# Patient Record
Sex: Male | Born: 1940 | Race: Black or African American | Hispanic: No | Marital: Married | Smoking: Never smoker
Health system: Southern US, Community
[De-identification: ages and names within clinical notes are randomized; demographics above are authoritative.]

## PROBLEM LIST (undated history)

## (undated) DIAGNOSIS — C801 Malignant (primary) neoplasm, unspecified: Secondary | ICD-10-CM

## (undated) DIAGNOSIS — F0280 Dementia in other diseases classified elsewhere without behavioral disturbance: Secondary | ICD-10-CM

## (undated) DIAGNOSIS — G309 Alzheimer's disease, unspecified: Secondary | ICD-10-CM

## (undated) DIAGNOSIS — I2510 Atherosclerotic heart disease of native coronary artery without angina pectoris: Secondary | ICD-10-CM

## (undated) DIAGNOSIS — N400 Enlarged prostate without lower urinary tract symptoms: Secondary | ICD-10-CM

## (undated) DIAGNOSIS — F0390 Unspecified dementia without behavioral disturbance: Secondary | ICD-10-CM

## (undated) DIAGNOSIS — I10 Essential (primary) hypertension: Secondary | ICD-10-CM

## (undated) HISTORY — PX: HERNIA REPAIR: SHX51

## (undated) HISTORY — PX: CORONARY ANGIOPLASTY WITH STENT PLACEMENT: SHX49

---

## 2015-06-27 ENCOUNTER — Observation Stay
Admission: EM | Admit: 2015-06-27 | Discharge: 2015-06-29 | Disposition: A | Discharge status: Home / Self Care | Payer: Medicare Other | Attending: Internal Medicine | Admitting: Internal Medicine

## 2015-06-27 ENCOUNTER — Emergency Department: Payer: Medicare Other

## 2015-06-27 ENCOUNTER — Encounter: Payer: Self-pay | Admitting: Urgent Care

## 2015-06-27 DIAGNOSIS — R0789 Other chest pain: Secondary | ICD-10-CM | POA: Diagnosis not present

## 2015-06-27 DIAGNOSIS — Z7982 Long term (current) use of aspirin: Secondary | ICD-10-CM | POA: Insufficient documentation

## 2015-06-27 DIAGNOSIS — R001 Bradycardia, unspecified: Secondary | ICD-10-CM | POA: Diagnosis not present

## 2015-06-27 DIAGNOSIS — E876 Hypokalemia: Secondary | ICD-10-CM | POA: Diagnosis not present

## 2015-06-27 DIAGNOSIS — Z8249 Family history of ischemic heart disease and other diseases of the circulatory system: Secondary | ICD-10-CM | POA: Diagnosis not present

## 2015-06-27 DIAGNOSIS — G8929 Other chronic pain: Secondary | ICD-10-CM | POA: Diagnosis not present

## 2015-06-27 DIAGNOSIS — R079 Chest pain, unspecified: Secondary | ICD-10-CM | POA: Diagnosis present

## 2015-06-27 DIAGNOSIS — Z880 Allergy status to penicillin: Secondary | ICD-10-CM | POA: Diagnosis not present

## 2015-06-27 DIAGNOSIS — Z79899 Other long term (current) drug therapy: Secondary | ICD-10-CM | POA: Diagnosis not present

## 2015-06-27 DIAGNOSIS — E785 Hyperlipidemia, unspecified: Secondary | ICD-10-CM | POA: Insufficient documentation

## 2015-06-27 DIAGNOSIS — I10 Essential (primary) hypertension: Secondary | ICD-10-CM | POA: Diagnosis not present

## 2015-06-27 DIAGNOSIS — F0390 Unspecified dementia without behavioral disturbance: Secondary | ICD-10-CM | POA: Insufficient documentation

## 2015-06-27 DIAGNOSIS — Z955 Presence of coronary angioplasty implant and graft: Secondary | ICD-10-CM | POA: Insufficient documentation

## 2015-06-27 DIAGNOSIS — R918 Other nonspecific abnormal finding of lung field: Secondary | ICD-10-CM | POA: Insufficient documentation

## 2015-06-27 DIAGNOSIS — M25559 Pain in unspecified hip: Secondary | ICD-10-CM | POA: Diagnosis not present

## 2015-06-27 DIAGNOSIS — N400 Enlarged prostate without lower urinary tract symptoms: Secondary | ICD-10-CM | POA: Insufficient documentation

## 2015-06-27 DIAGNOSIS — R55 Syncope and collapse: Secondary | ICD-10-CM | POA: Diagnosis not present

## 2015-06-27 DIAGNOSIS — I2510 Atherosclerotic heart disease of native coronary artery without angina pectoris: Secondary | ICD-10-CM | POA: Diagnosis not present

## 2015-06-27 DIAGNOSIS — J929 Pleural plaque without asbestos: Secondary | ICD-10-CM | POA: Diagnosis present

## 2015-06-27 HISTORY — DX: Unspecified dementia, unspecified severity, without behavioral disturbance, psychotic disturbance, mood disturbance, and anxiety: F03.90

## 2015-06-27 HISTORY — DX: Benign prostatic hyperplasia without lower urinary tract symptoms: N40.0

## 2015-06-27 HISTORY — DX: Atherosclerotic heart disease of native coronary artery without angina pectoris: I25.10

## 2015-06-27 HISTORY — DX: Essential (primary) hypertension: I10

## 2015-06-27 LAB — CBC
HCT: 39.3 % — ABNORMAL LOW (ref 40.0–52.0)
Hemoglobin: 13.2 g/dL (ref 13.0–18.0)
MCH: 30.3 pg (ref 26.0–34.0)
MCHC: 33.6 g/dL (ref 32.0–36.0)
MCV: 90.1 fL (ref 80.0–100.0)
Platelets: 143 10*3/uL — ABNORMAL LOW (ref 150–440)
RBC: 4.36 MIL/uL — ABNORMAL LOW (ref 4.40–5.90)
RDW: 13.6 % (ref 11.5–14.5)
WBC: 6.1 10*3/uL (ref 3.8–10.6)

## 2015-06-27 LAB — BASIC METABOLIC PANEL
Anion gap: 5 (ref 5–15)
BUN: 17 mg/dL (ref 6–20)
CO2: 29 mmol/L (ref 22–32)
Calcium: 8.9 mg/dL (ref 8.9–10.3)
Chloride: 104 mmol/L (ref 101–111)
Creatinine, Ser: 1.21 mg/dL (ref 0.61–1.24)
GFR calc Af Amer: >60 mL/min (ref >60)
GFR calc non Af Amer: 57 mL/min — ABNORMAL LOW (ref >60)
Glucose, Bld: 99 mg/dL (ref 65–99)
Potassium: 3.5 mmol/L (ref 3.5–5.1)
Sodium: 138 mmol/L (ref 135–145)

## 2015-06-27 LAB — TROPONIN I
Troponin I: <0.03 ng/mL (ref <0.031)
Troponin I: <0.03 ng/mL (ref <0.031)
Troponin I: <0.03 ng/mL (ref <0.031)
Troponin I: <0.03 ng/mL (ref <0.031)

## 2015-06-27 LAB — LIPID PANEL
Cholesterol: 153 mg/dL (ref 0–200)
HDL: 50 mg/dL (ref >40)
LDL Cholesterol: 96 mg/dL (ref 0–99)
Total CHOL/HDL Ratio: 3.1 RATIO
Triglycerides: 35 mg/dL (ref <150)
VLDL: 7 mg/dL (ref 0–40)

## 2015-06-27 IMAGING — CR DG CHEST 2V
2 series · 2 of 2 positions shown · non-contrast
Comparison: None.

CLINICAL DATA: Acute onset of retrosternal chest pain, radiating to
the left side of the chest and left arm. Initial encounter.

EXAM:
CHEST  2 VIEW

[chest pa]
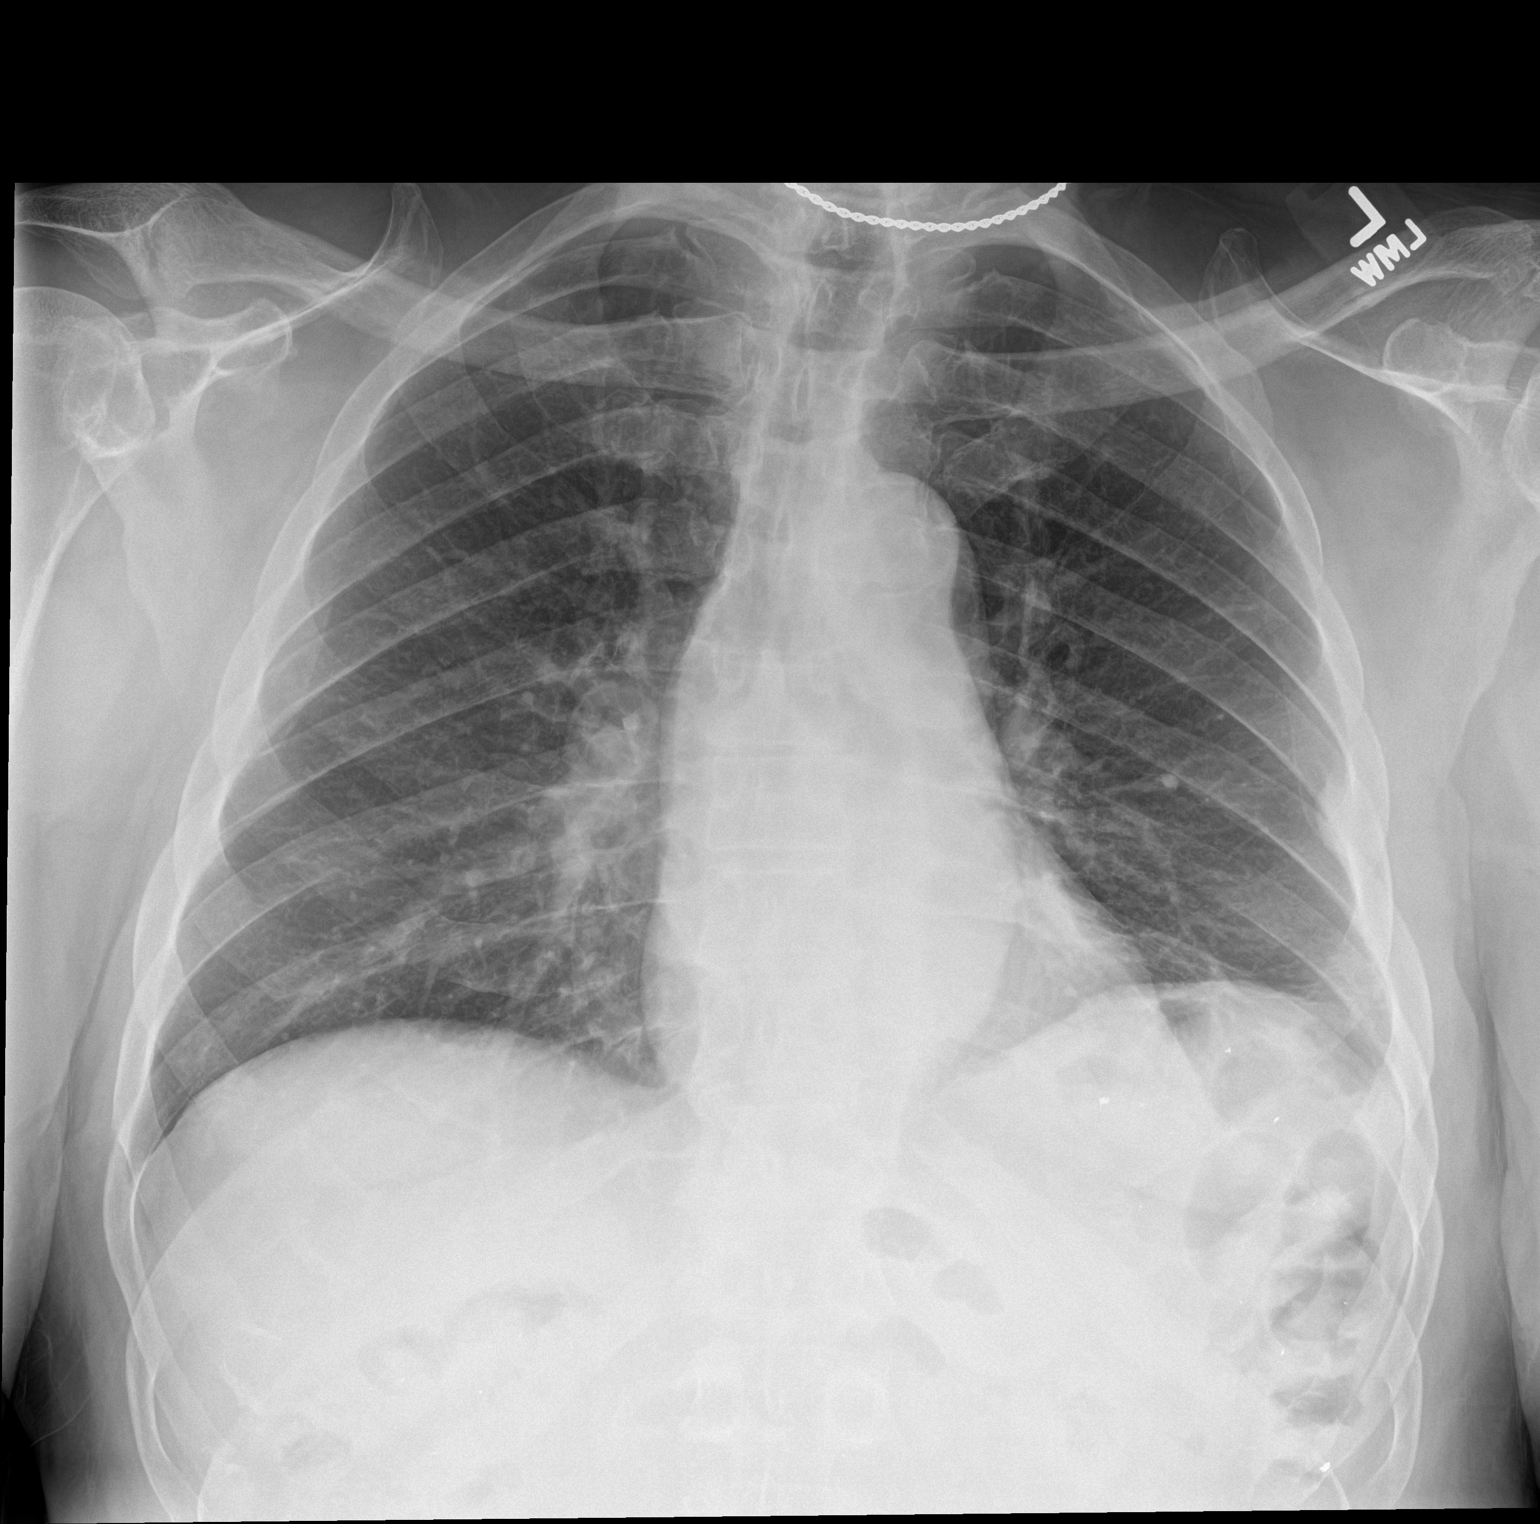

[chest lat]
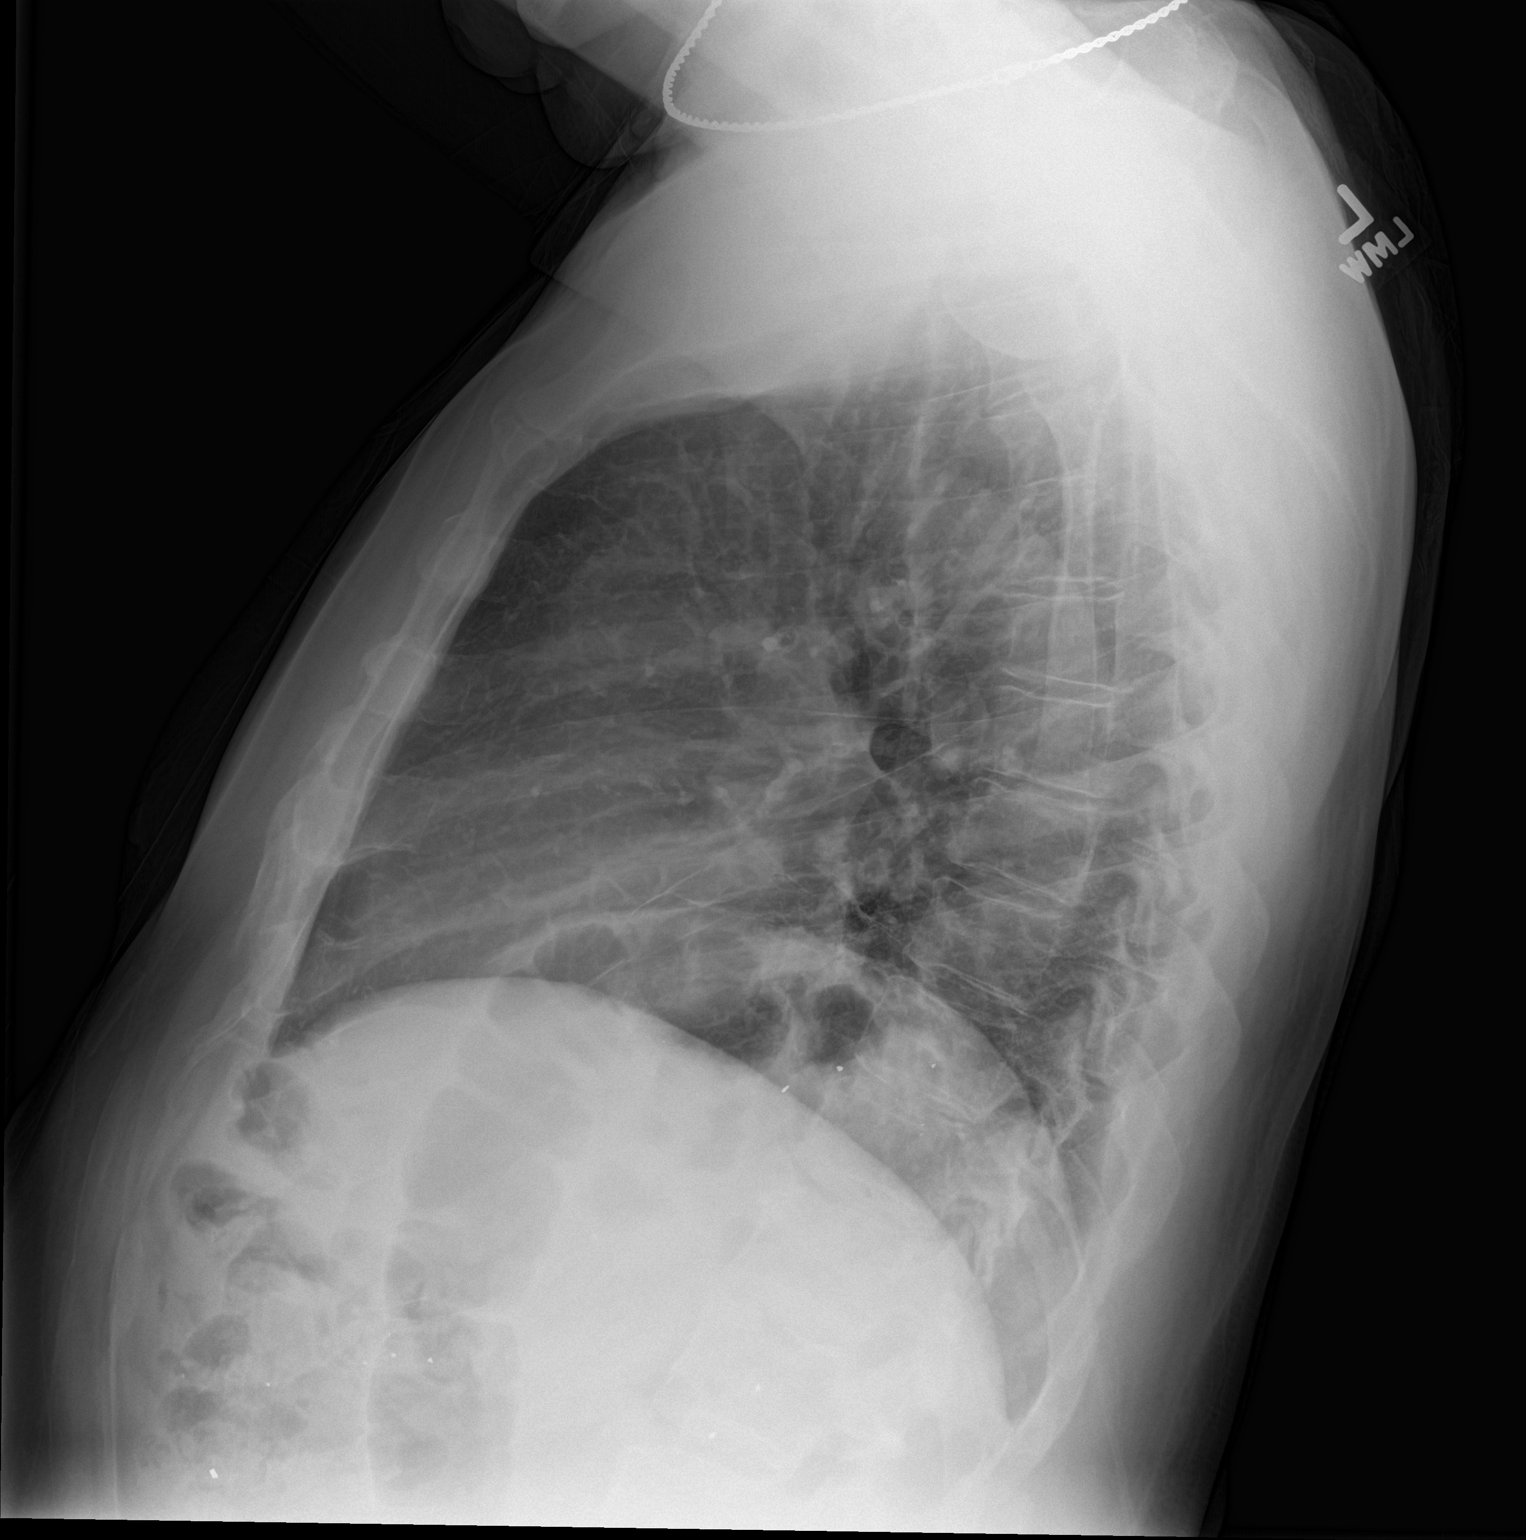

[2 of 2 positions shown; findings below may reference images not displayed]

FINDINGS: The lungs are well-aerated. There is no evidence of pleural effusion
or pneumothorax. Mild focal pleural thickening is noted at the left
midlung, of uncertain significance.

The heart is normal in size; the mediastinal contour is within
normal limits. No acute osseous abnormalities are seen.
IMPRESSION: Mild focal pleural thickening at the left midlung, of uncertain
significance. Would correlate for prior asbestos exposure, and
consider CT of the chest on an elective nonemergent basis, as deemed
clinically appropriate.

## 2015-06-27 MED ORDER — ASPIRIN EC 81 MG PO TBEC
81.0000 mg | DELAYED_RELEASE_TABLET | Freq: Every day | ORAL | Status: DC
  Administered 2015-06-27 – 2015-06-28 (×2): 81 mg via ORAL
  Filled 2015-06-27 (×2): qty 1

## 2015-06-27 MED ORDER — AMLODIPINE BESYLATE 10 MG PO TABS
10.0000 mg | ORAL_TABLET | Freq: Every day | ORAL | Status: DC
  Administered 2015-06-27 – 2015-06-28 (×2): 10 mg via ORAL
  Filled 2015-06-27 (×2): qty 1

## 2015-06-27 MED ORDER — POTASSIUM CHLORIDE CRYS ER 20 MEQ PO TBCR
20.0000 meq | EXTENDED_RELEASE_TABLET | Freq: Two times a day (BID) | ORAL | Status: DC
  Administered 2015-06-27 – 2015-06-28 (×2): 20 meq via ORAL
  Filled 2015-06-27 (×2): qty 1

## 2015-06-27 MED ORDER — TAMSULOSIN HCL 0.4 MG PO CAPS
0.4000 mg | ORAL_CAPSULE | Freq: Every day | ORAL | Status: DC
  Administered 2015-06-27 – 2015-06-28 (×2): 0.4 mg via ORAL
  Filled 2015-06-27 (×2): qty 1

## 2015-06-27 MED ORDER — PRAVASTATIN SODIUM 20 MG PO TABS
80.0000 mg | ORAL_TABLET | Freq: Every day | ORAL | Status: DC
  Administered 2015-06-27 – 2015-06-28 (×2): 80 mg via ORAL
  Filled 2015-06-27 (×2): qty 4

## 2015-06-27 MED ORDER — ASPIRIN 81 MG PO CHEW
324.0000 mg | CHEWABLE_TABLET | Freq: Once | ORAL | Status: AC
  Administered 2015-06-27: 324 mg via ORAL
  Filled 2015-06-27: qty 4

## 2015-06-27 MED ORDER — LISINOPRIL 20 MG PO TABS
40.0000 mg | ORAL_TABLET | Freq: Every day | ORAL | Status: DC
  Administered 2015-06-27 – 2015-06-28 (×2): 40 mg via ORAL
  Filled 2015-06-27 (×2): qty 2

## 2015-06-27 MED ORDER — DIPHENHYDRAMINE HCL 50 MG/ML IJ SOLN
INTRAMUSCULAR | Status: AC
  Administered 2015-06-27: 50 mg via INTRAVENOUS
  Filled 2015-06-27: qty 1

## 2015-06-27 MED ORDER — HYDRALAZINE HCL 20 MG/ML IJ SOLN: 10.0000 mg | Freq: Four times a day (QID) | INTRAMUSCULAR | Status: DC | PRN

## 2015-06-27 MED ORDER — NITROGLYCERIN 2 % TD OINT: 0.5000 [in_us] | TOPICAL_OINTMENT | Freq: Four times a day (QID) | TRANSDERMAL | Status: DC

## 2015-06-27 MED ORDER — HALOPERIDOL LACTATE 5 MG/ML IJ SOLN
5.0000 mg | Freq: Once | INTRAMUSCULAR | Status: AC
  Administered 2015-06-27: 5 mg via INTRAVENOUS
  Filled 2015-06-27: qty 1

## 2015-06-27 MED ORDER — DIPHENHYDRAMINE HCL 50 MG/ML IJ SOLN
50.0000 mg | Freq: Once | INTRAMUSCULAR | Status: AC
  Administered 2015-06-27: 50 mg via INTRAVENOUS

## 2015-06-27 MED ORDER — FUROSEMIDE 40 MG PO TABS
40.0000 mg | ORAL_TABLET | Freq: Every day | ORAL | Status: DC
  Administered 2015-06-27 – 2015-06-28 (×2): 40 mg via ORAL
  Filled 2015-06-27 (×2): qty 1

## 2015-06-27 MED ORDER — HALOPERIDOL LACTATE 5 MG/ML IJ SOLN
5.0000 mg | Freq: Once | INTRAMUSCULAR | Status: AC
  Administered 2015-06-27: 5 mg via INTRAVENOUS

## 2015-06-27 MED ORDER — ENOXAPARIN SODIUM 40 MG/0.4ML ~~LOC~~ SOLN
40.0000 mg | SUBCUTANEOUS | Status: DC
  Administered 2015-06-27 – 2015-06-28 (×2): 40 mg via SUBCUTANEOUS
  Filled 2015-06-27 (×2): qty 0.4

## 2015-06-27 MED ORDER — HALOPERIDOL LACTATE 5 MG/ML IJ SOLN
INTRAMUSCULAR | Status: AC
  Administered 2015-06-27: 5 mg via INTRAVENOUS
  Filled 2015-06-27: qty 1

## 2015-06-27 MED ORDER — METOPROLOL SUCCINATE ER 100 MG PO TB24
100.0000 mg | ORAL_TABLET | Freq: Every day | ORAL | Status: DC
  Administered 2015-06-27: 100 mg via ORAL
  Filled 2015-06-27 (×2): qty 1

## 2015-06-27 NOTE — Progress Notes (Signed)
[place] [facility]                                                                                                                                                                                            Patient Demographics   Alex [name], is a 75 y.o. male, DOB - 04-10-1940, [id number]  Admit date - 06/27/2015   Admitting Physician No admitting provider for patient encounter.  Outpatient Primary MD for the patient is No primary care provider on file.   LOS -   Subjective:Patient was admitted with chest pain and accelerated blood pressure. He was evaluated in the ER and wanted to go home. The ER physician commited him for danger to himself. He states that he is hungry denies any chest pain or shortness of breath     Review of Systems:   CONSTITUTIONAL: No documented fever. No fatigue, weakness. No weight gain, no weight loss.  EYES: No blurry or double vision.  ENT: No tinnitus. No postnasal drip. No redness of the oropharynx.  RESPIRATORY: No cough, no wheeze, no hemoptysis. No dyspnea.  CARDIOVASCULAR: Positive chest pain. No orthopnea. No palpitations. No syncope.  GASTROINTESTINAL: No nausea, no vomiting or diarrhea. No abdominal pain. No melena or hematochezia.  GENITOURINARY: No dysuria or hematuria.  ENDOCRINE: No polyuria or nocturia. No heat or cold intolerance.  HEMATOLOGY: No anemia. No bruising. No bleeding.  INTEGUMENTARY: No rashes. No lesions.  MUSCULOSKELETAL: No arthritis. No swelling. No gout.  NEUROLOGIC: No numbness, tingling, or ataxia. No seizure-type activity.  PSYCHIATRIC: No anxiety. No insomnia. No ADD.    Vitals:   Filed Vitals:   06/27/15 1030 06/27/15 1100 06/27/15 1307 06/27/15 1438  BP: 142/94 161/78 165/95 160/86  Pulse:  45 52 57  Resp:  12 11 12   Height:      Weight:      SpO2:  98% 97% 98%    Wt Readings from Last 3 Encounters:  06/27/15 92.987 kg (205 lb)    No intake or output data  in the 24 hours ending 06/27/15 1501  Physical Exam:   GENERAL: Pleasant-appearing in no apparent distress.  HEAD, EYES, EARS, NOSE AND THROAT: Atraumatic, normocephalic. Extraocular muscles are intact. Pupils equal and reactive to light. Sclerae anicteric. No conjunctival injection. No oro-pharyngeal erythema.  NECK: Supple. There is no jugular venous distention. No bruits, no lymphadenopathy, no thyromegaly.  HEART: Regular rate and rhythm,. No murmurs, no rubs, no clicks.  LUNGS: Clear to auscultation bilaterally. No rales or rhonchi. No wheezes.  ABDOMEN: Soft, flat, nontender, nondistended. Has good bowel sounds. No hepatosplenomegaly appreciated.  EXTREMITIES: No evidence of any cyanosis, clubbing, or peripheral edema.  +2 pedal and radial pulses bilaterally.  NEUROLOGIC: The patient is alert, awake, and oriented x3 with no focal motor or sensory deficits appreciated bilaterally.  SKIN: Moist and warm with no rashes appreciated.  Psych: Not anxious, depressed LN: No inguinal LN enlargement    Antibiotics   Anti-infectives    None      Medications   Scheduled Meds: . haloperidol lactate  5 mg Intravenous Once   Continuous Infusions:  PRN Meds:.   Data Review:   Micro Results No results found for this or any previous visit (from the past 240 hour(s)).  Radiology Reports Dg Chest 2 View  06/27/2015  CLINICAL DATA:  Acute onset of retrosternal chest pain, radiating to the left side of the chest and left arm. Initial encounter. EXAM: CHEST  2 VIEW COMPARISON:  None. FINDINGS: The lungs are well-aerated. There is no evidence of pleural effusion or pneumothorax. Mild focal pleural thickening is noted at the left midlung, of uncertain significance. The heart is normal in size; the mediastinal contour is within normal limits. No acute osseous abnormalities are seen. IMPRESSION: Mild focal pleural thickening at the left midlung, of uncertain significance. Would correlate for prior  asbestos exposure, and consider CT of the chest on an elective nonemergent basis, as deemed clinically appropriate. Electronically Signed   By: Garald Balding M.D.   On: 06/27/2015 03:00     CBC  Recent Labs Lab 06/27/15 0240  WBC 6.1  HGB 13.2  HCT 39.3*  PLT 143*  MCV 90.1  MCH 30.3  MCHC 33.6  RDW 13.6    Chemistries   Recent Labs Lab 06/27/15 0240  NA 138  K 3.5  CL 104  CO2 29  GLUCOSE 99  BUN 17  CREATININE 1.21  CALCIUM 8.9   ------------------------------------------------------------------------------------------------------------------ estimated creatinine clearance is 61.3 mL/min (by C-G formula based on Cr of 1.21). ------------------------------------------------------------------------------------------------------------------ No results for input(s): HGBA1C in the last 72 hours. ------------------------------------------------------------------------------------------------------------------ No results for input(s): CHOL, HDL, LDLCALC, TRIG, CHOLHDL, LDLDIRECT in the last 72 hours. ------------------------------------------------------------------------------------------------------------------ No results for input(s): TSH, T4TOTAL, T3FREE, THYROIDAB in the last 72 hours.  Invalid input(s): FREET3 ------------------------------------------------------------------------------------------------------------------ No results for input(s): VITAMINB12, FOLATE, FERRITIN, TIBC, IRON, RETICCTPCT in the last 72 hours.  Coagulation profile No results for input(s): INR, PROTIME in the last 168 hours.  No results for input(s): DDIMER in the last 72 hours.  Cardiac Enzymes  Recent Labs Lab 06/27/15 0240  TROPONINI <0.03   ------------------------------------------------------------------------------------------------------------------ Invalid input(s): POCBNP    Assessment & Plan   1). Chest pain Cardiac enzymes negative patient scheduled to have a  stress test in the morning. If negative can be discharged home tomorrow    2). Uncontrolled hypertension - Blood pressure was 171/90 on arrival improved - Continue amlodipine, metoprolol and lisinopril. - When necessary hydralazine for systolic blood pressure greater than 180. -May need adjustment on discharge  3). Bradycardia - Due to medication patient asymptomatic with that continue to monitor  4). Left Pleural Thickening - Follow up with PCP as outpatient for a possible CT scan   Code Status History    This patient does not have a recorded code status. Please follow your organizational policy for patients in this situation.           ConsultsNone DVT Prophylaxis  Lovenox   Lab Results  Component Value Date   PLT 143* 06/27/2015     Time Spent in minutes  58min  Greater than 50% of time spent in care coordination and counseling patient regarding the condition and plan of care.   Dustin Flock M.D on 06/27/2015 at 3:01 PM  Between 7am to 6pm - Pager - [phone]  After 6pm go to [email or web address] - password [email or web address] [facility]   Office  [phone]

## 2015-06-27 NOTE — ED Notes (Signed)
Pt observed sitting up in bed - NAD assessed  He will be waiting for a bed to be admitted  IVC

## 2015-06-27 NOTE — ED Notes (Signed)
Vegetarian meal provided - Pt observed with no unusual behavior  Appropriate to stimulation  No verbalized needs or concerns at this time  NAD assessed  Continue to monitor

## 2015-06-27 NOTE — ED Notes (Signed)
BEHAVIORAL HEALTH ROUNDING Patient sleeping: No. Patient alert and oriented: yes Behavior appropriate: Yes.  ; If no, describe:  Nutrition and fluids offered: yes Toileting and hygiene offered: Yes  Sitter present: q15 minute observations and security  monitoring Law enforcement present: Yes  ODS  

## 2015-06-27 NOTE — ED Notes (Signed)
Patient in bed sleeping at this time. 

## 2015-06-27 NOTE — Consult Note (Signed)
Overhead page Silver/Security alert for missing patient; patient is dealing with 'sundowners'/dementia related issues.  Security found patient wandering halls and returned him to room.  He is confused, disoriented and wants to leave [facility].  Nursing Supervisor contacted family for assistance.

## 2015-06-27 NOTE — ED Notes (Signed)

## 2015-06-27 NOTE — ED Notes (Addendum)
Family (grandson) at bedside., wanting staff to know that pt is vegetarian, pt grandson left list of numbers for family   Daughter Olegario Shearer Lindaman) [phone] Wife [name] lewis) [phone] Kristian Covey) [phone]

## 2015-06-27 NOTE — Progress Notes (Signed)
Pt. Arrived via stretcher was able to transfer self to bed without assistance. PT alert and oriented. Tele applied, verified with Janett Billow, CNA. General room orientation given. Pt. given instruction on how to use call bell and ascom system. Sitter at bedside. No behaviors noted. Skin assessment completed with Baldo Ash, RN. No skin issues noted. Pt. Mod assist. Will continue to monitor pt.

## 2015-06-27 NOTE — ED Notes (Signed)
Spoke with his daughter - POA  Update provided to her including the plan for admission - repeat troponins and what troponins are - daughter receptive to plan of care and I encouraged her to call at any time for updates

## 2015-06-27 NOTE — ED Notes (Signed)
Pt uprite on stretcher in exam room with no distress noted; pt reports onset mid CP radiating into left side and arm with no accomp symptoms; st hx of same with card stents; resp even/unlab, lungs clear, apical audible and regular; card monitor in place; male friend at bedside

## 2015-06-27 NOTE — ED Notes (Signed)
Pt brother at bedside.

## 2015-06-27 NOTE — Progress Notes (Signed)
Pt. Daughter arrived, pt. Allowed telemetry to be re-applied. Pt. Now in bed with sitter at bedside. Will continue to monitor pt.

## 2015-06-27 NOTE — Progress Notes (Signed)
Patient arrived to 2A. Vitals taken and tele verified with Gildardo Pounds, RN and Bertrum Sol, NT. IVC sitter is in room with patient. Will give report to night shift.

## 2015-06-27 NOTE — ED Notes (Signed)
Patient agitated and verbally aggressive in room, Dr. Owens Shark to IVC patient and placed medication orders.

## 2015-06-27 NOTE — ED Notes (Signed)
Report given to Kim, RN.

## 2015-06-27 NOTE — ED Notes (Signed)
Pt removed from ER room 7 to ER room 20, pt placed on cardiac monitor by this tech, tech will continue to monitor at this time.

## 2015-06-27 NOTE — H&P (Addendum)
[place] [facility] [place] NAME: Alex [name]    MR#:  [id number]  DATE OF BIRTH:  03/29/1941  DATE OF ADMISSION:  06/27/2015  PRIMARY CARE PHYSICIAN: No primary care provider on file.   REQUESTING/REFERRING PHYSICIAN: Dr Owens Shark  CHIEF COMPLAINT:   Chief Complaint  Patient presents with  . Chest Pain    HISTORY OF PRESENT ILLNESS: Alex [name]  is a 75 y.o. male with a known history of Coronary artery disease status post 3 stents with last stent placed about 4 years ago, hypertension and dementia. He is able to provide history in collaboration with his wife. He states that he developed chest pain while he was laying in bed doing nothing. Chest pain was retrosternal and radiated at 5/10 at its worse. There was no radiation and chest pain felt dull. Chest was continuous and has not identified aggravating or relieving factor. Wife states that at that time blood pressure was 170/86 which is high for him. Pulse was also 48 at that time. Chest pain lasted for a total of 2 hours before resolving spontaneously. Patient denies any associated symptoms and status: He denies any nausea, vomiting, abdominal pain, shortness of breath, cough, syncope, palpitations, dizziness or confusion. He denies any trauma to the chest and he is not bleeding from any orifice. At this time, he is chest pain-free. He denies any fever, recent travel or any changes in bowel or urine habits. He does not drink alcohol, smoke cigarettes or use recreational drugs.  On arrival in the ED, blood pressure was 171/90 and pulse was 48. CBC and CMP are unremarkable and troponin is negative. EKG showed sinus bradycardia at 50 and chest x-ray was negative. Patient was given aspirin in the ED and will be admitted for observation due to chest pain. He is full code  PAST MEDICAL HISTORY:   Past Medical History  Diagnosis Date  . Hypertension   . CAD (coronary artery disease)   . Dementia    . BPH (benign prostatic hyperplasia)     PAST SURGICAL HISTORY:  Past Surgical History  Procedure Laterality Date  . Coronary angioplasty with stent placement    . Hernia repair      SOCIAL HISTORY:  Social History  Substance Use Topics  . Smoking status: Never Smoker   . Smokeless tobacco: Not on file  . Alcohol Use: No    FAMILY HISTORY:  Family History  Problem Relation Age of Onset  . Hypertension Mother      DRUG ALLERGIES:  Allergies  Allergen Reactions  . Penicillins Other (See Comments)    Unknown reaction, childhood     REVIEW OF SYSTEMS:   CONSTITUTIONAL: No fever, fatigue or weakness.  EYES: No blurred or double vision.  EARS, NOSE, AND THROAT: No tinnitus or ear pain.  RESPIRATORY: No cough, shortness of breath, wheezing or hemoptysis.  CARDIOVASCULAR: As in history of present illness GASTROINTESTINAL: No nausea, vomiting, diarrhea or abdominal pain.  GENITOURINARY: No dysuria, hematuria.  ENDOCRINE: No polyuria, nocturia,  HEMATOLOGY: No anemia, easy bruising or bleeding SKIN: No rash or lesion. MUSCULOSKELETAL: No joint pain or arthritis.   NEUROLOGIC: No tingling, numbness, weakness.  PSYCHIATRY: No anxiety or depression.   MEDICATIONS AT HOME:  Prior to Admission medications   Not on File   Amlodipine 10mg  daily Aspirin 81 mg daily Lasix 40 Motrin daily Ibuprofen 800 mg every 8 hours when necessary Lisinopril 40 g daily Metoprolol 100 mg every 24  hours Potassium chloride 20 mEq twice a day Pravastatin 80 mg daily Tamsulosin 0.4 mg daily   PHYSICAL EXAMINATION:   VITAL SIGNS: Blood pressure 140/79, pulse 48, resp. rate 17, height 6\' 2"  (1.88 m), weight 92.987 kg (205 lb), SpO2 100 %.  GENERAL:  75 y.o.-year-old patient lying in the bed with no acute distress. Alert and oriented x 3. EYES: Pupils equal, round, reactive to light and accommodation. No scleral icterus. Extraocular muscles intact.  HEENT: Head atraumatic, normocephalic.  Oropharynx and nasopharynx clear.  NECK:  Supple, no jugular venous distention. No thyroid enlargement, no tenderness.  LUNGS: Normal breath sounds bilaterally, no wheezing, rales,rhonchi or crepitation. No use of accessory muscles of respiration.  CARDIOVASCULAR: Bradycardia, S1, S2 normal. No murmurs, rubs, or gallops.  ABDOMEN: Soft, nontender, nondistended. Bowel sounds present. No organomegaly or mass.  EXTREMITIES: No pedal edema, cyanosis, or clubbing.  NEUROLOGIC: Cranial nerves II through XII are intact. Muscle strength 5/5 in all extremities. Sensation intact. Gait not checked.   SKIN: No obvious rash, lesion, or ulcer.   LABORATORY PANEL:   CBC  Recent Labs Lab 06/27/15 0240  WBC 6.1  HGB 13.2  HCT 39.3*  PLT 143*  MCV 90.1  MCH 30.3  MCHC 33.6  RDW 13.6   ------------------------------------------------------------------------------------------------------------------  Chemistries   Recent Labs Lab 06/27/15 0240  NA 138  K 3.5  CL 104  CO2 29  GLUCOSE 99  BUN 17  CREATININE 1.21  CALCIUM 8.9   ------------------------------------------------------------------------------------------------------------------ estimated creatinine clearance is 61.3 mL/min (by C-G formula based on Cr of 1.21). ------------------------------------------------------------------------------------------------------------------ No results for input(s): TSH, T4TOTAL, T3FREE, THYROIDAB in the last 72 hours.  Invalid input(s): FREET3   Coagulation profile No results for input(s): INR, PROTIME in the last 168 hours. ------------------------------------------------------------------------------------------------------------------- No results for input(s): DDIMER in the last 72 hours. -------------------------------------------------------------------------------------------------------------------  Cardiac Enzymes  Recent Labs Lab 06/27/15 0240  TROPONINI <0.03    ------------------------------------------------------------------------------------------------------------------ Invalid input(s): POCBNP  ---------------------------------------------------------------------------------------------------------------  Urinalysis No results found for: COLORURINE, APPEARANCEUR, LABSPEC, PHURINE, GLUCOSEU, HGBUR, BILIRUBINUR, KETONESUR, PROTEINUR, UROBILINOGEN, NITRITE, LEUKOCYTESUR   RADIOLOGY: Dg Chest 2 View  06/27/2015  CLINICAL DATA:  Acute onset of retrosternal chest pain, radiating to the left side of the chest and left arm. Initial encounter. EXAM: CHEST  2 VIEW COMPARISON:  None. FINDINGS: The lungs are well-aerated. There is no evidence of pleural effusion or pneumothorax. Mild focal pleural thickening is noted at the left midlung, of uncertain significance. The heart is normal in size; the mediastinal contour is within normal limits. No acute osseous abnormalities are seen. IMPRESSION: Mild focal pleural thickening at the left midlung, of uncertain significance. Would correlate for prior asbestos exposure, and consider CT of the chest on an elective nonemergent basis, as deemed clinically appropriate. Electronically Signed   By: Garald Balding M.D.   On: 06/27/2015 03:00    EKG: Orders placed or performed during the hospital encounter of 06/27/15  . EKG 12-Lead  . EKG 12-Lead  . ED EKG within 10 minutes  . ED EKG within 10 minutes    ASSESSMENT  Principal Problem:   Chest pain Active Problems:   Uncontrolled hypertension   Bradycardia   Pleural thickening  PLAN   1). Chest pain - Patient presents with a retrosternal continuous chest pain that is nonradiating and is not associated with other symptoms. Patient is currently chest pain-free. Due to his history of coronary artery disease status post 3 stents, he is admitted for observation. - EKG only showed sinus  bradycardia at 50 and troponin is negative. Chest x-ray is also negative. -  Patient received aspirin and we have started patient on nitroglycerin. Continue metoprolol, Pravastatin and lisinopril. - Cycle troponin. If 3 sets are negative, patient will obtain a stress test prior to discharge. He is currently nothing by mouth - Check lipid panel and A1c.  2). Uncontrolled hypertension - Blood pressure was 171/90 on arrival but this is improved to 145/82. - Continue amlodipine, metoprolol and lisinopril. - When necessary hydralazine for systolic blood pressure greater than 180.  3). Bradycardia - Patient is on 100 mg daily of metoprolol. This is likely the etiology of bradycardia. Asymptomatic - Continue to monitor heart rate. I have reassured patient and his wife  4). Left Pleural Thickening - Follow up with PCP as outpatient for a possible CT scan  All the records are reviewed and case discussed with ED provider. Management plans discussed with the patient, family and they are in agreement.  CODE STATUS: Code Status History    This patient does not have a recorded code status. Please follow your organizational policy for patients in this situation.      I have independently reviewed all EKG and chest x-ray data  VTE prophylaxis: Lovenox if no contraindications and patient at low risk for bleeding. SCD's and progressive ambulation if patient has contraindications to anticoagulants. No DVT prophylaxis if patient presently receiving therapeutic anticoagulation or is at significant risk of bleeding for which the risk of anticoagulation outweigh the potential benefits.  Vaccinations: Pneumonia & flu vaccine per hospital protocol Prevention: Will proceed with conservative measures for the prevention of delirium in patients older than 65. Fall precautions and 1:1 sitter as needed per hospital protocol.  TOTAL TIME TAKING CARE OF THIS PATIENT: 40 minutes.    Sylvan Cheese M.D on 06/27/2015 at 4:38 AM  Between 7am to 6pm - Pager - [phone]  After 6pm [name] to  [email or web address] - password [email or web address] [facility]  [facility]  Office  [phone]  CC: Primary care physician; No primary care provider on file.

## 2015-06-27 NOTE — Progress Notes (Signed)
Pt. Pleasantly confused, will not stay in room, keeps exit seeking, he does have a sitter. Refuses to wear his tele, central tele notified. Daughter Ms. Sunga called and asked to come to hospital to speak with pt. She was also given room phone number to call.  MD paged to inform of pt's refusal to wear telemetry, awaiting call back. Will continue to monitor pt.

## 2015-06-27 NOTE — ED Notes (Signed)
Report called to [facility] RN on 2A

## 2015-06-27 NOTE — ED Notes (Signed)
Patient attempted to leave ER with IV still in arm and cardiac leads still attached. Patient escorted back to room by Dr. Owens Shark and [employer].

## 2015-06-27 NOTE — ED Notes (Addendum)
Admitting MD at the bedside for pt evaluation.  

## 2015-06-27 NOTE — ED Notes (Signed)
Patient presents with a 2 hour c/o retrosternal CP with (+) radiation into LEFT side of chest. Patient denies N/V, SOB, and diaphoresis. Patient reports that he is having significant pain in his LUE. Of note, patient's family member reporting that patient has Alzheimer's, however states, "You wouldn't know it" - concerned that HPI may be limited due to his dementia. Patient CAO x 4 in triage.

## 2015-06-27 NOTE — ED Provider Notes (Signed)
[facility] Emergency Department Provider Note  ____________________________________________  Time seen: 2:20 AM  I have reviewed the triage vital signs and the nursing notes.   HISTORY  Chief Complaint Chest Pain    HPI Alex [name] is a 75 y.o. male with history of coronary artery disease with 3 cardiac stent placements, hypertension presents with two-hour history of retrosternal chest pain leading to the left side of his chest that has since resolved. Patient states that the pain lasted approximately an hour and was "severe". Patient denies any dyspnea associated with the pain no diaphoresis nausea or vomiting. Patient's family member at bedside stated that she checked his pulse at home and noted it to be 48 patient's pulse on arrival to emergency department was 50.     Past Medical History  Diagnosis Date  . Hypertension   . CAD (coronary artery disease)     There are no active problems to display for this patient.   Past Surgical History  Procedure Laterality Date  . Coronary angioplasty with stent placement    . Hernia repair      No current outpatient prescriptions on file.  Allergies Penicillins  No family history on file.  Social History Social History  Substance Use Topics  . Smoking status: Never Smoker   . Smokeless tobacco: None  . Alcohol Use: No    Review of Systems  Constitutional: Negative for fever. Eyes: Negative for visual changes. ENT: Negative for sore throat. Cardiovascular: Positive for chest pain. Respiratory: Negative for shortness of breath. Gastrointestinal: Negative for abdominal pain, vomiting and diarrhea. Genitourinary: Negative for dysuria. Musculoskeletal: Negative for back pain. Skin: Negative for rash. Neurological: Negative for headaches, focal weakness or numbness.  10-point ROS otherwise negative.  ____________________________________________   PHYSICAL EXAM:  VITAL SIGNS: ED Triage  Vitals  Enc Vitals Group     BP 06/27/15 0238 171/90 mmHg     Pulse --      Resp 06/27/15 0238 15     Temp --      Temp src --      SpO2 --      Weight 06/27/15 0211 205 lb (92.987 kg)     Height 06/27/15 0211 6\' 2"  (1.88 m)     Head Cir --      Peak Flow --      Pain Score 06/27/15 0211 0     Pain Loc --      Pain Edu? --      Excl. in [place]? --     Constitutional: Alert and oriented. Well appearing and in no distress. Eyes: Conjunctivae are normal. PERRL. Normal extraocular movements. ENT   Head: Normocephalic and atraumatic.   Nose: No congestion/rhinnorhea.   Mouth/Throat: Mucous membranes are moist.   Neck: No stridor. Hematological/Lymphatic/Immunilogical: No cervical lymphadenopathy. Cardiovascular:Bradycardia. Normal and symmetric distal pulses are present in all extremities. No murmurs, rubs, or gallops. Respiratory: Normal respiratory effort without tachypnea nor retractions. Breath sounds are clear and equal bilaterally. No wheezes/rales/rhonchi. Gastrointestinal: Soft and nontender. No distention. There is no CVA tenderness. Genitourinary: deferred Musculoskeletal: Nontender with normal range of motion in all extremities. No joint effusions.  No lower extremity tenderness nor edema. Neurologic:  Normal speech and language. No gross focal neurologic deficits are appreciated. Speech is normal.  Skin:  Skin is warm, dry and intact. No rash noted. Psychiatric: Mood and affect are normal. Speech and behavior are normal. Patient exhibits appropriate insight and judgment.  ____________________________________________    LABS (  pertinent positives/negatives)  Labs Reviewed  BASIC METABOLIC PANEL - Abnormal; Notable for the following:    GFR calc non Af Amer 57 (*)    All other components within normal limits  CBC - Abnormal; Notable for the following:    RBC 4.36 (*)    HCT 39.3 (*)    Platelets 143 (*)    All other components within normal limits   TROPONIN I     ____________________________________________   EKG  ED ECG REPORT I, [place] N BROWN, the attending physician, personally viewed and interpreted this ECG.   Date: 06/27/2015  EKG Time: 9:10 PM  Rate: 50  Rhythm: Sinus bradycardia  Axis: Normal  Intervals: Normal  ST&T Change: None   ____________________________________________    RADIOLOGY  DG Chest 2 View (Final result) Result time: 06/27/15 03:00:28   Final result by Rad Results In Interface (06/27/15 03:00:28)   Narrative:   CLINICAL DATA: Acute onset of retrosternal chest pain, radiating to the left side of the chest and left arm. Initial encounter.  EXAM: CHEST 2 VIEW  COMPARISON: None.  FINDINGS: The lungs are well-aerated. There is no evidence of pleural effusion or pneumothorax. Mild focal pleural thickening is noted at the left midlung, of uncertain significance.  The heart is normal in size; the mediastinal contour is within normal limits. No acute osseous abnormalities are seen.  IMPRESSION: Mild focal pleural thickening at the left midlung, of uncertain significance. Would correlate for prior asbestos exposure, and consider CT of the chest on an elective nonemergent basis, as deemed clinically appropriate.   Electronically Signed By: Garald Balding M.D. On: 06/27/2015 03:00         INITIAL IMPRESSION / ASSESSMENT AND PLAN / ED COURSE  Pertinent labs & imaging results that were available during my care of the patient were reviewed by me and considered in my medical decision making (see chart for details).  Patient discussed with Dr. Pia Mau for hospital admission. Patient in agreement with plan for admission for further cardiac evaluation and management.  ----------------------------------------- 7:52 AM on 06/27/2015 -----------------------------------------  Patient became very irate and combative with staff attempting to walk out of the emergency  department with IV and hand wearing a gown. I was able to speak to the patient and de-escalate the situation return the patient to the room. However patient quickly became irate again attempting to leave. I spoke with the patient at length to assess capacity to to leave [place]. Patient is only oriented to self patient does not know what year or month it is. Patient also does not know the current present in the [place] is. Patient could not recall speaking with the earlier evaluation or speaking with the admitting physician who had spoken with him approximately 1-1/2 hours before this encounter. At this time I believe that the patient does not have the capacity to leave the emergency department [place] as such patient will be involuntarily committed.  ____________________________________________   FINAL CLINICAL IMPRESSION(S) / ED DIAGNOSES  Final diagnoses:  Chest pain, unspecified chest pain type      Gregor Hams, MD 06/27/15 [phone]

## 2015-06-27 NOTE — ED Notes (Signed)
Pt daughter at bedside

## 2015-06-27 NOTE — ED Notes (Addendum)
Patient placed in bed, cardiac monitor reapplied. Patient family member and caregiver at bedside, [employer] and [employer] officer outside of patients room.

## 2015-06-27 NOTE — ED Notes (Signed)
Pt provided urinal.

## 2015-06-28 ENCOUNTER — Observation Stay: Payer: Medicare Other

## 2015-06-28 LAB — URINALYSIS COMPLETE WITH MICROSCOPIC (ARMC ONLY)
Bacteria, UA: NONE SEEN
Bilirubin Urine: NEGATIVE
Glucose, UA: NEGATIVE mg/dL
Hgb urine dipstick: NEGATIVE
Ketones, ur: NEGATIVE mg/dL
Leukocytes, UA: NEGATIVE
Nitrite: NEGATIVE
Protein, ur: NEGATIVE mg/dL
Specific Gravity, Urine: 1.009 (ref 1.005–1.030)
Squamous Epithelial / LPF: NONE SEEN
pH: 6 (ref 5.0–8.0)

## 2015-06-28 LAB — NM MYOCAR MULTI W/SPECT W/WALL MOTION / EF
Estimated workload: 7.4 METS
Exercise duration (min): 6 min
Exercise duration (sec): 18 s
LV dias vol: 63 mL (ref 62–150)
LV sys vol: 29 mL
Peak HR: 95 {beats}/min
Rest HR: 53 {beats}/min
SDS: 1
SRS: 6
SSS: 1
TID: 0.83

## 2015-06-28 LAB — GLUCOSE, CAPILLARY: Glucose-Capillary: 108 mg/dL — ABNORMAL HIGH (ref 65–99)

## 2015-06-28 LAB — HEMOGLOBIN A1C: Hgb A1c MFr Bld: 5.4 % (ref 4.0–6.0)

## 2015-06-28 IMAGING — NM NM MYOCAR MULTI W/SPECT W/WALL MOTION & EF
11 series · 60 of 60 positions shown · non-contrast
Comparison: none

[Series 1000: raw stress (recon - noac ) · 4.8mm · 4.80mm/px · 5 of 33 frames shown]
[frame 3/33]
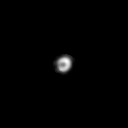
[frame 9/33]
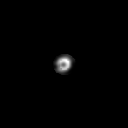
[frame 14/33]
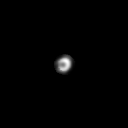
[frame 20/33]
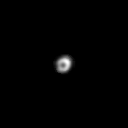
[frame 25/33]
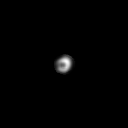

[Series 1000: raw stress (recon - ac ) · 4.8mm · 4.80mm/px · 6 of 33 frames shown]
[frame 3/33]
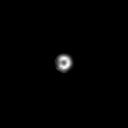
[frame 9/33]
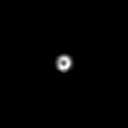
[frame 14/33]
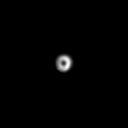
[frame 20/33]
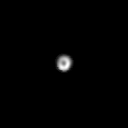
[frame 25/33]
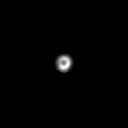
[frame 31/33]
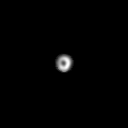

[Series 1000: raw rest (recon - ac ) · 4.8mm · 4.80mm/px · 5 of 30 frames shown]
[frame 3/30]
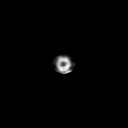
[frame 8/30]
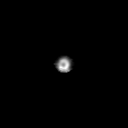
[frame 13/30]
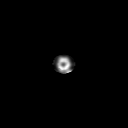
[frame 18/30]
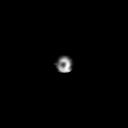
[frame 28/30]
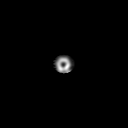

[Series 1000: raw stress-gated · 4.80mm/px · 6 of 544 frames shown]
[frame 46/544]
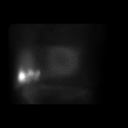
[frame 136/544]
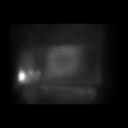
[frame 227/544]
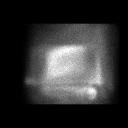
[frame 318/544]
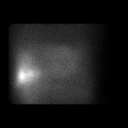
[frame 408/544]
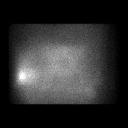
[frame 499/544]
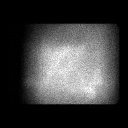

[Series 1000: raw stress_dc · 4.80mm/px · 5 of 68 frames shown]
[frame 6/68]
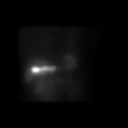
[frame 17/68]
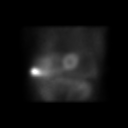
[frame 29/68]
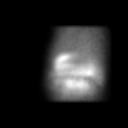
[frame 51/68]
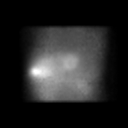
[frame 63/68]
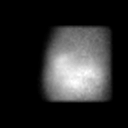

[Series 1000: raw stress-gated (recon) · 4.8mm · 4.80mm/px · 6 of 248 frames shown]
[frame 21/248]
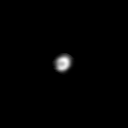
[frame 62/248]
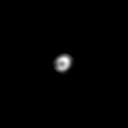
[frame 104/248]
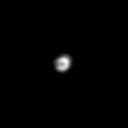
[frame 145/248]
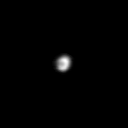
[frame 186/248]
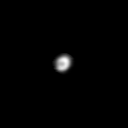
[frame 228/248]
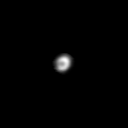

[Series 1000: raw stress-gated_dc · 4.80mm/px · 5 of 544 frames shown]
[frame 46/544]
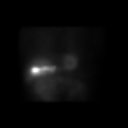
[frame 136/544]
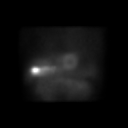
[frame 318/544]
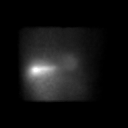
[frame 408/544]
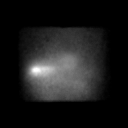
[frame 499/544]
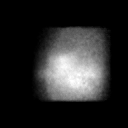

[Series 1000: raw rest_dc · 4.80mm/px · 6 of 68 frames shown]
[frame 6/68]
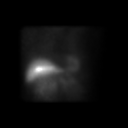
[frame 17/68]
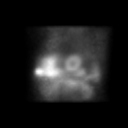
[frame 29/68]
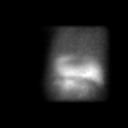
[frame 40/68]
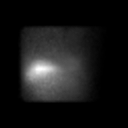
[frame 51/68]
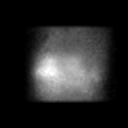
[frame 63/68]
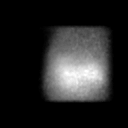

[Series 1000: raw rest (recon - noac ) · 4.8mm · 4.80mm/px · 5 of 35 frames shown]
[frame 3/35]
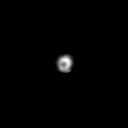
[frame 15/35]
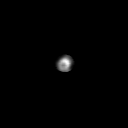
[frame 21/35]
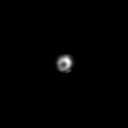
[frame 27/35]
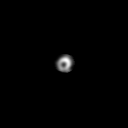
[frame 33/35]
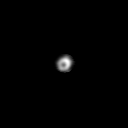

[Series 1000: raw rest · 4.80mm/px · 6 of 68 frames shown]
[frame 6/68]
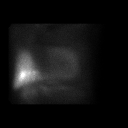
[frame 17/68]
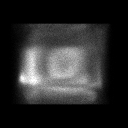
[frame 29/68]
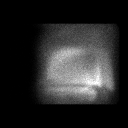
[frame 40/68]
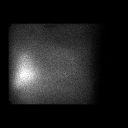
[frame 51/68]
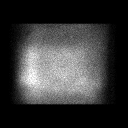
[frame 63/68]
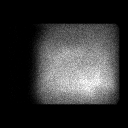

[Series 1000: raw stress · 4.80mm/px · 5 of 68 frames shown]
[frame 17/68]
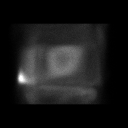
[frame 29/68]
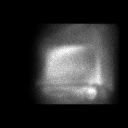
[frame 40/68]
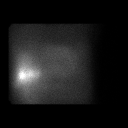
[frame 51/68]
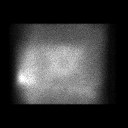
[frame 63/68]
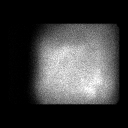

[60 of 60 positions shown; findings below may reference images not displayed]

Canned report from images found in remote index.

Refer to host system for actual result text.

## 2015-06-28 MED ORDER — TECHNETIUM TC 99M SESTAMIBI GENERIC - CARDIOLITE
30.0000 | Freq: Once | INTRAVENOUS | Status: AC | PRN
  Administered 2015-06-28: 32.91 via INTRAVENOUS

## 2015-06-28 MED ORDER — METOPROLOL SUCCINATE ER 50 MG PO TB24: 50.0000 mg | ORAL_TABLET | Freq: Every day | ORAL | Status: DC

## 2015-06-28 MED ORDER — SODIUM CHLORIDE 0.9 % IV SOLN
INTRAVENOUS | Status: DC
  Administered 2015-06-28 – 2015-06-29 (×2): via INTRAVENOUS

## 2015-06-28 MED ORDER — DONEPEZIL HCL 5 MG PO TABS
10.0000 mg | ORAL_TABLET | Freq: Every day | ORAL | Status: DC
  Administered 2015-06-28: 10 mg via ORAL
  Filled 2015-06-28: qty 2

## 2015-06-28 MED ORDER — TECHNETIUM TC 99M SESTAMIBI GENERIC - CARDIOLITE
14.0000 | Freq: Once | INTRAVENOUS | Status: AC | PRN
  Administered 2015-06-28: 13.44 via INTRAVENOUS

## 2015-06-28 MED ORDER — SODIUM CHLORIDE 0.9 % IV SOLN
Freq: Once | INTRAVENOUS | Status: AC
  Administered 2015-06-28: 15:00:00 via INTRAVENOUS

## 2015-06-28 NOTE — Progress Notes (Signed)
MD notified. Pts HR is 56 and metoprolol 100 mg scheduled. Orders to hold this a.m. Dose and MD will reassess need. I will continue to assess.

## 2015-06-28 NOTE — Significant Event (Signed)
Rapid Response Event Note  Overview:pt about to be discharged home, found to be cool and clammy all of the sudden, possible fainting spell       Initial Focused Assessment:pt moved back to bed, lethargic, alert and oriented, BP 108/67 and 123/63, SB 51 on monitor and 100% 02 sats on room air.   Interventions: Dr. Earleen Newport in room to assess pt, cancelled pt discharge, and pt will stay in room. Plan discussed with Malachy Mood supervisor, Dr Earleen Newport and Tammy ( pts nurse)   Event Summary:   at      at          [facility]

## 2015-06-28 NOTE — Progress Notes (Signed)
Pt has discharge order. When entered the room patient was not responding well. BP manual was 110/62. Pt was able to state name but became incoherant and slow to respond. BP by dinamap was 70s/40s, HR 54  and patient developed a cold sweat. CBG was >100. Rapid response called, MD notified. Order for 1Liter bolus. Patient will be monitored overnight. I will continue to assess. Patient will keep sitter for safety. Hx of dementia

## 2015-06-28 NOTE — Progress Notes (Signed)
Pt. In bed resting, pt. Prefers male nursing staff, he is more responsive to them. Male CNA sitting with pt. Will continue to monitor pt.

## 2015-06-28 NOTE — Progress Notes (Signed)
Patient ID: Alex [name], male   DOB: 06/04/1940, 75 y.o.   MRN: [id number] [facility] PROGRESS NOTE  Gotham Lenning [id number] DOB: [birth year] DOA: 06/27/2015 PCP: No primary care provider on file.  HPI/Subjective: Patient was seen earlier after negative stress test and again after a rapid response was called. Patient felt a little dizzy while he was getting dressed to go home. He got sweaty and hypotensive. No complaints of chest pain or shortness of breath. He was agreeable to be watched overnight again today.  Objective: Filed Vitals:   06/28/15 1634 06/28/15 1635  BP: 126/78 116/76  Pulse: 60 69  Temp:    Resp:      Filed Weights   06/27/15 0211 06/27/15 1904  Weight: 92.987 kg (205 lb) 96.163 kg (212 lb)    ROS: Review of Systems  Constitutional: Positive for diaphoresis. Negative for fever and chills.  Eyes: Negative for blurred vision.  Respiratory: Negative for cough and shortness of breath.   Cardiovascular: Negative for chest pain.  Gastrointestinal: Negative for nausea, vomiting, abdominal pain, diarrhea and constipation.  Genitourinary: Negative for dysuria.  Musculoskeletal: Negative for joint pain.  Neurological: Negative for dizziness and headaches.   Exam: Physical Exam  HENT:  Nose: No mucosal edema.  Mouth/Throat: No oropharyngeal exudate or posterior oropharyngeal edema.  Eyes: Conjunctivae, EOM and lids are normal. Pupils are equal, round, and reactive to light.  Neck: No JVD present. Carotid bruit is not present. No edema present. No thyroid mass and no thyromegaly present.  Cardiovascular: S1 normal and S2 normal.  Exam reveals no gallop.   No murmur heard. Pulses:      Dorsalis pedis pulses are 2+ on the right side, and 2+ on the left side.  Respiratory: No respiratory distress. He has no wheezes. He has no rhonchi. He has no rales.  GI: Soft. Bowel sounds are normal. There is no tenderness.  Musculoskeletal:       Right  ankle: He exhibits no swelling.       Left ankle: He exhibits no swelling.  Lymphadenopathy:    He has no cervical adenopathy.  Neurological: He is alert. No cranial nerve deficit.  Skin: Skin is warm. No rash noted. Nails show no clubbing.  Psychiatric: His mood appears anxious. He is agitated.      Data Reviewed: Basic Metabolic Panel:  Recent Labs Lab 06/27/15 0240  NA 138  K 3.5  CL 104  CO2 29  GLUCOSE 99  BUN 17  CREATININE 1.21  CALCIUM 8.9  CBC:  Recent Labs Lab 06/27/15 0240  WBC 6.1  HGB 13.2  HCT 39.3*  MCV 90.1  PLT 143*   Cardiac Enzymes:  Recent Labs Lab 06/27/15 0240 06/27/15 1527 06/27/15 1908 06/27/15 2249  TROPONINI <0.03 <0.03 <0.03 <0.03   CBG:  Recent Labs Lab 06/28/15 1406  GLUCAP 108*    Studies: Dg Chest 2 View  06/27/2015  CLINICAL DATA:  Acute onset of retrosternal chest pain, radiating to the left side of the chest and left arm. Initial encounter. EXAM: CHEST  2 VIEW COMPARISON:  None. FINDINGS: The lungs are well-aerated. There is no evidence of pleural effusion or pneumothorax. Mild focal pleural thickening is noted at the left midlung, of uncertain significance. The heart is normal in size; the mediastinal contour is within normal limits. No acute osseous abnormalities are seen. IMPRESSION: Mild focal pleural thickening at the left midlung, of uncertain significance. Would correlate for prior asbestos exposure, and consider CT of  the chest on an elective nonemergent basis, as deemed clinically appropriate. Electronically Signed   By: Garald Balding M.D.   On: 06/27/2015 03:00   Nm Myocar Multi W/spect W/wall Motion / Ef  06/28/2015   There was no ST segment deviation noted during stress.  The study is normal.  This is a low risk study.  The left ventricular ejection fraction is mildly decreased (45-54%).     Scheduled Meds: . aspirin EC  81 mg Oral Daily  . donepezil  10 mg Oral QHS  . enoxaparin (LOVENOX) injection  40  mg Subcutaneous Q24H  . pravastatin  80 mg Oral Daily  . tamsulosin  0.4 mg Oral Daily   Continuous Infusions: . sodium chloride 75 mL/hr at 06/28/15 1521    Assessment/Plan:  1. Likely vasovagal near syncope. I gave a fluid bolus and will continue fluids overnight and hold antihypertensive medications tomorrow we'll get orthostatic vital signs 2. Chest pain. Resolved negative stress test 3. Hyperlipidemia unspecified continue pravastatin 4. BPH continue Flomax 5. Dementia continue Aricept 6. Hip pain chronic  Code Status:     Code Status Orders        Start     Ordered   06/27/15 1938  Full code   Continuous     06/27/15 1938    Code Status History    Date Active Date Inactive Code Status Order ID Comments User Context   This patient has a current code status but no historical code status.     Family Communication: Spoke with ex-wife at the bedside and daughter on the phone Disposition Plan: Likely home tomorrow  Time spent: 35 minutes  Loletha Grayer  [facility] [place] [facility]

## 2015-06-29 LAB — BASIC METABOLIC PANEL
Anion gap: 8 (ref 5–15)
BUN: 16 mg/dL (ref 6–20)
CO2: 23 mmol/L (ref 22–32)
Calcium: 8.5 mg/dL — ABNORMAL LOW (ref 8.9–10.3)
Chloride: 107 mmol/L (ref 101–111)
Creatinine, Ser: 1.09 mg/dL (ref 0.61–1.24)
GFR calc Af Amer: >60 mL/min (ref >60)
GFR calc non Af Amer: >60 mL/min (ref >60)
Glucose, Bld: 101 mg/dL — ABNORMAL HIGH (ref 65–99)
Potassium: 3.2 mmol/L — ABNORMAL LOW (ref 3.5–5.1)
Sodium: 138 mmol/L (ref 135–145)

## 2015-06-29 LAB — CK: Total CK: 234 U/L (ref 49–397)

## 2015-06-29 MED ORDER — MAGNESIUM SULFATE 2 GM/50ML IV SOLN
2.0000 g | Freq: Once | INTRAVENOUS | Status: AC
  Administered 2015-06-29: 2 g via INTRAVENOUS
  Filled 2015-06-29: qty 50

## 2015-06-29 MED ORDER — FINASTERIDE 5 MG PO TABS: 5.0000 mg | ORAL_TABLET | Freq: Every day | ORAL | Status: DC

## 2015-06-29 MED ORDER — DONEPEZIL HCL 10 MG PO TABS: 10.0000 mg | ORAL_TABLET | Freq: Every day | ORAL | Status: DC

## 2015-06-29 MED ORDER — POTASSIUM CHLORIDE CRYS ER 20 MEQ PO TBCR
40.0000 meq | EXTENDED_RELEASE_TABLET | Freq: Once | ORAL | Status: AC
  Administered 2015-06-29: 40 meq via ORAL
  Filled 2015-06-29: qty 2

## 2015-06-29 NOTE — Discharge Summary (Signed)
[place] [facility] [place] NAME: Alex [name]    MR#:  [id number]  DATE OF BIRTH:  [birth year]  DATE OF ADMISSION:  06/27/2015 ADMITTING PHYSICIAN: Saundra Shelling, MD  DATE OF DISCHARGE: 06/29/2015 10:55 AM  PRIMARY CARE PHYSICIAN: No primary care provider on file.    ADMISSION DIAGNOSIS:  Chest pain [R07.9] Chest pain, unspecified chest pain type [R07.9]  DISCHARGE DIAGNOSIS:  Principal Problem:   Chest pain Active Problems:   Uncontrolled hypertension   Bradycardia   Pleural thickening   SECONDARY DIAGNOSIS:   Past Medical History  Diagnosis Date  . Hypertension   . CAD (coronary artery disease)   . Dementia   . BPH (benign prostatic hyperplasia)     HOSPITAL COURSE:   1. Atypical chest pain. Patient had a negative stress test and no further chest pain 2. Near syncopal event after the negative stress test where he became sweaty and hypotensive. We gave a fluid bolus and held all of his antihypertensive medications. In the a.m. he was still a little orthostatic but felt okay walking around and wanted to go home. I sent him home with close monitoring with his ex-wife. I held all of his antihypertensive medications and his Flomax. I advised hydration. 3. BPH. Switch to Proscar 4. Dementia continue Aricept 5. Chronic hip pain. 6. Hyperlipidemia continue pravastatin 7. History of CAD with negative stress test continue aspirin 8. Hypokalemia this was replaced. I also gave him IV magnesium prior to discharge home.  DISCHARGE CONDITIONS:   Satisfactory  CONSULTS OBTAINED:  None  DRUG ALLERGIES:   Allergies  Allergen Reactions  . Penicillins Other (See Comments)    Unknown reaction, childhood     DISCHARGE MEDICATIONS:   Discharge Medication List as of 06/29/2015 10:28 AM    START taking these medications   Details  finasteride (PROSCAR) 5 MG tablet Take 1 tablet (5 mg total) by mouth daily., Starting 06/29/2015,  Until Discontinued, Print      CONTINUE these medications which have CHANGED   Details  donepezil (ARICEPT) 10 MG tablet Take 1 tablet (10 mg total) by mouth at bedtime., Starting 06/29/2015, Until Discontinued, Print      CONTINUE these medications which have NOT CHANGED   Details  aspirin EC 81 MG tablet Take 81 mg by mouth daily., Until Discontinued, Historical Med    pravastatin (PRAVACHOL) 80 MG tablet Take 80 mg by mouth daily., Until Discontinued, Historical Med    ibuprofen (ADVIL,MOTRIN) 800 MG tablet Take 800 mg by mouth every 8 (eight) hours as needed., Until Discontinued, Historical Med      STOP taking these medications     amLODipine (NORVASC) 10 MG tablet      furosemide (LASIX) 40 MG tablet      lisinopril (PRINIVIL,ZESTRIL) 40 MG tablet      metoprolol succinate (TOPROL-XL) 100 MG 24 hr tablet      potassium chloride SA (K-DUR,KLOR-CON) 20 MEQ tablet      tamsulosin (FLOMAX) 0.4 MG CAPS capsule          DISCHARGE INSTRUCTIONS:   Follow-up medical doctor in one week. Ex-wife states that she wants to take to Dr. Rosario Jacks  If you experience worsening of your admission symptoms, develop shortness of breath, life threatening emergency, suicidal or homicidal thoughts you must seek medical attention immediately by calling 911 or calling your MD immediately  if symptoms less severe.  You Must read complete instructions/literature along with all the possible  adverse reactions/side effects for all the Medicines you take and that have been prescribed to you. Take any new Medicines after you have completely understood and accept all the possible adverse reactions/side effects.   Please note  You were cared for by a hospitalist during your hospital stay. If you have any questions about your discharge medications or the care you received while you were in the hospital after you are discharged, you can call the unit and asked to speak with the hospitalist on call if the  hospitalist that took care of you is not available. Once you are discharged, your primary care physician will handle any further medical issues. Please note that NO REFILLS for any discharge medications will be authorized once you are discharged, as it is imperative that you return to your primary care physician (or establish a relationship with a primary care physician if you do not have one) for your aftercare needs so that they can reassess your need for medications and monitor your lab values.    Today   CHIEF COMPLAINT:   Chief Complaint  Patient presents with  . Chest Pain    HISTORY OF PRESENT ILLNESS:  Alex [name]  is a 75 y.o. male presented with chest pain   VITAL SIGNS:  Blood pressure 98/56, pulse 59, temperature 98.6 F (37 C), temperature source Oral, resp. rate 16, height 6\' 2"  (1.88 m), weight 96.163 kg (212 lb), SpO2 98 %.   PHYSICAL EXAMINATION:  GENERAL:  75 y.o.-year-old patient lying in the bed with no acute distress.  EYES: Pupils equal, round, reactive to light and accommodation. No scleral icterus. Extraocular muscles intact.  HEENT: Head atraumatic, normocephalic. Oropharynx and nasopharynx clear.  NECK:  Supple, no jugular venous distention. No thyroid enlargement, no tenderness.  LUNGS: Normal breath sounds bilaterally, no wheezing, rales,rhonchi or crepitation. No use of accessory muscles of respiration.  CARDIOVASCULAR: S1, S2 bradycardic. No murmurs, rubs, or gallops.  ABDOMEN: Soft, non-tender, non-distended. Bowel sounds present. No organomegaly or mass.  EXTREMITIES: No pedal edema, cyanosis, or clubbing.  NEUROLOGIC: Cranial nerves II through XII are intact. Muscle strength 5/5 in all extremities. Sensation intact. Gait not checked.  PSYCHIATRIC: The patient is alert.  SKIN: No obvious rash, lesion, or ulcer.   DATA REVIEW:   CBC  Recent Labs Lab 06/27/15 0240  WBC 6.1  HGB 13.2  HCT 39.3*  PLT 143*    Chemistries   Recent  Labs Lab 06/29/15 0503  NA 138  K 3.2*  CL 107  CO2 23  GLUCOSE 101*  BUN 16  CREATININE 1.09  CALCIUM 8.5*    Cardiac Enzymes  Recent Labs Lab 06/27/15 2249  TROPONINI <0.03    RADIOLOGY:  Nm Myocar Multi W/spect W/wall Motion / Ef  06/28/2015   There was no ST segment deviation noted during stress.  The study is normal.  This is a low risk study.  The left ventricular ejection fraction is mildly decreased (45-54%).     Management plans discussed with the patient, family and they are in agreement.  CODE STATUS:  Code Status History    Date Active Date Inactive Code Status Order ID Comments User Context   06/27/2015  7:38 PM 06/29/2015  2:14 PM Full Code [id number]  Sylvan Cheese, MD Inpatient      TOTAL TIME TAKING CARE OF THIS PATIENT: 35 minutes.    Loletha Grayer M.D on 06/29/2015 at 3:34 PM  Between 7am to 6pm - Pager - [phone]  After  6pm go to [email or web address] - password [email or web address] [facility]  [place] [facility]  Office  [phone]  CC: Primary care physician; No primary care provider on file.

## 2015-06-29 NOTE — Progress Notes (Signed)
Alert to self. Room air. Ambulated around the nurses station. Takes meds ok. IV and tele removed. Discharge instructions given to pt. Pt has no further concerns at this time.

## 2015-06-29 NOTE — Discharge Instructions (Signed)

## 2016-08-15 ENCOUNTER — Observation Stay
Admission: EM | Admit: 2016-08-15 | Discharge: 2016-08-15 | Discharge status: Against Medical Advice | Payer: Medicare Other | Attending: Internal Medicine | Admitting: Internal Medicine

## 2016-08-15 ENCOUNTER — Encounter: Payer: Self-pay | Admitting: Radiology

## 2016-08-15 ENCOUNTER — Emergency Department: Payer: Medicare Other

## 2016-08-15 ENCOUNTER — Encounter: Payer: Self-pay | Admitting: Emergency Medicine

## 2016-08-15 ENCOUNTER — Emergency Department
Admission: EM | Admit: 2016-08-15 | Discharge: 2016-08-15 | Disposition: A | Discharge status: Home / Self Care | Payer: Medicare Other | Source: Home / Self Care | Attending: Emergency Medicine | Admitting: Emergency Medicine

## 2016-08-15 DIAGNOSIS — K7689 Other specified diseases of liver: Secondary | ICD-10-CM | POA: Diagnosis not present

## 2016-08-15 DIAGNOSIS — R079 Chest pain, unspecified: Secondary | ICD-10-CM | POA: Diagnosis present

## 2016-08-15 DIAGNOSIS — I712 Thoracic aortic aneurysm, without rupture: Secondary | ICD-10-CM | POA: Insufficient documentation

## 2016-08-15 DIAGNOSIS — Z880 Allergy status to penicillin: Secondary | ICD-10-CM | POA: Diagnosis not present

## 2016-08-15 DIAGNOSIS — Z79899 Other long term (current) drug therapy: Secondary | ICD-10-CM

## 2016-08-15 DIAGNOSIS — R001 Bradycardia, unspecified: Secondary | ICD-10-CM | POA: Insufficient documentation

## 2016-08-15 DIAGNOSIS — G309 Alzheimer's disease, unspecified: Secondary | ICD-10-CM

## 2016-08-15 DIAGNOSIS — Z7982 Long term (current) use of aspirin: Secondary | ICD-10-CM

## 2016-08-15 DIAGNOSIS — R0789 Other chest pain: Secondary | ICD-10-CM

## 2016-08-15 DIAGNOSIS — I2510 Atherosclerotic heart disease of native coronary artery without angina pectoris: Secondary | ICD-10-CM

## 2016-08-15 DIAGNOSIS — F0280 Dementia in other diseases classified elsewhere without behavioral disturbance: Secondary | ICD-10-CM | POA: Insufficient documentation

## 2016-08-15 DIAGNOSIS — N400 Enlarged prostate without lower urinary tract symptoms: Secondary | ICD-10-CM | POA: Diagnosis not present

## 2016-08-15 DIAGNOSIS — R031 Nonspecific low blood-pressure reading: Secondary | ICD-10-CM

## 2016-08-15 DIAGNOSIS — Z955 Presence of coronary angioplasty implant and graft: Secondary | ICD-10-CM | POA: Diagnosis not present

## 2016-08-15 DIAGNOSIS — I10 Essential (primary) hypertension: Secondary | ICD-10-CM | POA: Diagnosis not present

## 2016-08-15 HISTORY — DX: Alzheimer's disease, unspecified: G30.9

## 2016-08-15 HISTORY — DX: Dementia in other diseases classified elsewhere, unspecified severity, without behavioral disturbance, psychotic disturbance, mood disturbance, and anxiety: F02.80

## 2016-08-15 LAB — BASIC METABOLIC PANEL
Anion gap: 5 (ref 5–15)
BUN: 16 mg/dL (ref 6–20)
CO2: 29 mmol/L (ref 22–32)
Calcium: 9 mg/dL (ref 8.9–10.3)
Chloride: 105 mmol/L (ref 101–111)
Creatinine, Ser: 1.27 mg/dL — ABNORMAL HIGH (ref 0.61–1.24)
GFR calc Af Amer: >60 mL/min (ref >60)
GFR calc non Af Amer: 53 mL/min — ABNORMAL LOW (ref >60)
Glucose, Bld: 96 mg/dL (ref 65–99)
Potassium: 3.6 mmol/L (ref 3.5–5.1)
Sodium: 139 mmol/L (ref 135–145)

## 2016-08-15 LAB — CBC
HCT: 39.7 % — ABNORMAL LOW (ref 40.0–52.0)
Hemoglobin: 13.4 g/dL (ref 13.0–18.0)
MCH: 30.2 pg (ref 26.0–34.0)
MCHC: 33.8 g/dL (ref 32.0–36.0)
MCV: 89.2 fL (ref 80.0–100.0)
Platelets: 124 10*3/uL — ABNORMAL LOW (ref 150–440)
RBC: 4.45 MIL/uL (ref 4.40–5.90)
RDW: 13.8 % (ref 11.5–14.5)
WBC: 5.6 10*3/uL (ref 3.8–10.6)

## 2016-08-15 LAB — URINALYSIS, COMPLETE (UACMP) WITH MICROSCOPIC
Bacteria, UA: NONE SEEN
Bilirubin Urine: NEGATIVE
Glucose, UA: NEGATIVE mg/dL
Ketones, ur: NEGATIVE mg/dL
Leukocytes, UA: NEGATIVE
Nitrite: NEGATIVE
Protein, ur: 30 mg/dL — AB
Specific Gravity, Urine: 1.039 — ABNORMAL HIGH (ref 1.005–1.030)
Squamous Epithelial / LPF: NONE SEEN
pH: 5 (ref 5.0–8.0)

## 2016-08-15 LAB — TROPONIN I
Troponin I: <0.03 ng/mL (ref <0.03)
Troponin I: <0.03 ng/mL (ref <0.03)
Troponin I: <0.03 ng/mL (ref <0.03)

## 2016-08-15 IMAGING — CT CT ANGIO CHEST
2 of 6 series · 18 of 46 positions shown · IV contrast (APPLIED)
Comparison: None.

CLINICAL DATA: Hypotension and bradycardia.

EXAM:
CT ANGIOGRAPHY CHEST WITH CONTRAST
TECHNIQUE: Multidetector CT imaging of the chest was performed using the
standard protocol during bolus administration of intravenous
contrast. Multiplanar CT image reconstructions and MIPs were
obtained to evaluate the vascular anatomy.
CONTRAST:  75 mL of Isovue 370

[Series 4: axial arterial · axial · arterial · 0.79mm/px · z∈[-746,-491]mm · 15 of 95 slices shown]
[im 5/95  lung]
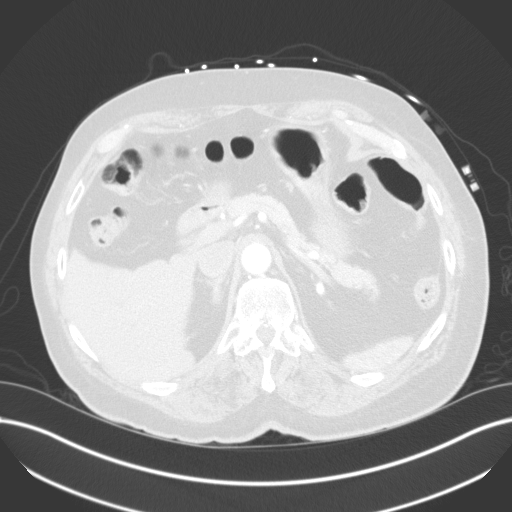
[im 10/95  soft-tissue]
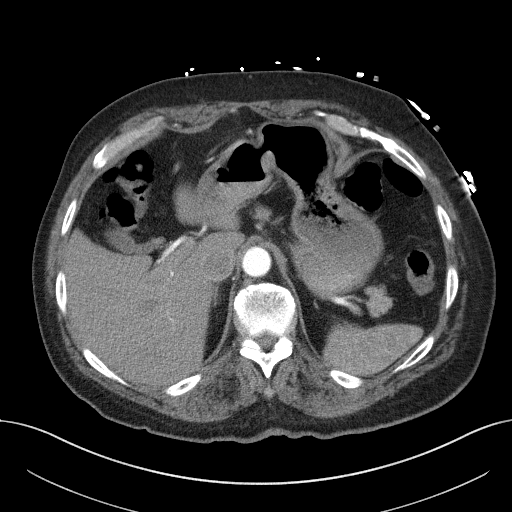
[im 20/95  lung]
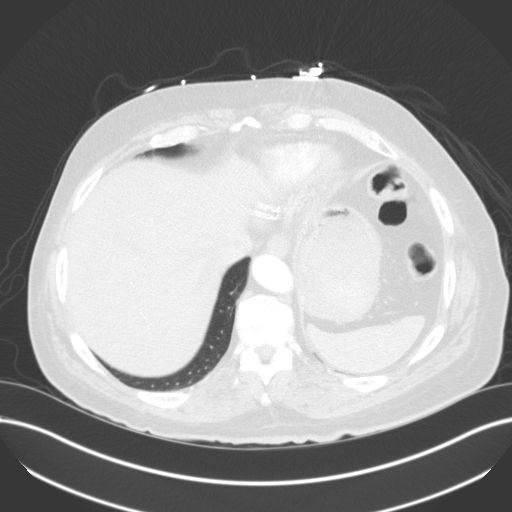
[im 25/95  soft-tissue]
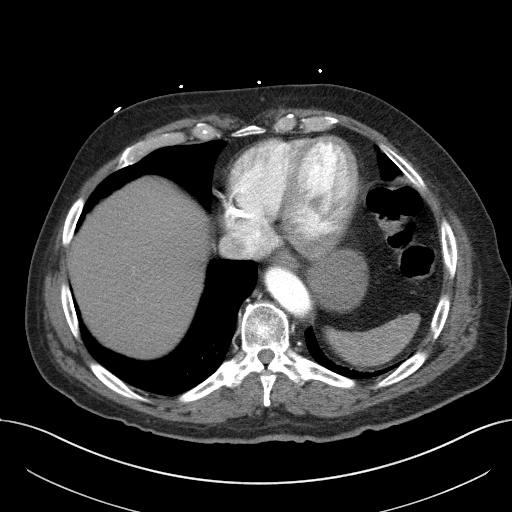
[im 30/95  lung]
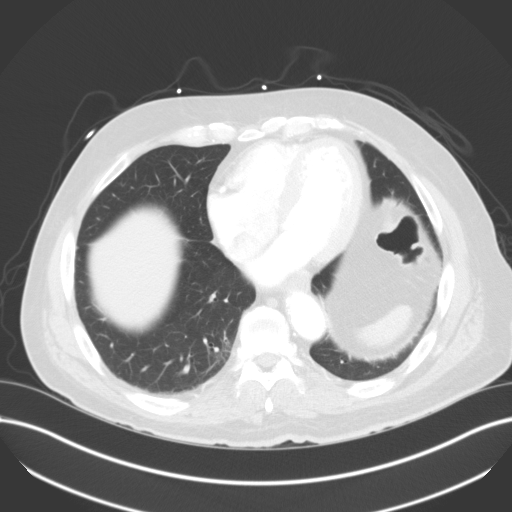
[im 35/95  soft-tissue]
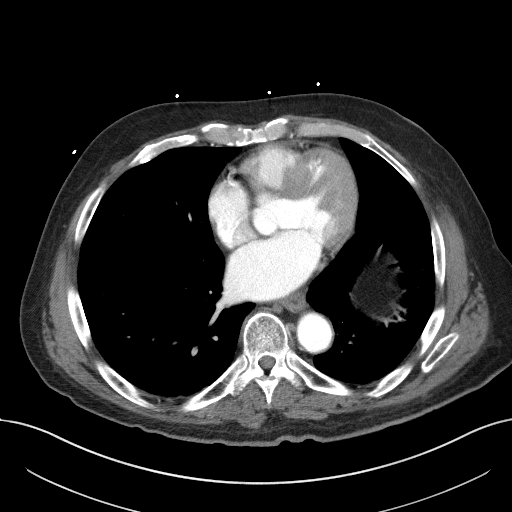
[im 40/95  lung]
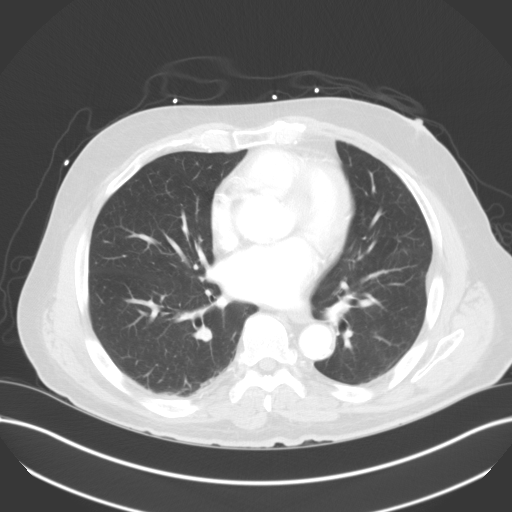
[im 50/95  soft-tissue]
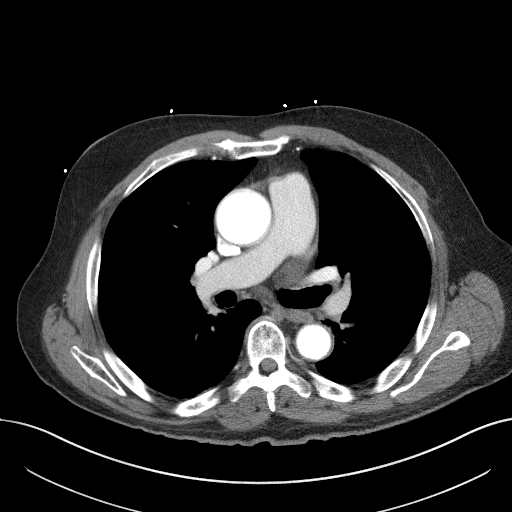
[im 55/95  lung]
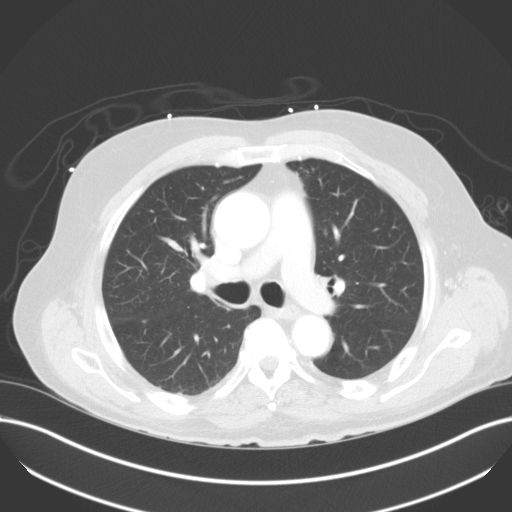
[im 60/95  soft-tissue]
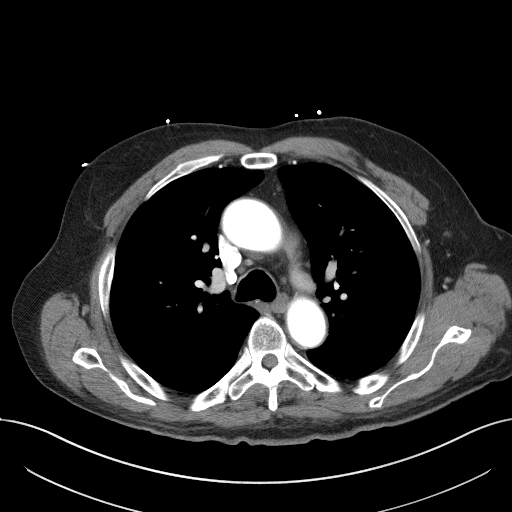
[im 65/95  lung]
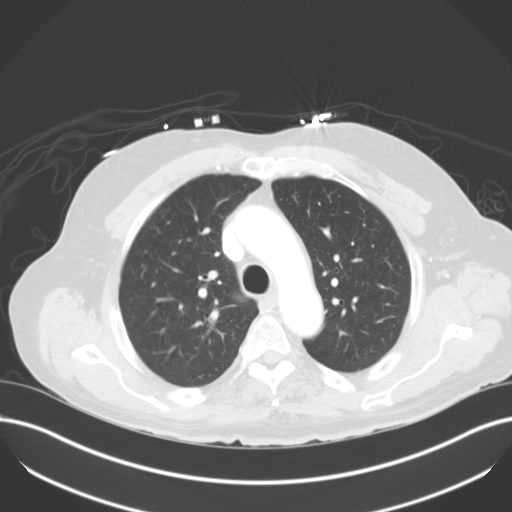
[im 70/95  soft-tissue]
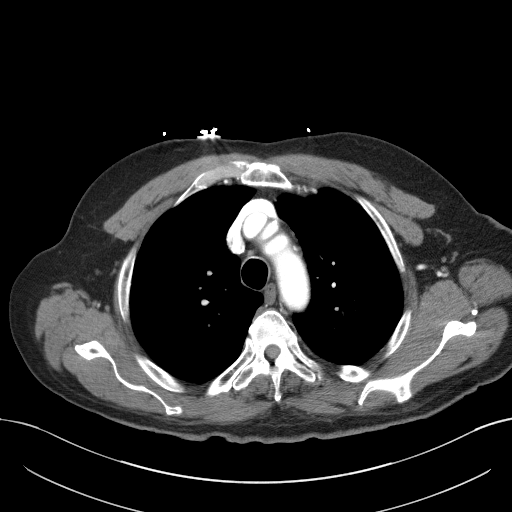
[im 80/95  lung]
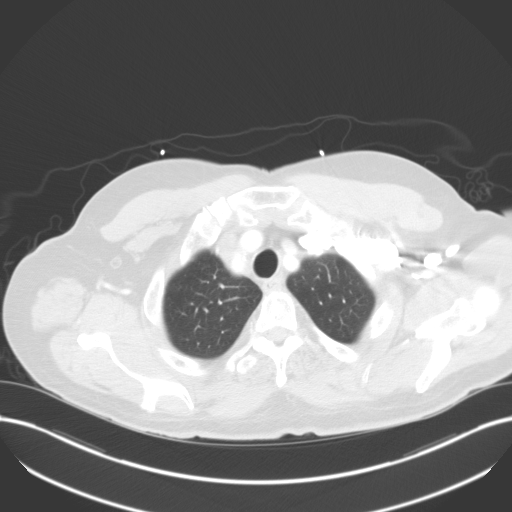
[im 85/95  soft-tissue]
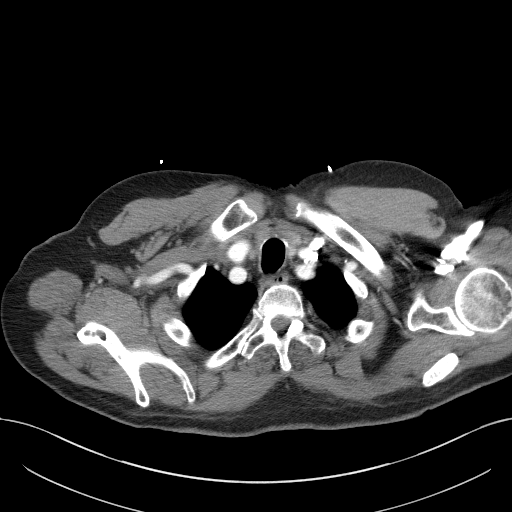
[im 90/95  lung]
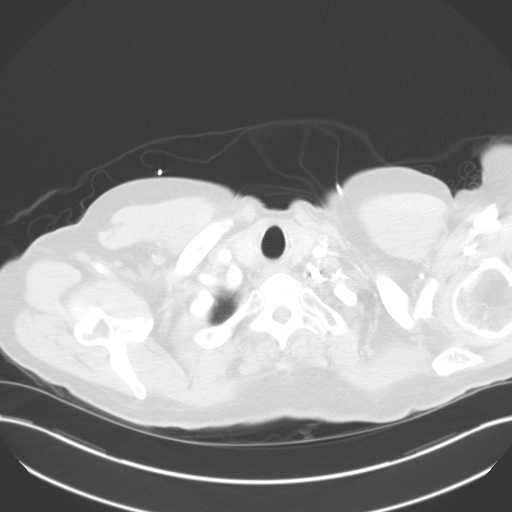

[Series 6: coronal · coronal · 0.59mm/px · 3 of 94 slices shown]
[im 24/94  soft-tissue]
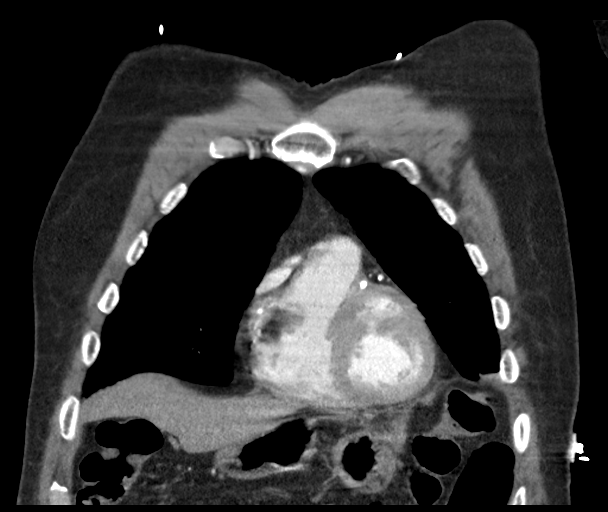
[im 47/94  soft-tissue]
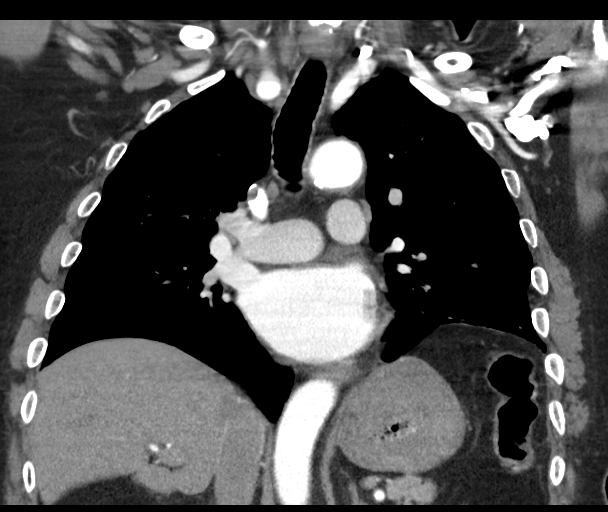
[im 70/94  soft-tissue]
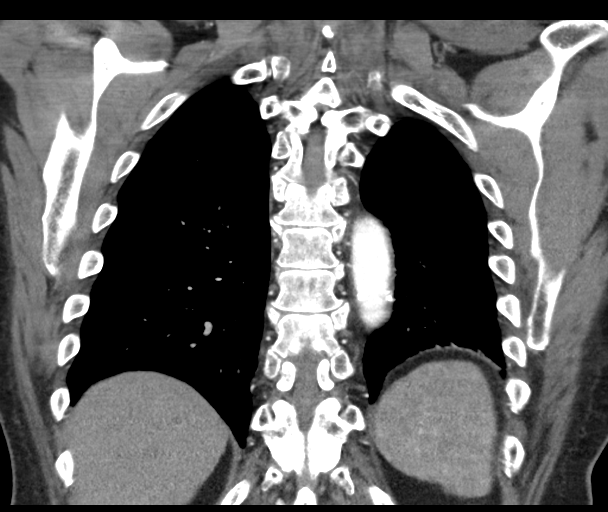

[18 of 46 positions shown; findings below may reference images not displayed]

FINDINGS: Cardiovascular: Coronary artery calcifications identified. The heart
size is normal. Central pulmonary arteries are normal in caliber.
The study was not tailored to evaluate pulmonary arteries but no
central emboli identified. The ascending thoracic aorta measures
cm in AP dimension on series 4, image 45. No dissection. Mild
atherosclerosis. Branching vessels are normal.

Mediastinum/Nodes: No enlarged mediastinal, hilar, or axillary lymph
nodes. Thyroid gland, trachea, and esophagus demonstrate no
significant findings.

Lungs/Pleura: The central airways are normal. No pneumothorax. A
calcified nodule along the right major fissure seen on image 26. No
other suspicious nodules or masses. Focal subpleural fat laterally
on the left on image 54 of no acute significance. Atelectasis and/
or scarring in the left base.

Upper Abdomen: No acute abnormality. Multiple small cysts in the
liver.

Musculoskeletal: No chest wall abnormality. No acute or significant
osseous findings.

Review of the MIP images confirms the above findings.
IMPRESSION: 1. Thoracic aortic aneurysm measuring up to 4.4 cm with no
dissection. Recommend annual imaging followup by CTA or MRA. This
recommendation follows [1L]
ACCF/AHA/AATS/ACR/ASA/SCA/CHE WAH/CHE WAH/CHE WAH/CHE WAH Guidelines for the
Diagnosis and Management of Patients with Thoracic Aortic Disease.
Circulation. [1L]; 121: [id number]
2. No acute abnormalities.

## 2016-08-15 IMAGING — CR DG CHEST 2V
2 series · 2 of 2 positions shown · non-contrast
Comparison: [DATE]

CLINICAL DATA: Chest pain

EXAM:
CHEST  2 VIEW

[chest pa]
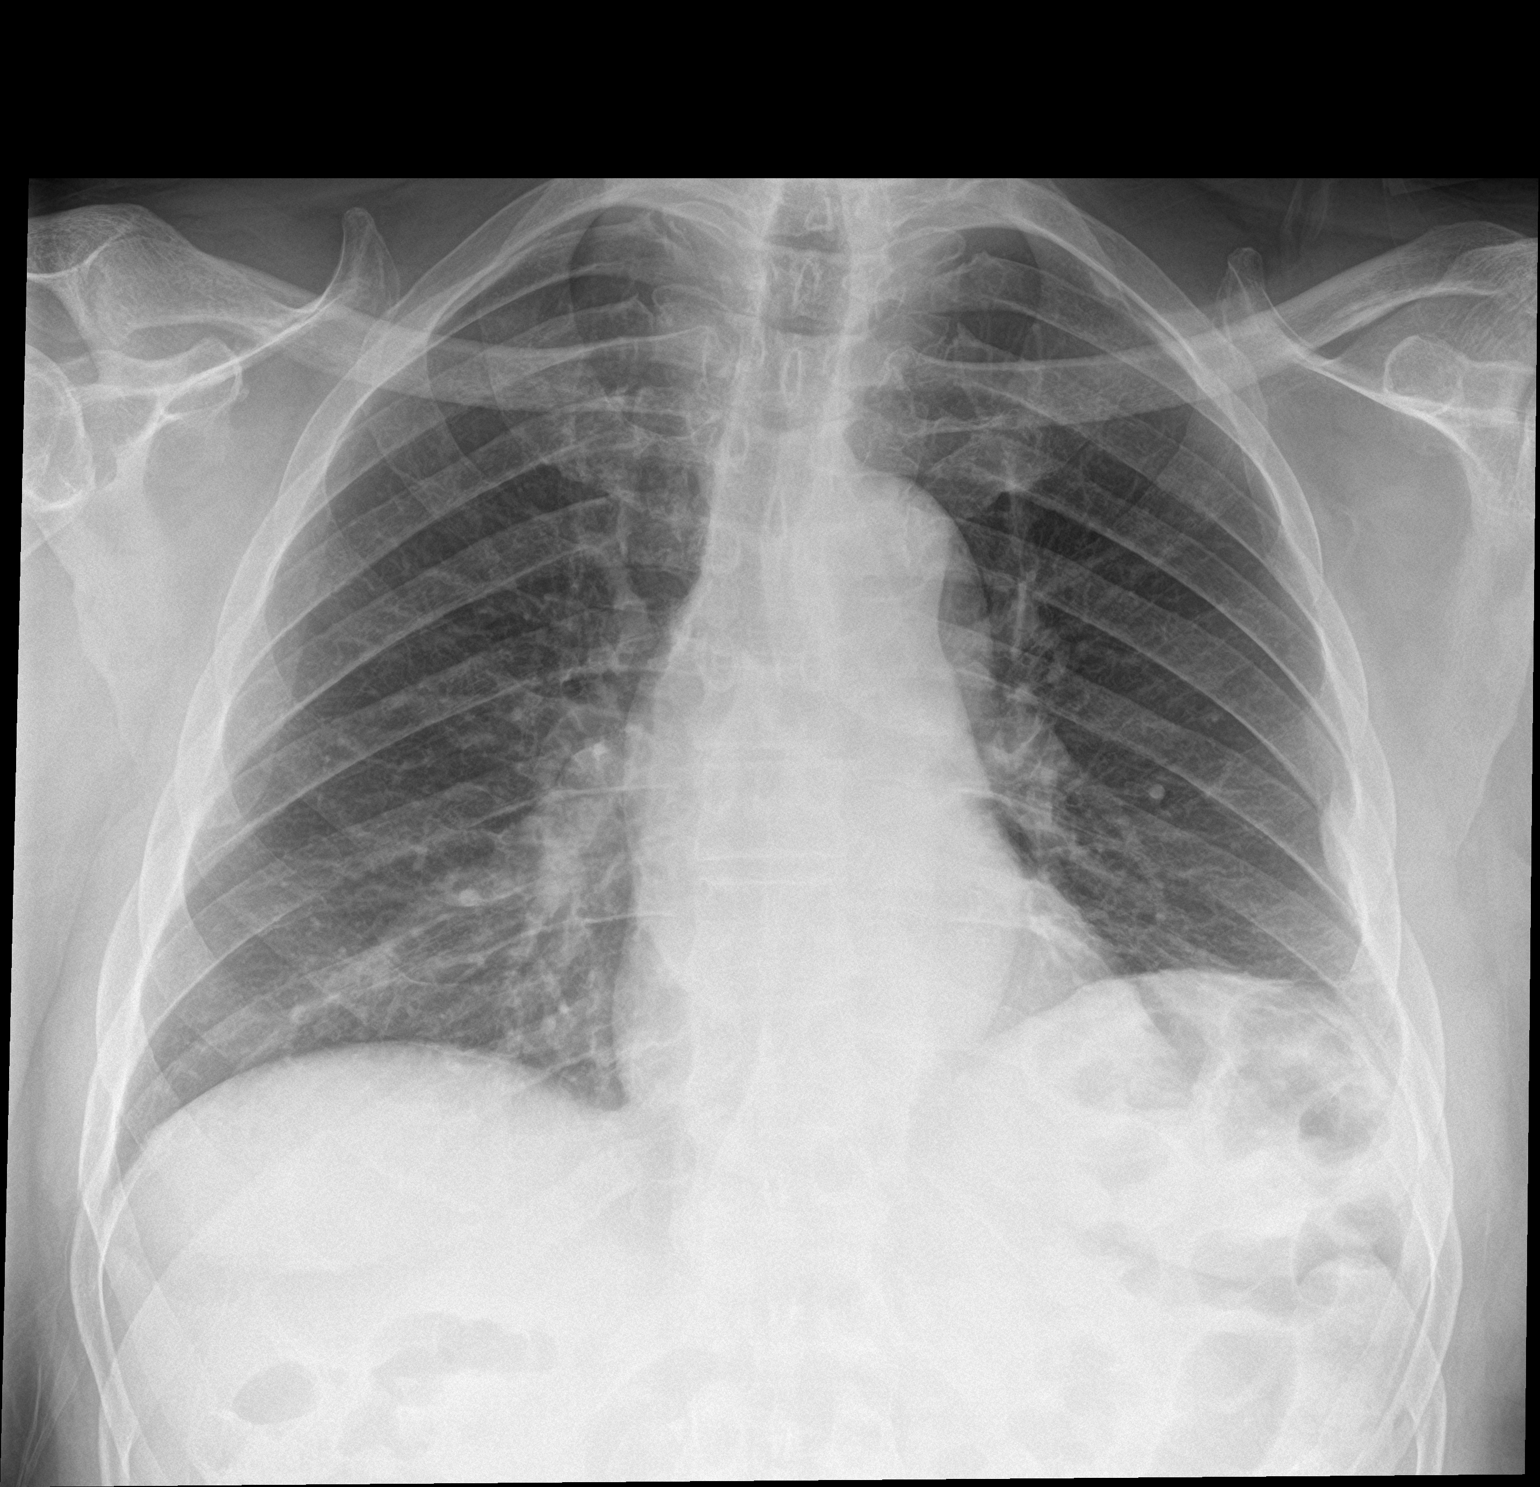

[chest lat]
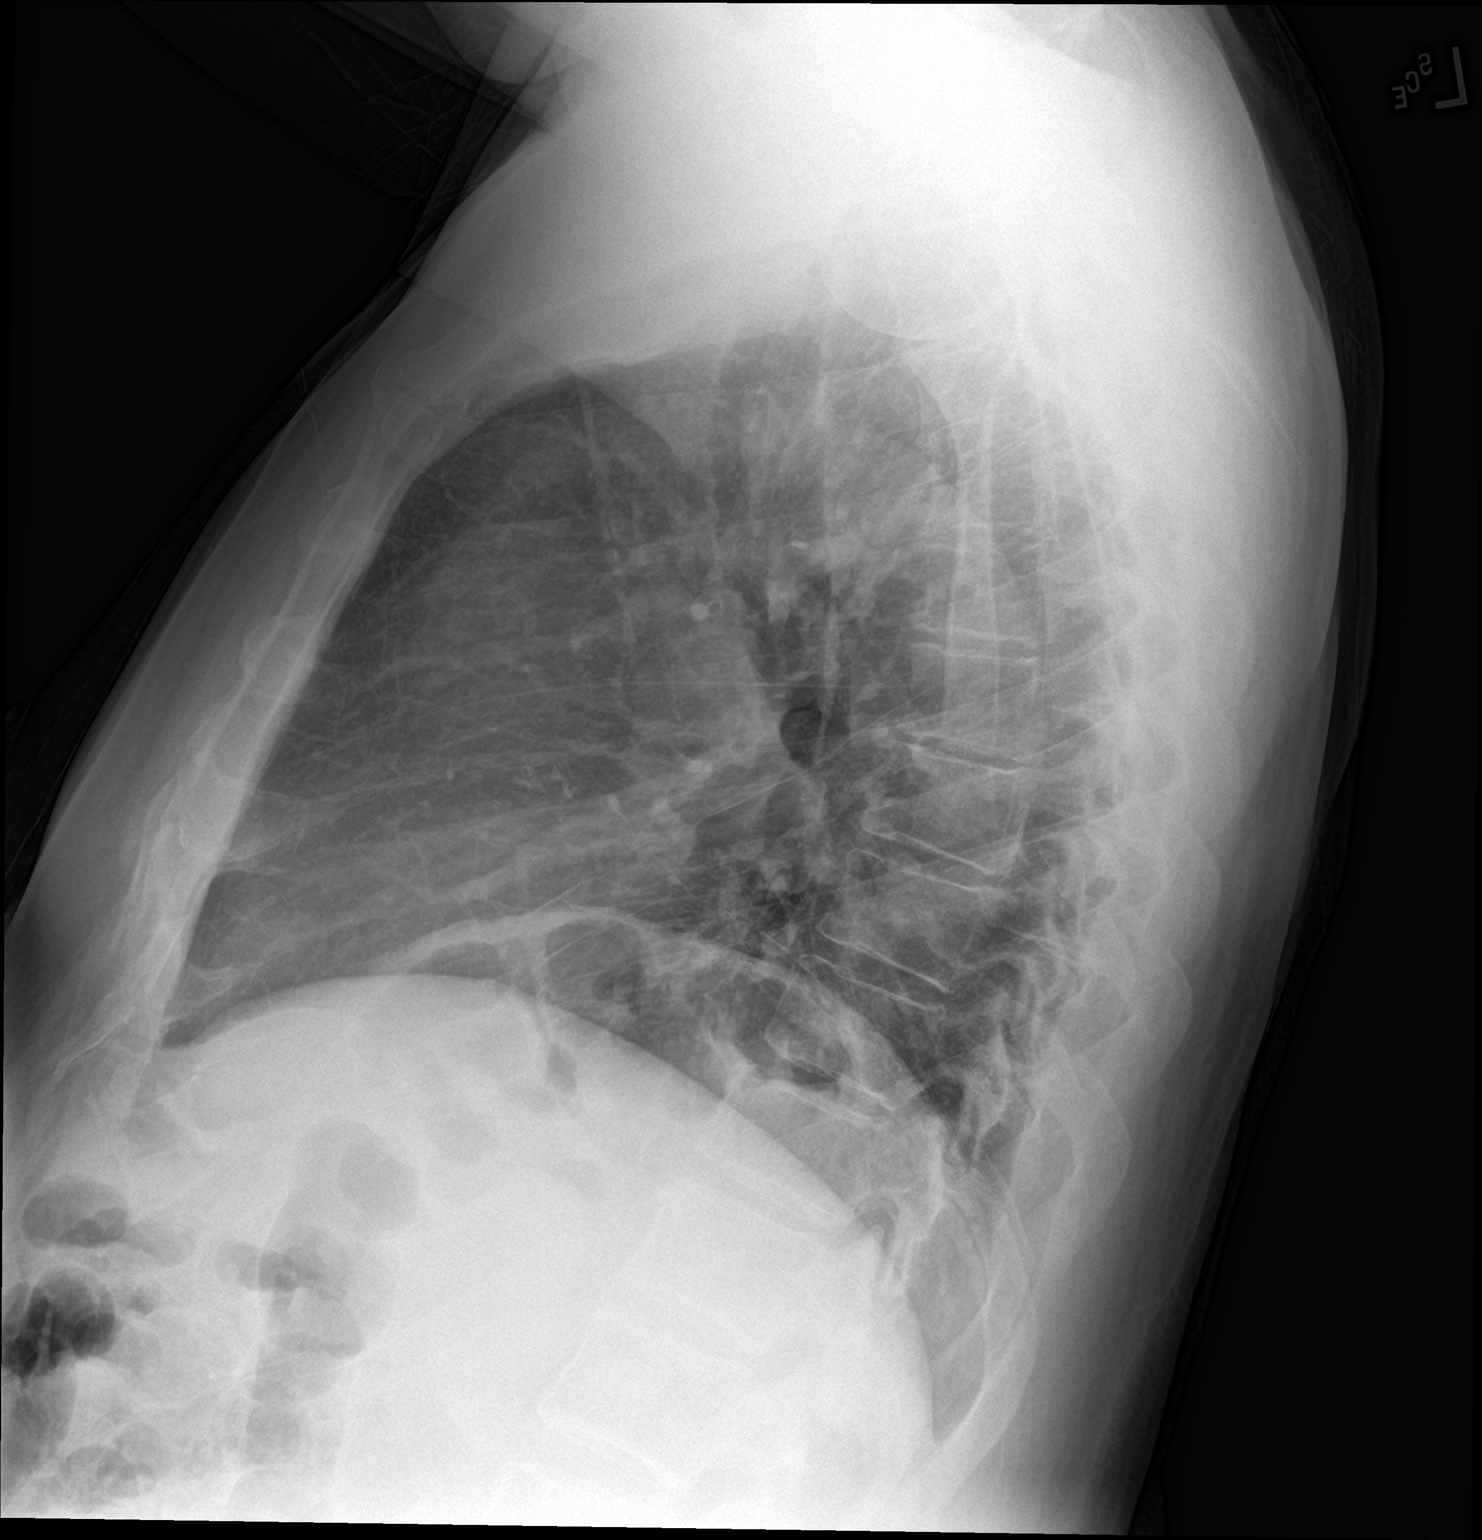

[2 of 2 positions shown; findings below may reference images not displayed]

FINDINGS: Left base atelectasis. Right lung is clear. Heart is normal size. No
effusions or acute bony abnormality. Old healed left rib fractures.
IMPRESSION: Left base atelectasis.

## 2016-08-15 MED ORDER — ASPIRIN EC 81 MG PO TBEC: 81.0000 mg | DELAYED_RELEASE_TABLET | Freq: Every day | ORAL | Status: DC

## 2016-08-15 MED ORDER — AMLODIPINE BESYLATE 5 MG PO TABS
5.0000 mg | ORAL_TABLET | Freq: Every day | ORAL | Status: DC
  Administered 2016-08-15: 5 mg via ORAL
  Filled 2016-08-15: qty 1

## 2016-08-15 MED ORDER — ACETAMINOPHEN 325 MG PO TABS: 650.0000 mg | ORAL_TABLET | Freq: Four times a day (QID) | ORAL | Status: DC | PRN

## 2016-08-15 MED ORDER — FINASTERIDE 5 MG PO TABS
5.0000 mg | ORAL_TABLET | Freq: Every day | ORAL | Status: DC
  Administered 2016-08-15: 5 mg via ORAL
  Filled 2016-08-15: qty 1

## 2016-08-15 MED ORDER — ACETAMINOPHEN 650 MG RE SUPP: 650.0000 mg | Freq: Four times a day (QID) | RECTAL | Status: DC | PRN

## 2016-08-15 MED ORDER — ENOXAPARIN SODIUM 40 MG/0.4ML ~~LOC~~ SOLN: 40.0000 mg | SUBCUTANEOUS | Status: DC

## 2016-08-15 MED ORDER — ASPIRIN 81 MG PO CHEW
324.0000 mg | CHEWABLE_TABLET | Freq: Once | ORAL | Status: AC
  Administered 2016-08-15: 324 mg via ORAL
  Filled 2016-08-15: qty 4

## 2016-08-15 MED ORDER — ONDANSETRON HCL 4 MG/2ML IJ SOLN: 4.0000 mg | Freq: Four times a day (QID) | INTRAMUSCULAR | Status: DC | PRN

## 2016-08-15 MED ORDER — LISINOPRIL 20 MG PO TABS
40.0000 mg | ORAL_TABLET | Freq: Every day | ORAL | Status: DC
  Administered 2016-08-15: 40 mg via ORAL
  Filled 2016-08-15: qty 2

## 2016-08-15 MED ORDER — METOPROLOL SUCCINATE ER 50 MG PO TB24: 50.0000 mg | ORAL_TABLET | Freq: Every day | ORAL | Status: DC

## 2016-08-15 MED ORDER — FUROSEMIDE 40 MG PO TABS
40.0000 mg | ORAL_TABLET | Freq: Every day | ORAL | Status: DC
Start: 2016-08-16 — End: 2016-08-15

## 2016-08-15 MED ORDER — RIVASTIGMINE TARTRATE 3 MG PO CAPS
6.0000 mg | ORAL_CAPSULE | Freq: Two times a day (BID) | ORAL | Status: DC
  Administered 2016-08-15: 6 mg via ORAL
  Filled 2016-08-15 (×3): qty 2

## 2016-08-15 MED ORDER — IOPAMIDOL (ISOVUE-370) INJECTION 76%
100.0000 mL | Freq: Once | INTRAVENOUS | Status: AC | PRN
  Administered 2016-08-15: 75 mL via INTRAVENOUS

## 2016-08-15 MED ORDER — HYDRALAZINE HCL 20 MG/ML IJ SOLN: 10.0000 mg | Freq: Four times a day (QID) | INTRAMUSCULAR | Status: DC | PRN

## 2016-08-15 MED ORDER — ONDANSETRON HCL 4 MG PO TABS: 4.0000 mg | ORAL_TABLET | Freq: Four times a day (QID) | ORAL | Status: DC | PRN

## 2016-08-15 MED ORDER — PRAVASTATIN SODIUM 40 MG PO TABS: 80.0000 mg | ORAL_TABLET | Freq: Every day | ORAL | Status: DC

## 2016-08-15 MED ORDER — METOPROLOL SUCCINATE ER 50 MG PO TB24
50.0000 mg | ORAL_TABLET | Freq: Every day | ORAL | Status: DC
  Filled 2016-08-15: qty 1

## 2016-08-15 NOTE — ED Triage Notes (Signed)
C/O right sided chest pain. Onset of symptoms last night around 2200.

## 2016-08-15 NOTE — ED Provider Notes (Signed)
[facility] Emergency Department Provider Note  ____________________________________________  Time seen: Approximately 6:18 PM  I have reviewed the triage vital signs and the nursing notes.   HISTORY  Chief Complaint Hypotension  Level 5 caveat:  Portions of the history and physical were unable to be obtained due to the patient's altered mental status from chronic dementia.   HPI Alex [name] is a 76 y.o. male brought to the ED by EMS due to an episode of hypotension at home. Patient was seen in the emergency department at 8:00 this morning where he was having vague chest pain. Because of his dementia and limited ability to give a history, he was plan for hospital observation for serial troponins and cardiology evaluation. However, earlier this afternoon he left the hospital [place]. On returning home, he was sitting outside in a chair on the porch and he was feeling weak and unable to get out of the chair. His family checked his blood pressure and found it to be low, about 70/50. They also report that his heart rate was low. It's normally about 50. EMS report that his blood pressure was low for them as well,  about 80/60.  Patient denies any complaints whatsoever.      Past Medical History:  Diagnosis Date  . Alzheimer's dementia   . BPH (benign prostatic hyperplasia)   . CAD (coronary artery disease)   . Dementia   . Hypertension      Patient Active Problem List   Diagnosis Date Noted  . Chest pain 06/27/2015  . Uncontrolled hypertension 06/27/2015  . Bradycardia 06/27/2015  . Pleural thickening 06/27/2015     Past Surgical History:  Procedure Laterality Date  . CORONARY ANGIOPLASTY WITH STENT PLACEMENT    . HERNIA REPAIR       Prior to Admission medications   Medication Sig Start Date End Date Taking? Authorizing Provider  amLODipine (NORVASC) 5 MG tablet Take 5 mg by mouth daily.   Yes [provider]  aspirin  EC 81 MG tablet Take 81 mg by mouth daily.   Yes [provider]  finasteride (PROSCAR) 5 MG tablet Take 1 tablet (5 mg total) by mouth daily. 06/29/15  Yes Wieting, Richard, MD  furosemide (LASIX) 40 MG tablet Take 40 mg by mouth daily. 08/12/16  Yes [provider]  ibuprofen (ADVIL,MOTRIN) 200 MG tablet Take 200 mg by mouth 2 (two) times daily.   Yes [provider]  lisinopril (PRINIVIL,ZESTRIL) 40 MG tablet Take 40 mg by mouth daily.   Yes [provider]  metoprolol succinate (TOPROL-XL) 50 MG 24 hr tablet Take 50 mg by mouth daily. Take with or immediately following a meal.   Yes [provider]  pravastatin (PRAVACHOL) 80 MG tablet Take 80 mg by mouth daily.   Yes [provider]  rivastigmine (EXELON) 6 MG capsule Take 6 mg by mouth 2 (two) times daily.   Yes [provider]  donepezil (ARICEPT) 10 MG tablet Take 1 tablet (10 mg total) by mouth at bedtime. Patient not taking: Reported on 08/15/2016 06/29/15   Loletha Grayer, MD     Allergies Penicillins   Family History  Problem Relation Age of Onset  . Hypertension Mother     Social History Social History  Substance Use Topics  . Smoking status: Never Smoker  . Smokeless tobacco: Never Used  . Alcohol use No    Review of Systems  Constitutional:   No fever or chills.  ENT:   No sore throat. No rhinorrhea. Cardiovascular:   No chest pain 2 days ago, none today.Marland Kitchen Respiratory:   No dyspnea or cough. Gastrointestinal:   Negative for abdominal pain, vomiting and diarrhea.  Musculoskeletal:   Negative for focal pain or swelling All other systems reviewed and are negative except as documented above in ROS and HPI.  ____________________________________________   PHYSICAL EXAM:  VITAL SIGNS: ED Triage Vitals  Enc Vitals Group     BP 08/15/16 1730 112/68     Pulse Rate 08/15/16 1730 (!) 51     Resp 08/15/16 1730 20     Temp --      Temp src --      SpO2  08/15/16 1730 100 %     Weight 08/15/16 1724 221 lb (100.2 kg)     Height 08/15/16 1724 5\' 11"  (1.803 m)     Head Circumference --      Peak Flow --      Pain Score --      Pain Loc --      Pain Edu? --      Excl. in [place]? --     Vital signs reviewed, nursing assessments reviewed.   Constitutional:   Alert and oriented To person and place. Well appearing and in no distress. Eyes:   No scleral icterus. No conjunctival pallor. PERRL. EOMI.  No nystagmus. ENT   Head:   Normocephalic and atraumatic.   Nose:   No congestion/rhinnorhea.    Mouth/Throat:   MMM, no pharyngeal erythema. No peritonsillar mass.    Neck:   No meningismus. Full ROM. No crepitus Hematological/Lymphatic/Immunilogical:   No cervical lymphadenopathy. Cardiovascular:   Bradycardia heart rate 50. Symmetric bilateral radial and DP pulses.  No murmurs.  Respiratory:   Normal respiratory effort without tachypnea/retractions. Breath sounds are clear and equal bilaterally. No wheezes/rales/rhonchi. Gastrointestinal:   Soft and nontender. Non distended. There is no CVA tenderness.  No rebound, rigidity, or guarding. No bruit. No pulsatile mass Genitourinary:   deferred Musculoskeletal:   Normal range of motion in all extremities. No joint effusions.  No lower extremity tenderness.  No edema. Neurologic:   Normal speech and language. Impaired memory CN 2-10 normal. Motor grossly intact. No gross focal neurologic deficits are appreciated.  Skin:    Skin is warm, dry and intact. No rash noted.  No petechiae, purpura, or bullae.  ____________________________________________    LABS (pertinent positives/negatives) (all labs ordered are listed, but only abnormal results are displayed) Labs Reviewed  URINALYSIS, COMPLETE (UACMP) WITH MICROSCOPIC - Abnormal; Notable for the following:       Result Value   Color, Urine YELLOW (*)    APPearance HAZY (*)    Specific Gravity, Urine 1.039 (*)    Hgb urine dipstick  SMALL (*)    Protein, ur 30 (*)    All other components within normal limits  URINE CULTURE  TROPONIN I   ____________________________________________   EKG  Interpreted by me Sinus bradycardia rate of 51, normal axis and intervals. Normal QRS ST segments and T waves.  ____________________________________________    [id number]  Dg Chest 2 View  Result Date: 08/15/2016 CLINICAL DATA:  Chest pain EXAM: CHEST  2 VIEW COMPARISON:  06/27/2015 FINDINGS: Left base atelectasis. Right lung is clear. Heart is normal size. No effusions or acute bony abnormality. Old healed left rib fractures. IMPRESSION: Left base atelectasis. Electronically Signed   By: Rolm Baptise M.D.   On: 08/15/2016 08:15  Ct Angio Chest Aorta W And/or Wo Contrast  Result Date: 08/15/2016 CLINICAL DATA:  Hypotension and bradycardia. EXAM: CT ANGIOGRAPHY CHEST WITH CONTRAST TECHNIQUE: Multidetector CT imaging of the chest was performed using the standard protocol during bolus administration of intravenous contrast. Multiplanar CT image reconstructions and MIPs were obtained to evaluate the vascular anatomy. CONTRAST:  75 mL of Isovue 370 COMPARISON:  None. FINDINGS: Cardiovascular: Coronary artery calcifications identified. The heart size is normal. Central pulmonary arteries are normal in caliber. The study was not tailored to evaluate pulmonary arteries but no central emboli identified. The ascending thoracic aorta measures 4.4 cm in AP dimension on series 4, image 45. No dissection. Mild atherosclerosis. Branching vessels are normal. Mediastinum/Nodes: No enlarged mediastinal, hilar, or axillary lymph nodes. Thyroid gland, trachea, and esophagus demonstrate no significant findings. Lungs/Pleura: The central airways are normal. No pneumothorax. A calcified nodule along the right major fissure seen on image 26. No other suspicious nodules or masses. Focal subpleural fat laterally on the left on image 54 of no acute significance.  Atelectasis and/ or scarring in the left base. Upper Abdomen: No acute abnormality. Multiple small cysts in the liver. Musculoskeletal: No chest wall abnormality. No acute or significant osseous findings. Review of the MIP images confirms the above findings. IMPRESSION: 1. Thoracic aortic aneurysm measuring up to 4.4 cm with no dissection. Recommend annual imaging followup by CTA or MRA. This recommendation follows 2010 ACCF/AHA/AATS/ACR/ASA/SCA/SCAI/SIR/STS/SVM Guidelines for the Diagnosis and Management of Patients with Thoracic Aortic Disease. Circulation. 2010; 121: [id number] 2. No acute abnormalities. Electronically Signed   By: Dorise Bullion III M.D   On: 08/15/2016 19:23    ____________________________________________   PROCEDURES Procedures  ____________________________________________   INITIAL IMPRESSION / ASSESSMENT AND PLAN / ED COURSE  Pertinent labs & imaging results that were available during my care of the patient were reviewed by me and considered in my medical decision making (see chart for details).   Clinical Course as of Aug 15 2124  Nancy Fetter Aug 15, 2016  1732 Pt denies sx at present time. Well appearing, NAD. VS unremarkable. Will recheck trop, d/w family when they arrive.  [PS]  1801 Discussed with family at bedside. They deny anginal symptoms. They're just concerned that when they checked the blood pressure at home while the patient was sitting in a chair it was low at about 70/40. Seemed low for EMS to but normal on arrival here. He has a history of 4.6 cm ascending aorta aneurysm, last imaged July 2017. We'll get CT angiogram of the chest with today's episode of hypotension.  [PS]    Clinical Course User Index [PS] Carrie Mew, MD     ----------------------------------------- 9:24 PM on 08/15/2016 -----------------------------------------  Troponin negative. CT angiogram shows no acute changes or new pathology. Overall negative workup. Symptoms are very  vague and poorly defined, but is specifically not exertional, not consistent with angina. I discussed being readmitted to the hospital with the patient and family present, but the patient strongly prefers to go home and apparently family is comfortable with this. They will call cardiology tomorrow to follow-up. Return precautions discussed. Despite his inability to give a clear history, I have low suspicion for ACS PE or other serious pathology at this time. Vital signs are unremarkable, patient is calm and comfortable and energetic.  ____________________________________________   FINAL CLINICAL IMPRESSION(S) / ED DIAGNOSES  Final diagnoses:  Nonspecific chest pain  Low blood pressure reading      New Prescriptions   No medications  on file     Portions of this note were generated with dragon dictation software. Dictation errors may occur despite best attempts at proofreading.    Carrie Mew, MD 08/15/16 2127

## 2016-08-15 NOTE — H&P (Signed)
[facility] [place] NAME: Alex [name]    MR#:  [id number]  DATE OF BIRTH:  06-22-1940  DATE OF ADMISSION:  08/15/2016  PRIMARY CARE PHYSICIAN: Patient, No Pcp Per   REQUESTING/REFERRING PHYSICIAN: Dr. Charlotte Crumb  CHIEF COMPLAINT:   Chief Complaint  Patient presents with  . Chest Pain    HISTORY OF PRESENT ILLNESS:  Alex [name]  is a 76 y.o. male with a known history of  Alzheimer's dementia, BPH, CAD status post stents in 2011 and prior, hypertension presents to hospital from home secondary to chest pain that started this morning. Due to his dementia, patient is very unreliable historian. He is very comfortable at this time and denies any chest pain. He says he woke up and felt a discomfort in his chest and is pointing towards the right side of the chest. Unable to describe the intensity of the pain or to severity. There was minimal radiation of pain to the back between his shoulder blades. Wife is at bedside and confirms the history. Denies any nausea or vomiting. No fevers or chills. No dyspnea or diaphoresis. His first troponin is negative. His last stress test was about a year ago which was negative here in the hospital. He is being admitted for further workup of his chest pain given his cardiac history.  PAST MEDICAL HISTORY:   Past Medical History:  Diagnosis Date  . Alzheimer's dementia   . BPH (benign prostatic hyperplasia)   . CAD (coronary artery disease)   . Dementia   . Hypertension     PAST SURGICAL HISTORY:   Past Surgical History:  Procedure Laterality Date  . CORONARY ANGIOPLASTY WITH STENT PLACEMENT    . HERNIA REPAIR      SOCIAL HISTORY:   Social History  Substance Use Topics  . Smoking status: Never Smoker  . Smokeless tobacco: Never Used  . Alcohol use No    FAMILY HISTORY:   Family History  Problem Relation Age of Onset  . Hypertension Mother     DRUG ALLERGIES:   Allergies    Allergen Reactions  . Penicillins Other (See Comments)    Unknown reaction, childhood  Has patient had a PCN reaction causing immediate rash, facial/tongue/throat swelling, SOB or lightheadedness with hypotension: Yes Has patient had a PCN reaction causing severe rash involving mucus membranes or skin necrosis: No Has patient had a PCN reaction that required hospitalization: No Has patient had a PCN reaction occurring within the last 10 years: No If all of the above answers are "NO", then may proceed with Cephalosporin use.     REVIEW OF SYSTEMS:   Review of Systems  Constitutional: Negative for chills, fever, malaise/fatigue and weight loss.  HENT: Negative for ear discharge, ear pain, hearing loss, nosebleeds and tinnitus.   Eyes: Negative for blurred vision, double vision and photophobia.  Respiratory: Negative for cough, hemoptysis, shortness of breath and wheezing.   Cardiovascular: Negative for chest pain, palpitations, orthopnea and leg swelling.  Gastrointestinal: Negative for abdominal pain, constipation, diarrhea, heartburn, melena, nausea and vomiting.  Genitourinary: Negative for dysuria, frequency, hematuria and urgency.  Musculoskeletal: Negative for back pain, myalgias and neck pain.  Skin: Negative for rash.  Neurological: Negative for dizziness, tingling, tremors, sensory change, speech change, focal weakness and headaches.  Endo/Heme/Allergies: Does not bruise/bleed easily.  Psychiatric/Behavioral: Negative for depression.    MEDICATIONS AT HOME:   Prior to Admission medications   Medication Sig Start Date End  Date Taking? Authorizing Provider  amLODipine (NORVASC) 5 MG tablet Take 5 mg by mouth daily.   Yes [provider]  aspirin EC 81 MG tablet Take 81 mg by mouth daily.   Yes [provider]  finasteride (PROSCAR) 5 MG tablet Take 1 tablet (5 mg total) by mouth daily. 06/29/15  Yes Wieting, Richard, MD  furosemide (LASIX) 40 MG tablet Take  40 mg by mouth daily. 08/12/16  Yes [provider]  ibuprofen (ADVIL,MOTRIN) 200 MG tablet Take 200 mg by mouth 2 (two) times daily.   Yes [provider]  lisinopril (PRINIVIL,ZESTRIL) 40 MG tablet Take 40 mg by mouth daily.   Yes [provider]  metoprolol succinate (TOPROL-XL) 50 MG 24 hr tablet Take 50 mg by mouth daily. Take with or immediately following a meal.   Yes [provider]  pravastatin (PRAVACHOL) 80 MG tablet Take 80 mg by mouth daily.   Yes [provider]  rivastigmine (EXELON) 6 MG capsule Take 6 mg by mouth 2 (two) times daily.   Yes [provider]  donepezil (ARICEPT) 10 MG tablet Take 1 tablet (10 mg total) by mouth at bedtime. Patient not taking: Reported on 08/15/2016 06/29/15   Loletha Grayer, MD      VITAL SIGNS:  Blood pressure (!) 150/84, pulse (!) 47, temperature 97.8 F (36.6 C), temperature source Oral, resp. rate 14, height 6\' 2"  (1.88 m), weight 99.8 kg (220 lb), SpO2 98 %.  PHYSICAL EXAMINATION:   Physical Exam  GENERAL:  76 y.o.-year-old patient lying in the bed with no acute distress.  EYES: Pupils equal, round, reactive to light and accommodation. No scleral icterus. Extraocular muscles intact.  HEENT: Head atraumatic, normocephalic. Oropharynx and nasopharynx clear.  NECK:  Supple, no jugular venous distention. No thyroid enlargement, no tenderness.  LUNGS: Normal breath sounds bilaterally, no wheezing, rales,rhonchi or crepitation. No use of accessory muscles of respiration.  CARDIOVASCULAR: S1, S2 normal. No murmurs, rubs, or gallops.  ABDOMEN: Soft, nontender, nondistended. Bowel sounds present. No organomegaly or mass.  EXTREMITIES: No pedal edema, cyanosis, or clubbing.  NEUROLOGIC: Cranial nerves II through XII are intact. Muscle strength 5/5 in all extremities. Sensation intact. Gait not checked.  PSYCHIATRIC: The patient is alert and oriented x 2, short term memory loss, repeats the same  questions.  SKIN: No obvious rash, lesion, or ulcer.   LABORATORY PANEL:   CBC  Recent Labs Lab 08/15/16 0749  WBC 5.6  HGB 13.4  HCT 39.7*  PLT 124*   ------------------------------------------------------------------------------------------------------------------  Chemistries   Recent Labs Lab 08/15/16 0749  NA 139  K 3.6  CL 105  CO2 29  GLUCOSE 96  BUN 16  CREATININE 1.27*  CALCIUM 9.0   ------------------------------------------------------------------------------------------------------------------  Cardiac Enzymes  Recent Labs Lab 08/15/16 0749  TROPONINI <0.03   ------------------------------------------------------------------------------------------------------------------  RADIOLOGY:  Dg Chest 2 View  Result Date: 08/15/2016 CLINICAL DATA:  Chest pain EXAM: CHEST  2 VIEW COMPARISON:  06/27/2015 FINDINGS: Left base atelectasis. Right lung is clear. Heart is normal size. No effusions or acute bony abnormality. Old healed left rib fractures. IMPRESSION: Left base atelectasis. Electronically Signed   By: Rolm Baptise M.D.   On: 08/15/2016 08:15    EKG:   Orders placed or performed during the hospital encounter of 08/15/16  . ED EKG within 10 minutes  . ED EKG within 10 minutes  . EKG 12-Lead  . EKG 12-Lead    IMPRESSION AND PLAN:   Georgetta Haber  is a 76 y.o. male with a known history of  Alzheimer's dementia, BPH, CAD status post stents in 2011 and prior, hypertension presents to hospital from home secondary to chest pain that started this morning.  #1 chest pain-likely musculoskeletal chest pain. However her angina cannot be ruled out given his cardiac history. -Admit to telemetry under observation. Recycle troponins. -No EKG changes. -Continue cardiac medications including aspirin, lisinopril, statin and metoprolol.  #2 sinus bradycardia-hold metoprolol if heart rate is less than 50.  #3 hypertension-continue home medications  #4  dementia-- short term memory loss. very conversational. Continue his Exelon  #5 DVT prophylaxis-Lovenox    All the records are reviewed and case discussed with ED provider. Management plans discussed with the patient, family and they are in agreement.  CODE STATUS: Full Code  TOTAL TIME TAKING CARE OF THIS PATIENT: 50 minutes.    Gladstone Lighter M.D on 08/15/2016 at 9:29 AM  Between 7am to 6pm - Pager - [phone]  After 6pm go to [email or web address] - password [email or web address] [facility]  Office  [phone]  CC: Primary care physician; Patient, No Pcp Per

## 2016-08-15 NOTE — Discharge Instructions (Signed)
Return to the ED if you have new or worsened symptoms, including passing out or recurrent severe chest pain or shortness of breath. Follow-up with cardiology tomorrow, and continue taking all your medicines as usual.  Your blood tests this afternoon were unremarkable. CT scan of your chest was unremarkable as well and shows no enlargement of the aneurysm of your ascending aorta.

## 2016-08-15 NOTE — ED Notes (Signed)
Pt still unable to give urine sample

## 2016-08-15 NOTE — ED Provider Notes (Signed)
[facility] Emergency Department Provider Note  ____________________________________________   I have reviewed the triage vital signs and the nursing notes.   HISTORY  Chief Complaint Chest Pain    HPI Alex [name] is a 76 y.o. male who presents today complaining of chest discomfort. Patient does suffer from Alzheimer's and dementia and his history is somewhat limited. He states since last night having occasional chest discomfort. "A hanging feeling". Denies pain at this time. Very limited history about how long it lasts, several minutes at least. He states he is not short of breath, denies pleuritic chest pain, or recent swelling in his legs. The patient states that he has otherwise been in his normal state of health. He Coming Any Alleviating or Aggravating Features. Patient's History, As Noted, Is Limited by Dementia. Wife Does Supplement His History. The Patient Has Had stents placed in his heart. His last stress test was one year ago at this facility.     Past Medical History:  Diagnosis Date  . Alzheimer's dementia   . BPH (benign prostatic hyperplasia)   . CAD (coronary artery disease)   . Dementia   . Hypertension     Patient Active Problem List   Diagnosis Date Noted  . Chest pain 06/27/2015  . Uncontrolled hypertension 06/27/2015  . Bradycardia 06/27/2015  . Pleural thickening 06/27/2015    Past Surgical History:  Procedure Laterality Date  . CORONARY ANGIOPLASTY WITH STENT PLACEMENT    . HERNIA REPAIR      Prior to Admission medications   Medication Sig Start Date End Date Taking? Authorizing Provider  aspirin EC 81 MG tablet Take 81 mg by mouth daily.    [provider]  donepezil (ARICEPT) 10 MG tablet Take 1 tablet (10 mg total) by mouth at bedtime. 06/29/15   Loletha Grayer, MD  finasteride (PROSCAR) 5 MG tablet Take 1 tablet (5 mg total) by mouth daily. 06/29/15   Loletha Grayer, MD  pravastatin (PRAVACHOL) 80 MG  tablet Take 80 mg by mouth daily.    [provider]    Allergies Penicillins  Family History  Problem Relation Age of Onset  . Hypertension Mother     Social History Social History  Substance Use Topics  . Smoking status: Never Smoker  . Smokeless tobacco: Never Used  . Alcohol use No    Review of Systems Constitutional: No fever/chills Eyes: No visual changes. ENT: No sore throat. No stiff neck no neck pain Cardiovascular: Positive chest pain. Respiratory: Denies shortness of breath. Gastrointestinal:   no vomiting.  No diarrhea.  No constipation. Genitourinary: Negative for dysuria. Musculoskeletal: Negative lower extremity swelling Skin: Negative for rash. Neurological: Negative for severe headaches, focal weakness or numbness. 10-point ROS otherwise negative.  ____________________________________________   PHYSICAL EXAM:  VITAL SIGNS: ED Triage Vitals  Enc Vitals Group     BP 08/15/16 0750 (!) 162/71     Pulse Rate 08/15/16 0750 (!) 50     Resp 08/15/16 0750 16     Temp 08/15/16 0750 97.8 F (36.6 C)     Temp Source 08/15/16 0750 Oral     SpO2 08/15/16 0750 100 %     Weight 08/15/16 0746 220 lb (99.8 kg)     Height 08/15/16 0746 6\' 2"  (1.88 m)     Head Circumference --      Peak Flow --      Pain Score 08/15/16 0746 6     Pain Loc --  Pain Edu? --      Excl. in [place]? --     Constitutional: Alert and oriented. Well appearing and in no acute distress. Eyes: Conjunctivae are normal. PERRL. EOMI. Head: Atraumatic. Nose: No congestion/rhinnorhea. Mouth/Throat: Mucous membranes are moist.  Oropharynx non-erythematous. Neck: No stridor.   Nontender with no meningismus Cardiovascular: Normal rate, regular rhythm. Grossly normal heart sounds.  Good peripheral circulation. Respiratory: Normal respiratory effort.  No retractions. Lungs CTAB. Abdominal: Soft and nontender. No distention. No guarding no rebound Back:  There is no focal tenderness  or step off.  there is no midline tenderness there are no lesions noted. there is no CVA tenderness Musculoskeletal: No lower extremity tenderness, no upper extremity tenderness. No joint effusions, no DVT signs strong distal pulses no edema Neurologic:  Normal speech and language. No gross focal neurologic deficits are appreciated.  Skin:  Skin is warm, dry and intact. No rash noted. Psychiatric: Mood and affect are normal. Speech and behavior are normal.  ____________________________________________   LABS (all labs ordered are listed, but only abnormal results are displayed)  Labs Reviewed  CBC - Abnormal; Notable for the following:       Result Value   HCT 39.7 (*)    Platelets 124 (*)    All other components within normal limits  BASIC METABOLIC PANEL  TROPONIN I   ____________________________________________  EKG  I personally interpreted any EKGs ordered by me or triage Sinus bradycardia rate 46 bpm, normal axis no acute ST elevation or depression except for bradycardia, unremarkable EKG ____________________________________________  RADIOLOGY  I reviewed any imaging ordered by me or triage that were performed during my shift and, if possible, patient and/or family made aware of any abnormal findings. ____________________________________________   PROCEDURES  Procedure(s) performed: None  Procedures  Critical Care performed: None  ____________________________________________   INITIAL IMPRESSION / ASSESSMENT AND PLAN / ED COURSE  Pertinent labs & imaging results that were available during my care of the patient were reviewed by me and considered in my medical decision making (see chart for details).  Patient with a history of ACS and stents placed presents today with chest pressure which is somewhat poorly described given his baseline dementia. He is not having pain at this moment. We'll give him aspirin. Workup is reassuring thus far but given his history and  limitations to his inability to tolerate by the symptoms I think admission for observation and further cardiac evaluation is the best course for this patient. We will discuss with the hospitalist service.    ____________________________________________   FINAL CLINICAL IMPRESSION(S) / ED DIAGNOSES  Final diagnoses:  None      This chart was dictated using voice recognition software.  Despite best efforts to proofread,  errors can occur which can change meaning.      Schuyler Amor, MD 08/15/16 0830

## 2016-08-15 NOTE — Progress Notes (Signed)
Patient states he does not want to stay here and that this is a free [place] and that he fought in [place] for his freedom and we cant hold him hostage. Dr. Tressia Miners notified of patients desire to leave. Patient advised to at least wait for his wife to get here with the car due to the weather I do not want him to walk home. Patient stated that he could do whatever he wants. Daughter at bedside and tried to reason with patient. He proceeded to get up and try to leave. Patient refusing to let daughter give him a ride Security alert called. Officers arrived and I expressed My concern that he has Alzheimers and I do not want him to walk home on the streets. They attempted to reason with him. Patient would not wait for wife to arrive to reason with him. Daughter and patient signed AMA paper. Officers escorted patient and daughter downstairs.

## 2016-08-15 NOTE — ED Notes (Signed)
Pt wheeled to Room 248; pt placed in bed, on monitor with call button given to pt; notified RN that pt was in room

## 2016-08-15 NOTE — ED Triage Notes (Signed)
Pt left [facility] earlier today - family called ems for hypotension, bradycardia and diaphoretic pt.pt has dementia and unable to give a good history. Family is on their way

## 2016-08-16 NOTE — Discharge Summary (Signed)
[facility] [place] NAME: Alex [name]    MR#:  [id number]  DATE OF BIRTH:  08/08/1940  DATE OF ADMISSION:  08/15/2016   ADMITTING PHYSICIAN: Gladstone Lighter, MD  DATE OF DISCHARGE: LEFT AGAINST MEDICAL ADVISE ON  08/15/2016  1:50 PM  PRIMARY CARE PHYSICIAN: Patient, No Pcp Per   ADMISSION DIAGNOSIS:   Chest pain, unspecified type [R07.9]  DISCHARGE DIAGNOSIS:   Active Problems:   Chest pain   SECONDARY DIAGNOSIS:   Past Medical History:  Diagnosis Date  . Alzheimer's dementia   . BPH (benign prostatic hyperplasia)   . CAD (coronary artery disease)   . Dementia   . Hypertension     HOSPITAL COURSE:   Alex [name]  is a 76 y.o. male with a known history of  Alzheimer's dementia, BPH, CAD status post stents in 2011 and prior, hypertension presents to hospital from home secondary to chest pain that started this morning.  #1 chest pain-likely musculoskeletal chest pain. However her angina cannot be ruled out given his cardiac history. --No EKG changes.So he was admitted to telemetry to rule out MI. First troponins have remained negative. -However patient was very impulsive and the family decided to take him home. Outpatient follow-up recommended. -Continue cardiac medications including aspirin, lisinopril, statin and metoprolol.  #2 sinus bradycardia-hold metoprolol if heart rate is less than 50.  #3 hypertension-continue home medications  #4 dementia-- short term memory loss.Continue his Ex[employer] -Very impulsive behavior and decided to leave [place]. Family was at bedside.   DISCHARGE CONDITIONS:   Guarded  CONSULTS OBTAINED:   None  DRUG ALLERGIES:   Allergies  Allergen Reactions  . Penicillins Other (See Comments)    Unknown reaction, childhood  Has patient had a PCN reaction causing immediate rash, facial/tongue/throat swelling, SOB or lightheadedness with hypotension: Yes Has  patient had a PCN reaction causing severe rash involving mucus membranes or skin necrosis: No Has patient had a PCN reaction that required hospitalization: No Has patient had a PCN reaction occurring within the last 10 years: No If all of the above answers are "NO", then may proceed with Cephalosporin use.    DISCHARGE MEDICATIONS:   Allergies as of 08/15/2016      Reactions   Penicillins Other (See Comments)   Unknown reaction, childhood  Has patient had a PCN reaction causing immediate rash, facial/tongue/throat swelling, SOB or lightheadedness with hypotension: Yes Has patient had a PCN reaction causing severe rash involving mucus membranes or skin necrosis: No Has patient had a PCN reaction that required hospitalization: No Has patient had a PCN reaction occurring within the last 10 years: No If all of the above answers are "NO", then may proceed with Cephalosporin use.      Medication List    ASK your doctor about these medications   amLODipine 5 MG tablet Commonly known as:  NORVASC Take 5 mg by mouth daily.   aspirin EC 81 MG tablet Take 81 mg by mouth daily.   donepezil 10 MG tablet Commonly known as:  ARICEPT Take 1 tablet (10 mg total) by mouth at bedtime.   finasteride 5 MG tablet Commonly known as:  PROSCAR Take 1 tablet (5 mg total) by mouth daily.   furosemide 40 MG tablet Commonly known as:  LASIX Take 40 mg by mouth daily.   ibuprofen 200 MG tablet Commonly known as:  ADVIL,MOTRIN Take 200 mg by mouth 2 (two) times daily.  lisinopril 40 MG tablet Commonly known as:  PRINIVIL,ZESTRIL Take 40 mg by mouth daily.   metoprolol succinate 50 MG 24 hr tablet Commonly known as:  TOPROL-XL Take 50 mg by mouth daily. Take with or immediately following a meal.   pravastatin 80 MG tablet Commonly known as:  PRAVACHOL Take 80 mg by mouth daily.   rivastigmine 6 MG capsule Commonly known as:  EXELON Take 6 mg by mouth 2 (two) times daily.         DISCHARGE INSTRUCTIONS:   1. Follow up with your cardiologist in 1 week.   If you experience worsening of your admission symptoms, develop shortness of breath, life threatening emergency, suicidal or homicidal thoughts you must seek medical attention immediately by calling 911 or calling your MD immediately  if symptoms less severe.  You Must read complete instructions/literature along with all the possible adverse reactions/side effects for all the Medicines you take and that have been prescribed to you. Take any new Medicines after you have completely understood and accpet all the possible adverse reactions/side effects.   Please note  You were cared for by a hospitalist during your hospital stay. If you have any questions about your discharge medications or the care you received while you were in the hospital after you are discharged, you can call the unit and asked to speak with the hospitalist on call if the hospitalist that took care of you is not available. Once you are discharged, your primary care physician will handle any further medical issues. Please note that NO REFILLS for any discharge medications will be authorized once you are discharged, as it is imperative that you return to your primary care physician (or establish a relationship with a primary care physician if you do not have one) for your aftercare needs so that they can reassess your need for medications and monitor your lab values.    On the day of Discharge:  VITAL SIGNS:   Blood pressure (!) 171/80, pulse (!) 47, temperature 97.5 F (36.4 C), temperature source Oral, resp. rate 17, height 5\' 11"  (1.803 m), weight 100.6 kg (221 lb 12.8 oz), SpO2 99 %.  PHYSICAL EXAMINATION:    GENERAL:  76 y.o.-year-old patient lying in the bed with no acute distress.  EYES: Pupils equal, round, reactive to light and accommodation. No scleral icterus. Extraocular muscles intact.  HEENT: Head atraumatic, normocephalic.  Oropharynx and nasopharynx clear.  NECK:  Supple, no jugular venous distention. No thyroid enlargement, no tenderness.  LUNGS: Normal breath sounds bilaterally, no wheezing, rales,rhonchi or crepitation. No use of accessory muscles of respiration.  CARDIOVASCULAR: S1, S2 normal. No murmurs, rubs, or gallops.  ABDOMEN: Soft, nontender, nondistended. Bowel sounds present. No organomegaly or mass.  EXTREMITIES: No pedal edema, cyanosis, or clubbing.  NEUROLOGIC: Cranial nerves II through XII are intact. Muscle strength 5/5 in all extremities. Sensation intact. Gait not checked.  PSYCHIATRIC: The patient is alert and oriented x 2, short term memory loss, repeats the same questions.  SKIN: No obvious rash, lesion, or ulcer.    DATA REVIEW:   CBC  Recent Labs Lab 08/15/16 0749  WBC 5.6  HGB 13.4  HCT 39.7*  PLT 124*    Chemistries   Recent Labs Lab 08/15/16 0749  NA 139  K 3.6  CL 105  CO2 29  GLUCOSE 96  BUN 16  CREATININE 1.27*  CALCIUM 9.0     Microbiology Results  No results found for this or any previous visit.  RADIOLOGY:  Ct Angio Chest Aorta W And/or Wo Contrast  Result Date: 08/15/2016 CLINICAL DATA:  Hypotension and bradycardia. EXAM: CT ANGIOGRAPHY CHEST WITH CONTRAST TECHNIQUE: Multidetector CT imaging of the chest was performed using the standard protocol during bolus administration of intravenous contrast. Multiplanar CT image reconstructions and MIPs were obtained to evaluate the vascular anatomy. CONTRAST:  75 mL of Isovue 370 COMPARISON:  None. FINDINGS: Cardiovascular: Coronary artery calcifications identified. The heart size is normal. Central pulmonary arteries are normal in caliber. The study was not tailored to evaluate pulmonary arteries but no central emboli identified. The ascending thoracic aorta measures 4.4 cm in AP dimension on series 4, image 45. No dissection. Mild atherosclerosis. Branching vessels are normal. Mediastinum/Nodes: No enlarged  mediastinal, hilar, or axillary lymph nodes. Thyroid gland, trachea, and esophagus demonstrate no significant findings. Lungs/Pleura: The central airways are normal. No pneumothorax. A calcified nodule along the right major fissure seen on image 26. No other suspicious nodules or masses. Focal subpleural fat laterally on the left on image 54 of no acute significance. Atelectasis and/ or scarring in the left base. Upper Abdomen: No acute abnormality. Multiple small cysts in the liver. Musculoskeletal: No chest wall abnormality. No acute or significant osseous findings. Review of the MIP images confirms the above findings. IMPRESSION: 1. Thoracic aortic aneurysm measuring up to 4.4 cm with no dissection. Recommend annual imaging followup by CTA or MRA. This recommendation follows 2010 ACCF/AHA/AATS/ACR/ASA/SCA/SCAI/SIR/STS/SVM Guidelines for the Diagnosis and Management of Patients with Thoracic Aortic Disease. Circulation. 2010; 121: B353-G992 2. No acute abnormalities. Electronically Signed   By: Dorise Bullion III M.D   On: 08/15/2016 19:23     Management plans discussed with the patient, family and they are in agreement.  CODE STATUS:  Code Status History    Date Active Date Inactive Code Status Order ID Comments User Context   08/15/2016 10:17 AM 08/15/2016  4:51 PM Full Code [id number]  Gladstone Lighter, MD Inpatient   06/27/2015  7:38 PM 06/29/2015  2:14 PM Full Code [id number]  Sylvan Cheese, MD Inpatient      TOTAL TIME TAKING CARE OF THIS PATIENT: 37 minutes.    Gladstone Lighter M.D on 08/16/2016 at 2:33 PM  Between 7am to 6pm - Pager - [phone]  After 6pm go to [email or web address] - Proofreader  [facility]  Office  [phone]  CC: Primary care physician; Patient, No Pcp Per   Note: This dictation was prepared with Dragon dictation along with smaller phrase technology. Any transcriptional errors that result from this process are  unintentional.

## 2016-08-17 LAB — URINE CULTURE: Culture: NO GROWTH

## 2017-01-07 ENCOUNTER — Emergency Department: Payer: Medicare Other

## 2017-01-07 ENCOUNTER — Emergency Department
Admission: EM | Admit: 2017-01-07 | Discharge: 2017-01-07 | Disposition: A | Discharge status: Home / Self Care | Payer: Medicare Other | Attending: Emergency Medicine | Admitting: Emergency Medicine

## 2017-01-07 DIAGNOSIS — Z79899 Other long term (current) drug therapy: Secondary | ICD-10-CM | POA: Diagnosis not present

## 2017-01-07 DIAGNOSIS — W108XXA Fall (on) (from) other stairs and steps, initial encounter: Secondary | ICD-10-CM | POA: Insufficient documentation

## 2017-01-07 DIAGNOSIS — G309 Alzheimer's disease, unspecified: Secondary | ICD-10-CM | POA: Insufficient documentation

## 2017-01-07 DIAGNOSIS — Y92099 Unspecified place in other non-institutional residence as the place of occurrence of the external cause: Secondary | ICD-10-CM | POA: Insufficient documentation

## 2017-01-07 DIAGNOSIS — S92152A Displaced avulsion fracture (chip fracture) of left talus, initial encounter for closed fracture: Secondary | ICD-10-CM | POA: Diagnosis not present

## 2017-01-07 DIAGNOSIS — T148XXA Other injury of unspecified body region, initial encounter: Secondary | ICD-10-CM

## 2017-01-07 DIAGNOSIS — Y998 Other external cause status: Secondary | ICD-10-CM | POA: Insufficient documentation

## 2017-01-07 DIAGNOSIS — Y93E9 Activity, other interior property and clothing maintenance: Secondary | ICD-10-CM | POA: Insufficient documentation

## 2017-01-07 DIAGNOSIS — I2510 Atherosclerotic heart disease of native coronary artery without angina pectoris: Secondary | ICD-10-CM | POA: Insufficient documentation

## 2017-01-07 DIAGNOSIS — I10 Essential (primary) hypertension: Secondary | ICD-10-CM | POA: Diagnosis not present

## 2017-01-07 DIAGNOSIS — Z23 Encounter for immunization: Secondary | ICD-10-CM | POA: Diagnosis not present

## 2017-01-07 DIAGNOSIS — Z7982 Long term (current) use of aspirin: Secondary | ICD-10-CM | POA: Diagnosis not present

## 2017-01-07 DIAGNOSIS — S99922A Unspecified injury of left foot, initial encounter: Secondary | ICD-10-CM | POA: Diagnosis present

## 2017-01-07 DIAGNOSIS — Z955 Presence of coronary angioplasty implant and graft: Secondary | ICD-10-CM | POA: Diagnosis not present

## 2017-01-07 IMAGING — CR DG FOOT COMPLETE 3+V*L*
3 series · 3 of 3 positions shown · non-contrast
Comparison: None.

CLINICAL DATA: Fell down steps. Left foot pain laterally. Initial
encounter.

EXAM:
LEFT FOOT - COMPLETE 3+ VIEW

[foot ap]
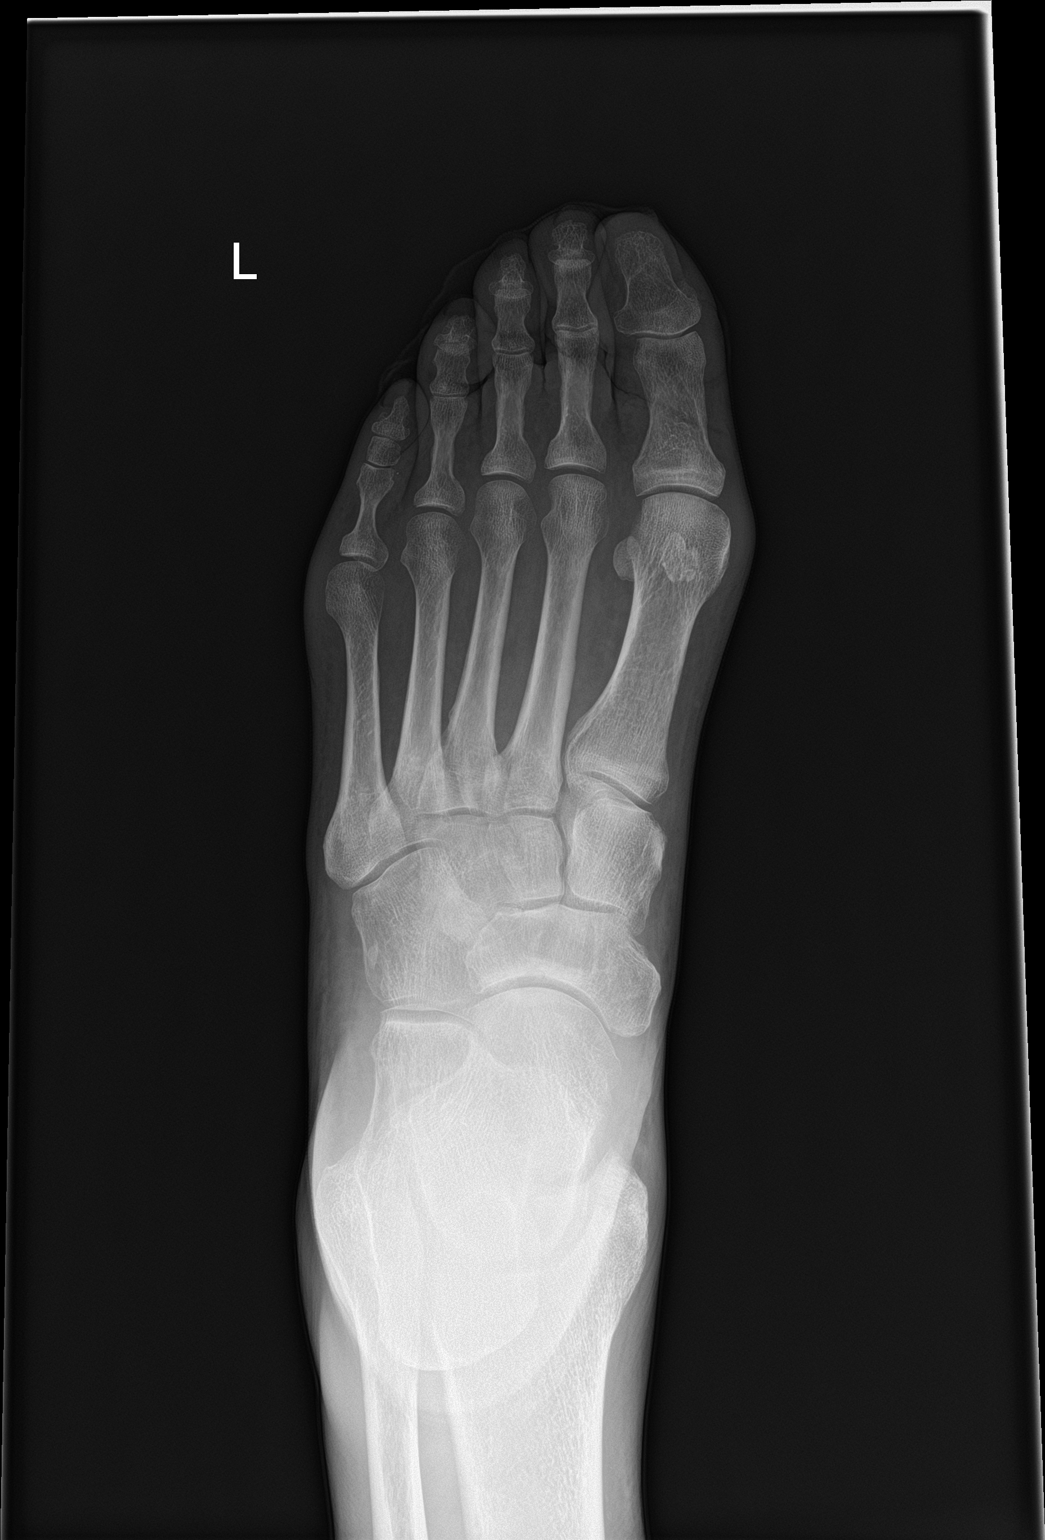

[foot obl]
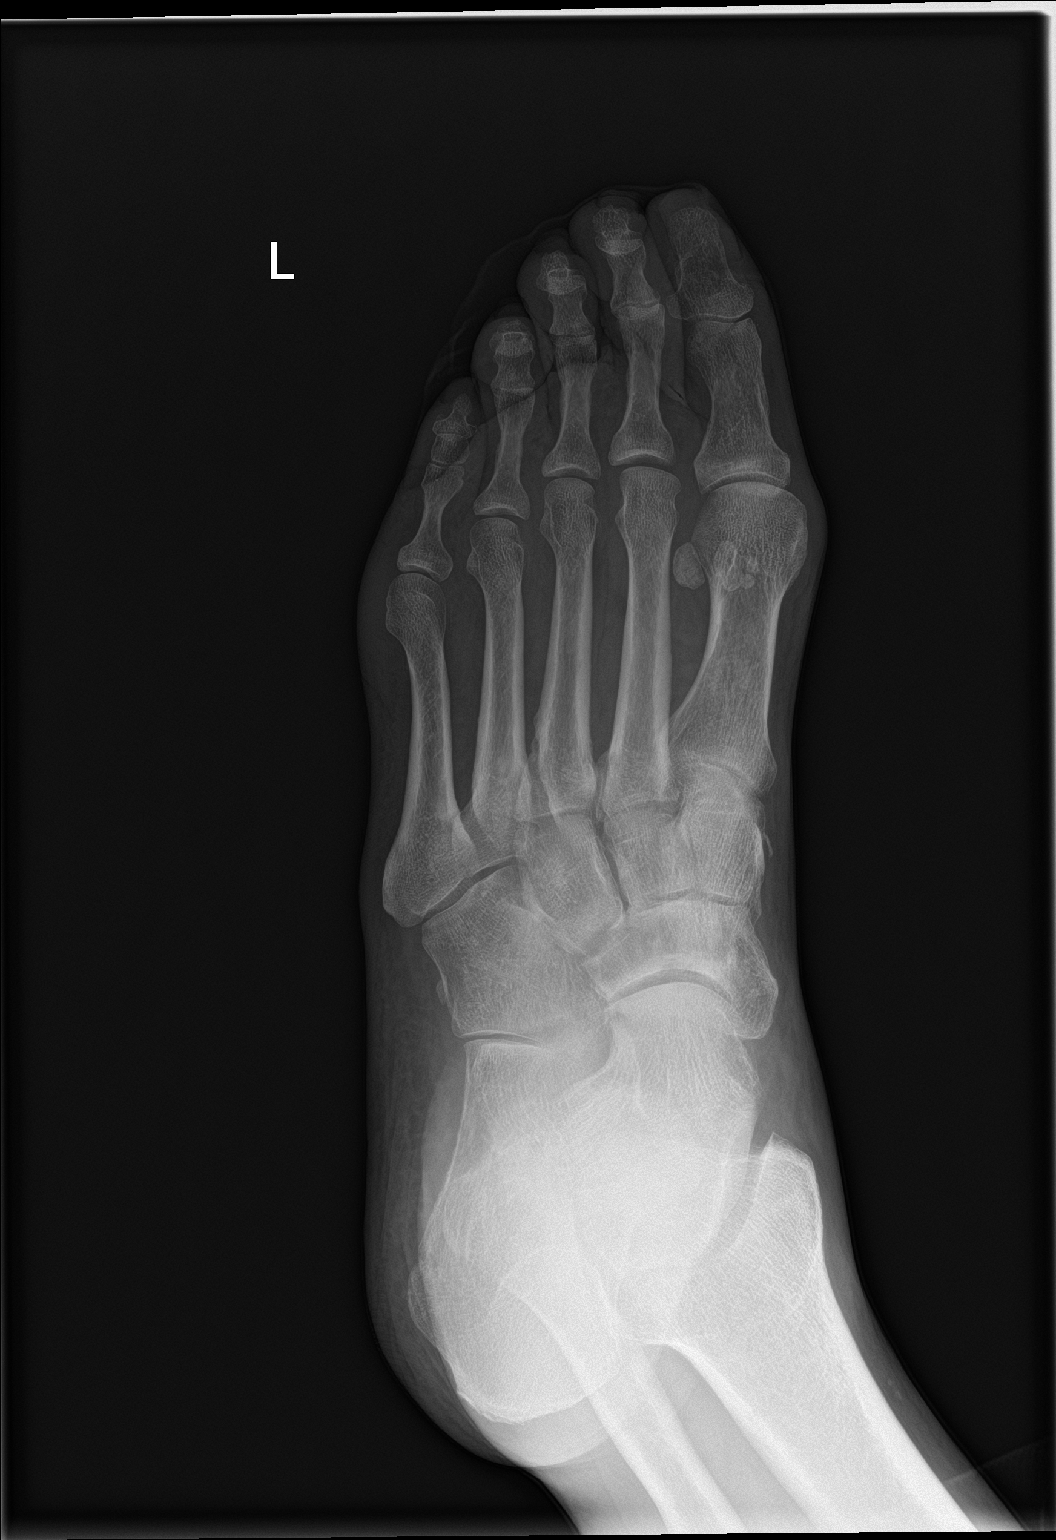

[foot lat]
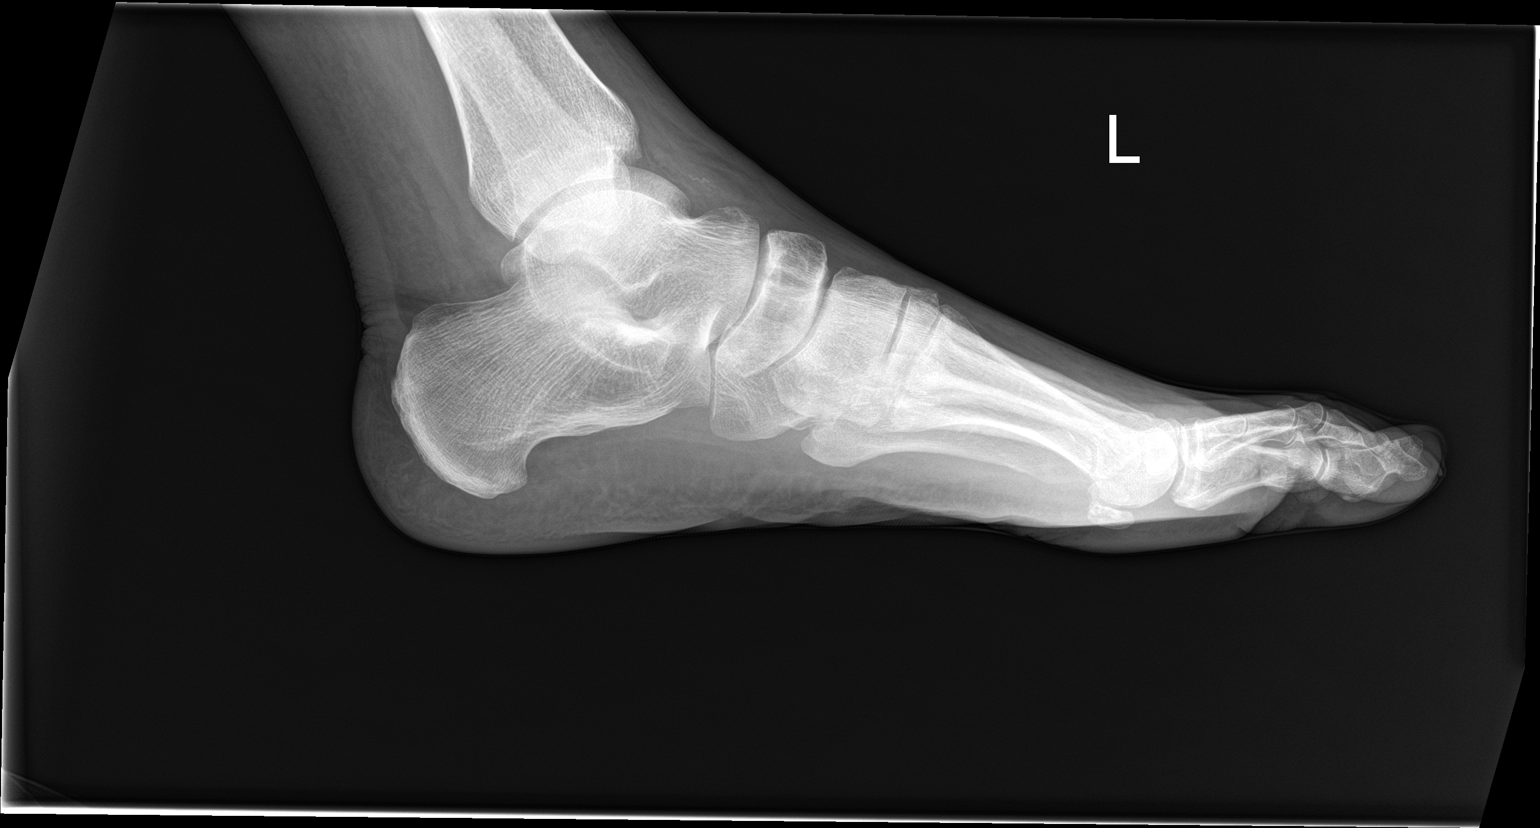

[3 of 3 positions shown; findings below may reference images not displayed]

FINDINGS: There is contour regularity with underlying lucency and questionable
cortical disruption involving the dorsal aspect of the anterior
talus on the lateral radiograph with overlying soft tissue swelling.
Cortical irregularity along the medial cuneiform on the oblique
radiograph is favored to be projectional rather than reflecting an
avulsion fraction given lack of significant overlying soft tissue
swelling and report of lateral rather than medial foot pain. There
is no dislocation. Scattered minimal degenerative spurring is noted.
Joint space widths are preserved.
IMPRESSION: Suspected avulsion fracture of the anterior talus.

## 2017-01-07 MED ORDER — TETANUS-DIPHTH-ACELL PERTUSSIS 5-2.5-18.5 LF-MCG/0.5 IM SUSP
0.5000 mL | Freq: Once | INTRAMUSCULAR | Status: AC
  Administered 2017-01-07: 0.5 mL via INTRAMUSCULAR
  Filled 2017-01-07: qty 0.5

## 2017-01-07 NOTE — ED Notes (Signed)
Pt states he lost power overnight and was up around 430 to help wife, slipped and fell injuring left foot.  Pt does have small abrasion to toes. Pt having difficulty bearing weight to left foot.

## 2017-01-07 NOTE — ED Provider Notes (Signed)
[facility] Emergency Department Provider Note  ____________________________________________  Time seen: Approximately 7:51 AM  I have reviewed the triage vital signs and the nursing notes.   HISTORY  Chief Complaint Foot Pain    HPI Alex [name] is a 76 y.o. male that presents to the emergency department for evaluation of left foot pain. Patient and wife state that he tripped on stairs trying to repair a water leak last night. Pain is primarily in the front of his foot and is worse with walking. He did not hit his head or lose consciousness. No alleviating measures have been attempted. No numbness, tingling.   Past Medical History:  Diagnosis Date  . Alzheimer's dementia   . BPH (benign prostatic hyperplasia)   . CAD (coronary artery disease)   . Dementia   . Hypertension     Patient Active Problem List   Diagnosis Date Noted  . Chest pain 06/27/2015  . Uncontrolled hypertension 06/27/2015  . Bradycardia 06/27/2015  . Pleural thickening 06/27/2015    Past Surgical History:  Procedure Laterality Date  . CORONARY ANGIOPLASTY WITH STENT PLACEMENT    . HERNIA REPAIR      Prior to Admission medications   Medication Sig Start Date End Date Taking? Authorizing Provider  amLODipine (NORVASC) 5 MG tablet Take 5 mg by mouth daily.    [provider]  aspirin EC 81 MG tablet Take 81 mg by mouth daily.    [provider]  donepezil (ARICEPT) 10 MG tablet Take 1 tablet (10 mg total) by mouth at bedtime. Patient not taking: Reported on 08/15/2016 06/29/15   Loletha Grayer, MD  finasteride (PROSCAR) 5 MG tablet Take 1 tablet (5 mg total) by mouth daily. 06/29/15   Loletha Grayer, MD  furosemide (LASIX) 40 MG tablet Take 40 mg by mouth daily. 08/12/16   [provider]  ibuprofen (ADVIL,MOTRIN) 200 MG tablet Take 200 mg by mouth 2 (two) times daily.    [provider]  lisinopril (PRINIVIL,ZESTRIL) 40 MG tablet Take 40 mg  by mouth daily.    [provider]  metoprolol succinate (TOPROL-XL) 50 MG 24 hr tablet Take 50 mg by mouth daily. Take with or immediately following a meal.    [provider]  pravastatin (PRAVACHOL) 80 MG tablet Take 80 mg by mouth daily.    [provider]  rivastigmine (EXELON) 6 MG capsule Take 6 mg by mouth 2 (two) times daily.    [provider]    Allergies Penicillins  Family History  Problem Relation Age of Onset  . Hypertension Mother     Social History Social History  Substance Use Topics  . Smoking status: Never Smoker  . Smokeless tobacco: Never Used  . Alcohol use No     Review of Systems  Constitutional: No fever/chills Cardiovascular: No chest pain. Respiratory: No SOB. Gastrointestinal: No abdominal pain.  No nausea, no vomiting.  Musculoskeletal: Positive for foot pain. Skin: Negative for rash, abrasions, lacerations, ecchymosis. Neurological: Negative for headaches, numbness or tingling   ____________________________________________   PHYSICAL EXAM:  VITAL SIGNS: ED Triage Vitals  Enc Vitals Group     BP 01/07/17 0628 (!) 156/80     Pulse Rate 01/07/17 0628 (!) 50     Resp 01/07/17 0628 18     Temp 01/07/17 0628 97.6 F (36.4 C)     Temp Source 01/07/17 0628 Oral     SpO2 01/07/17 0628 97 %     Weight 01/07/17  0629 221 lb (100.2 kg)     Height 01/07/17 0629 6' (1.829 m)     Head Circumference --      Peak Flow --      Pain Score 01/07/17 0628 10     Pain Loc --      Pain Edu? --      Excl. in [place]? --      Constitutional: Alert and oriented. Well appearing and in no acute distress. Eyes: Conjunctivae are normal. PERRL. EOMI. Head: Atraumatic. ENT:      Ears:      Nose: No congestion/rhinnorhea.      Mouth/Throat: Mucous membranes are moist.  Neck: No stridor.   Cardiovascular: Normal rate, regular rhythm.  Good peripheral circulation. Dorsalis pedis pulses symmetric bilaterally. Respiratory:  Normal respiratory effort without tachypnea or retractions. Lungs CTAB. Good air entry to the bases with no decreased or absent breath sounds. Musculoskeletal: Full range of motion to all extremities. No gross deformities appreciated. Tenderness to palpation over anterior talus. Full range of motion of foot. No visible swelling or bruising. Neurologic:  Normal speech and language. No gross focal neurologic deficits are appreciated.  Skin:  Skin is warm, dry and intact. No rash noted.   ____________________________________________   LABS (all labs ordered are listed, but only abnormal results are displayed)  Labs Reviewed - No data to display ____________________________________________  EKG   ____________________________________________  RADIOLOGY Robinette Haines, personally viewed and evaluated these images (plain radiographs) as part of my medical decision making, as well as reviewing the written report by the radiologist.  Dg Foot Complete Left  Result Date: 01/07/2017 CLINICAL DATA:  [place] down steps. Left foot pain laterally. Initial encounter. EXAM: LEFT FOOT - COMPLETE 3+ VIEW COMPARISON:  None. FINDINGS: There is contour regularity with underlying lucency and questionable cortical disruption involving the dorsal aspect of the anterior talus on the lateral radiograph with overlying soft tissue swelling. Cortical irregularity along the medial cuneiform on the oblique radiograph is favored to be projectional rather than reflecting an avulsion fraction given lack of significant overlying soft tissue swelling and report of lateral rather than medial foot pain. There is no dislocation. Scattered minimal degenerative spurring is noted. Joint space widths are preserved. IMPRESSION: Suspected avulsion fracture of the anterior talus. Electronically Signed   By: Logan Bores M.D.   On: 01/07/2017 07:22    ____________________________________________    PROCEDURES  Procedure(s)  performed:    Procedures    Medications  Tdap (BOOSTRIX) injection 0.5 mL (0.5 mLs Intramuscular Given 01/07/17 0745)     ____________________________________________   INITIAL IMPRESSION / ASSESSMENT AND PLAN / ED COURSE  Pertinent labs & imaging results that were available during my care of the patient were reviewed by me and considered in my medical decision making (see chart for details).  Review of the [place] CSRS was performed in accordance of the [place] prior to dispensing any controlled drugs.  Patient presents to emergency department for evaluation of left foot pain. Vital signs and exam are reassuring. X-ray consistent with possible avulsion fracture. There is tenderness to palpation at this point. Splint was placed. Crutches and walker were given.  Patient is to follow up with podiatry as directed. Patient is given ED precautions to return to the ED for any worsening or new symptoms.   ____________________________________________  FINAL CLINICAL IMPRESSION(S) / ED DIAGNOSES  Final diagnoses:  Avulsion fracture      NEW MEDICATIONS STARTED DURING THIS VISIT:  Discharge Medication  List as of 01/07/2017  7:52 AM          This chart was dictated using voice recognition software/Dragon. Despite best efforts to proofread, errors can occur which can change the meaning. Any change was purely unintentional.    Laban Emperor, PA-C 01/07/17 0838    Laban Emperor, PA-C 01/07/17 1791    Nena Polio, MD 01/07/17 [phone]

## 2017-01-07 NOTE — ED Triage Notes (Signed)
Pt fell down 4 steps, floor was wet and is now co left foot pain. Denies any other injury.

## 2017-01-07 NOTE — ED Notes (Signed)
Pt able to demonstrate use of crutches and walker independently.

## 2017-01-29 ENCOUNTER — Encounter: Payer: Self-pay | Admitting: Emergency Medicine

## 2017-01-29 ENCOUNTER — Emergency Department
Admission: EM | Admit: 2017-01-29 | Discharge: 2017-01-30 | Disposition: A | Discharge status: Home / Self Care | Payer: Medicare Other | Attending: Emergency Medicine | Admitting: Emergency Medicine

## 2017-01-29 DIAGNOSIS — M25552 Pain in left hip: Secondary | ICD-10-CM | POA: Insufficient documentation

## 2017-01-29 DIAGNOSIS — W19XXXA Unspecified fall, initial encounter: Secondary | ICD-10-CM

## 2017-01-29 DIAGNOSIS — I10 Essential (primary) hypertension: Secondary | ICD-10-CM | POA: Diagnosis not present

## 2017-01-29 DIAGNOSIS — I2510 Atherosclerotic heart disease of native coronary artery without angina pectoris: Secondary | ICD-10-CM | POA: Diagnosis not present

## 2017-01-29 DIAGNOSIS — M25551 Pain in right hip: Secondary | ICD-10-CM | POA: Diagnosis present

## 2017-01-29 DIAGNOSIS — G309 Alzheimer's disease, unspecified: Secondary | ICD-10-CM | POA: Diagnosis not present

## 2017-01-29 DIAGNOSIS — L089 Local infection of the skin and subcutaneous tissue, unspecified: Secondary | ICD-10-CM

## 2017-01-29 DIAGNOSIS — T148XXA Other injury of unspecified body region, initial encounter: Secondary | ICD-10-CM

## 2017-01-29 DIAGNOSIS — Z23 Encounter for immunization: Secondary | ICD-10-CM | POA: Insufficient documentation

## 2017-01-29 DIAGNOSIS — Z79899 Other long term (current) drug therapy: Secondary | ICD-10-CM | POA: Diagnosis not present

## 2017-01-29 LAB — CBC WITH DIFFERENTIAL/PLATELET
Basophils Absolute: 0.1 10*3/uL (ref 0–0.1)
Basophils Relative: 1 %
Eosinophils Absolute: 0.3 10*3/uL (ref 0–0.7)
Eosinophils Relative: 5 %
HCT: 41.2 % (ref 40.0–52.0)
Hemoglobin: 13.8 g/dL (ref 13.0–18.0)
Lymphocytes Relative: 20 %
Lymphs Abs: 1.2 10*3/uL (ref 1.0–3.6)
MCH: 30.4 pg (ref 26.0–34.0)
MCHC: 33.4 g/dL (ref 32.0–36.0)
MCV: 91 fL (ref 80.0–100.0)
Monocytes Absolute: 0.7 10*3/uL (ref 0.2–1.0)
Monocytes Relative: 12 %
Neutro Abs: 4 10*3/uL (ref 1.4–6.5)
Neutrophils Relative %: 62 %
Platelets: 133 10*3/uL — ABNORMAL LOW (ref 150–440)
RBC: 4.52 MIL/uL (ref 4.40–5.90)
RDW: 13.8 % (ref 11.5–14.5)
WBC: 6.4 10*3/uL (ref 3.8–10.6)

## 2017-01-29 LAB — COMPREHENSIVE METABOLIC PANEL
ALT: 11 U/L — ABNORMAL LOW (ref 17–63)
AST: 25 U/L (ref 15–41)
Albumin: 3.9 g/dL (ref 3.5–5.0)
Alkaline Phosphatase: 93 U/L (ref 38–126)
Anion gap: 8 (ref 5–15)
BUN: 16 mg/dL (ref 6–20)
CO2: 27 mmol/L (ref 22–32)
Calcium: 9.1 mg/dL (ref 8.9–10.3)
Chloride: 105 mmol/L (ref 101–111)
Creatinine, Ser: 1.27 mg/dL — ABNORMAL HIGH (ref 0.61–1.24)
GFR calc Af Amer: >60 mL/min (ref >60)
GFR calc non Af Amer: 53 mL/min — ABNORMAL LOW (ref >60)
Glucose, Bld: 114 mg/dL — ABNORMAL HIGH (ref 65–99)
Potassium: 3.8 mmol/L (ref 3.5–5.1)
Sodium: 140 mmol/L (ref 135–145)
Total Bilirubin: 0.8 mg/dL (ref 0.3–1.2)
Total Protein: 7.3 g/dL (ref 6.5–8.1)

## 2017-01-29 MED ORDER — TETANUS-DIPHTH-ACELL PERTUSSIS 5-2.5-18.5 LF-MCG/0.5 IM SUSP
0.5000 mL | Freq: Once | INTRAMUSCULAR | Status: AC
  Administered 2017-01-30: 0.5 mL via INTRAMUSCULAR
  Filled 2017-01-29: qty 0.5

## 2017-01-29 MED ORDER — KETOROLAC TROMETHAMINE 30 MG/ML IJ SOLN
60.0000 mg | Freq: Once | INTRAMUSCULAR | Status: AC
  Administered 2017-01-30: 60 mg via INTRAMUSCULAR
  Filled 2017-01-29: qty 2

## 2017-01-29 MED ORDER — TETANUS-DIPHTHERIA TOXOIDS TD 5-2 LFU IM INJ: 0.5000 mL | INJECTION | Freq: Once | INTRAMUSCULAR | Status: DC

## 2017-01-29 MED ORDER — DOXYCYCLINE HYCLATE 100 MG PO TABS
100.0000 mg | ORAL_TABLET | Freq: Once | ORAL | Status: AC
  Administered 2017-01-29: 100 mg via ORAL
  Filled 2017-01-29: qty 1

## 2017-01-29 NOTE — ED Triage Notes (Signed)
Pt fell Oct 12th and since has c/o bilat hip and lower back pain - pt fell again Tuesday and injured right lower leg - area is red/swollen and has possible infection to center of sore

## 2017-01-29 NOTE — ED Provider Notes (Signed)
[facility] Emergency Department Provider Note   ____________________________________________   First MD Initiated Contact with Patient 01/29/17 2316     (approximate)  I have reviewed the triage vital signs and the nursing notes.   HISTORY  Chief Complaint Back Pain; Hip Pain; and Wound Check  Limited by patient is a vague historian  HPI Alex [name] is a 76 y.o. male who presents to the ED from home with a chief complaint of bilateral hip pain and right leg wound status post fall.  Patient presents reluctantly at the request of his lady friend.  Originally he suffered a mechanical fall on 10/12 and was seen in the ED for foot pain.  He tripped and fell again last week and struck his right shin.  Presents with reddened and swollen wound.  Notes continued bilateral hip pain since his original fall almost 1 month ago. Denies associated fever, chills, chest pain, shortness of breath, abdominal pain, nausea, vomiting.  Unsure of tetanus shot.   Past Medical History:  Diagnosis Date  . Alzheimer's dementia   . BPH (benign prostatic hyperplasia)   . CAD (coronary artery disease)   . Dementia   . Hypertension     Patient Active Problem List   Diagnosis Date Noted  . Chest pain 06/27/2015  . Uncontrolled hypertension 06/27/2015  . Bradycardia 06/27/2015  . Pleural thickening 06/27/2015    Past Surgical History:  Procedure Laterality Date  . CORONARY ANGIOPLASTY WITH STENT PLACEMENT    . HERNIA REPAIR      Prior to Admission medications   Medication Sig Start Date End Date Taking? Authorizing Provider  amLODipine (NORVASC) 5 MG tablet Take 5 mg by mouth daily.    [provider]  aspirin EC 81 MG tablet Take 81 mg by mouth daily.    [provider]  donepezil (ARICEPT) 10 MG tablet Take 1 tablet (10 mg total) by mouth at bedtime. Patient not taking: Reported on 08/15/2016 06/29/15   Loletha Grayer, MD  finasteride (PROSCAR) 5 MG  tablet Take 1 tablet (5 mg total) by mouth daily. 06/29/15   Loletha Grayer, MD  furosemide (LASIX) 40 MG tablet Take 40 mg by mouth daily. 08/12/16   [provider]  ibuprofen (ADVIL,MOTRIN) 200 MG tablet Take 200 mg by mouth 2 (two) times daily.    [provider]  lisinopril (PRINIVIL,ZESTRIL) 40 MG tablet Take 40 mg by mouth daily.    [provider]  metoprolol succinate (TOPROL-XL) 50 MG 24 hr tablet Take 50 mg by mouth daily. Take with or immediately following a meal.    [provider]  pravastatin (PRAVACHOL) 80 MG tablet Take 80 mg by mouth daily.    [provider]  rivastigmine (EXELON) 6 MG capsule Take 6 mg by mouth 2 (two) times daily.    [provider]    Allergies Penicillins  Family History  Problem Relation Age of Onset  . Hypertension Mother     Social History Social History  Substance Use Topics  . Smoking status: Never Smoker  . Smokeless tobacco: Never Used  . Alcohol use No    Review of Systems  Constitutional: No fever/chills. Eyes: No visual changes. ENT: No sore throat. Cardiovascular: Denies chest pain. Respiratory: Denies shortness of breath. Gastrointestinal: No abdominal pain.  No nausea, no vomiting.  No diarrhea.  No constipation. Genitourinary: Negative for dysuria. Musculoskeletal: Positive for bilateral hip pain and right leg wound.  Negative for back pain. Skin:  Negative for rash. Neurological: Negative for headaches, focal weakness or numbness.   ____________________________________________   PHYSICAL EXAM:  VITAL SIGNS: ED Triage Vitals  Enc Vitals Group     BP 01/29/17 2046 (!) 170/79     Pulse Rate 01/29/17 2046 82     Resp 01/29/17 2046 16     Temp 01/29/17 2046 98.1 F (36.7 C)     Temp Source 01/29/17 2046 Oral     SpO2 01/29/17 2042 97 %     Weight 01/29/17 2042 221 lb (100.2 kg)     Height 01/29/17 2042 6' (1.829 m)     Head Circumference --      Peak Flow --        Pain Score 01/29/17 2042 5     Pain Loc --      Pain Edu? --      Excl. in [place]? --     Constitutional: Alert and oriented. Well appearing and in no acute distress. Eyes: Conjunctivae are normal. PERRL. EOMI. Head: Atraumatic. Nose: No congestion/rhinnorhea. Mouth/Throat: Mucous membranes are moist.  Oropharynx non-erythematous. Neck: No stridor.  No cervical spine tenderness to palpation. Cardiovascular: Normal rate, regular rhythm. Grossly normal heart sounds.  Good peripheral circulation. Respiratory: Normal respiratory effort.  No retractions. Lungs CTAB. Gastrointestinal: Soft and nontender. No distention. No abdominal bruits. No CVA tenderness. Musculoskeletal: Stable pelvis.  Bilateral hips mildly tender to palpation.  Full range of motion both hips without pain.  Anterior right shin with approximately 2 cm in diameter old skin tear with surrounding mild warmth and erythema.  2+ femoral and distal pulses.  Supple calves without evidence for compartment syndrome.  Bilateral warm limbs without evidence for ischemia.  Brisk, less than 5-second capillary refill.  No joint effusions. Neurologic:  Normal speech and language. No gross focal neurologic deficits are appreciated. No gait instability. Skin:  Skin is warm, dry and intact. No rash noted. Psychiatric: Mood and affect are normal. Speech and behavior are normal.  ____________________________________________   LABS (all labs ordered are listed, but only abnormal results are displayed)  Labs Reviewed  CBC WITH DIFFERENTIAL/PLATELET - Abnormal; Notable for the following:       Result Value   Platelets 133 (*)    All other components within normal limits  COMPREHENSIVE METABOLIC PANEL - Abnormal; Notable for the following:    Glucose, Bld 114 (*)    Creatinine, Ser 1.27 (*)    ALT 11 (*)    GFR calc non Af Amer 53 (*)    All other components within normal limits    ____________________________________________  EKG  None ____________________________________________  RADIOLOGY  Dg Pelvis 1-2 Views  Result Date: 01/30/2017 CLINICAL DATA:  Fall with bilateral hip pain. EXAM: PELVIS - 1-2 VIEW COMPARISON:  None. FINDINGS: The cortical margins of the bony pelvis are intact. No fracture. Pubic symphysis and sacroiliac joints are congruent. Both femoral heads are well-seated in the respective acetabula. Joint space narrowing and acetabular spurring, left greater than right. IMPRESSION: No pelvic fracture. Electronically Signed   By: Jeb Levering M.D.   On: 01/30/2017 00:35    ____________________________________________   PROCEDURES  Procedure(s) performed: None  Procedures  Critical Care performed: No  ____________________________________________   INITIAL IMPRESSION / ASSESSMENT AND PLAN / ED COURSE  As part of my medical decision making, I reviewed the following data within the electronic MEDICAL RECORD NUMBER History obtained from family, Nursing notes reviewed and incorporated, Labs reviewed, Radiograph reviewed and Notes from  prior ED visits.   76 year old male who presents with bilateral hip pain and right shin wound infection status post mechanical fall.  Differential diagnosis includes but is not limited to contusion, fracture, dislocation, cellulitis.  Will administer NSAIDs and obtain plain film x-rays.  Will start doxycycline for wound infection.  Laboratory results noted; normal white count.  Clinical Course as of Jan 30 58  Sun Jan 30, 2017  0056 Updated patient and lady friend of negative radiographs.  Will discharge home on analgesia, antibiotic and patient to follow-up with orthopedics next week.  Strict return precautions given.  Both verbalize understanding and agree with plan of care.  [JS]    Clinical Course User Index [JS] Paulette Blanch, MD     ____________________________________________   FINAL CLINICAL  IMPRESSION(S) / ED DIAGNOSES  Final diagnoses:  Pain of both hip joints  Fall, initial encounter  Wound infection      NEW MEDICATIONS STARTED DURING THIS VISIT:  New Prescriptions   No medications on file     Note:  This document was prepared using Dragon voice recognition software and may include unintentional dictation errors.    Paulette Blanch, MD 01/30/17 [phone]

## 2017-01-30 ENCOUNTER — Emergency Department: Payer: Medicare Other

## 2017-01-30 IMAGING — DX DG PELVIS 1-2V
1 series · 1 of 1 positions shown · non-contrast
Comparison: None.

CLINICAL DATA: Fall with bilateral hip pain.

EXAM:
PELVIS - 1-2 VIEW

[pelvis ap]
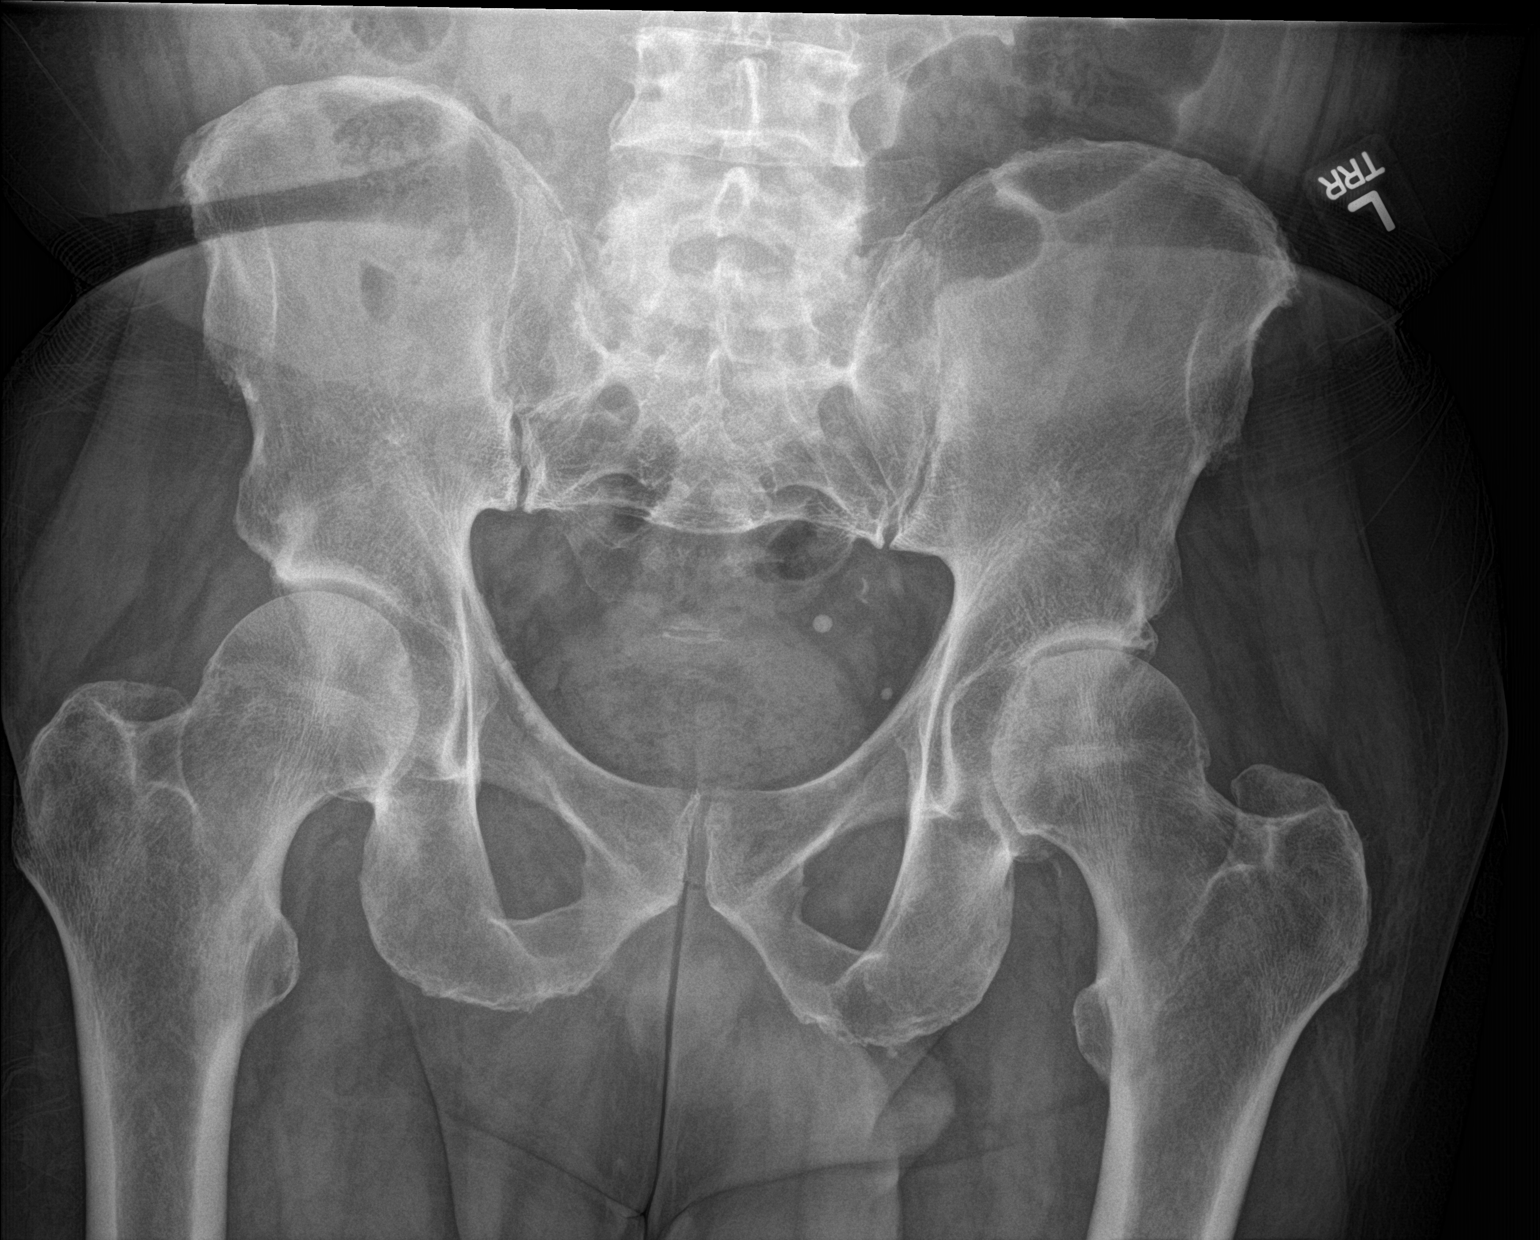

[1 of 1 positions shown; findings below may reference images not displayed]

FINDINGS: The cortical margins of the bony pelvis are intact. No fracture.
Pubic symphysis and sacroiliac joints are congruent. Both femoral
heads are well-seated in the respective acetabula. Joint space
narrowing and acetabular spurring, left greater than right.
IMPRESSION: No pelvic fracture.

## 2017-01-30 MED ORDER — DOXYCYCLINE HYCLATE 50 MG PO CAPS: 100.0000 mg | ORAL_CAPSULE | Freq: Two times a day (BID) | ORAL | 0 refills | Status: DC

## 2017-01-30 MED ORDER — HYDROCODONE-ACETAMINOPHEN 5-325 MG PO TABS: 1.0000 | ORAL_TABLET | Freq: Four times a day (QID) | ORAL | 0 refills | Status: DC | PRN

## 2017-01-30 MED ORDER — IBUPROFEN 800 MG PO TABS: 800.0000 mg | ORAL_TABLET | Freq: Three times a day (TID) | ORAL | 0 refills | Status: DC | PRN

## 2017-01-30 NOTE — Discharge Instructions (Signed)
1.  Take antibiotic as prescribed (doxycycline). 2.  You may take pain medicines as needed (Motrin/Norco). 3.  Return to the ER for worsening symptoms, persistent vomiting, difficulty breathing or other concerns.

## 2017-04-27 ENCOUNTER — Other Ambulatory Visit: Payer: Self-pay

## 2017-04-27 DIAGNOSIS — I2510 Atherosclerotic heart disease of native coronary artery without angina pectoris: Secondary | ICD-10-CM | POA: Diagnosis not present

## 2017-04-27 DIAGNOSIS — I10 Essential (primary) hypertension: Secondary | ICD-10-CM | POA: Diagnosis present

## 2017-04-27 DIAGNOSIS — Z79899 Other long term (current) drug therapy: Secondary | ICD-10-CM | POA: Diagnosis not present

## 2017-04-27 DIAGNOSIS — F0280 Dementia in other diseases classified elsewhere without behavioral disturbance: Secondary | ICD-10-CM | POA: Diagnosis not present

## 2017-04-27 DIAGNOSIS — G308 Other Alzheimer's disease: Secondary | ICD-10-CM | POA: Insufficient documentation

## 2017-04-27 DIAGNOSIS — Z955 Presence of coronary angioplasty implant and graft: Secondary | ICD-10-CM | POA: Insufficient documentation

## 2017-04-27 DIAGNOSIS — Z7982 Long term (current) use of aspirin: Secondary | ICD-10-CM | POA: Insufficient documentation

## 2017-04-27 LAB — COMPREHENSIVE METABOLIC PANEL
ALT: 13 U/L — ABNORMAL LOW (ref 17–63)
AST: 24 U/L (ref 15–41)
Albumin: 4.2 g/dL (ref 3.5–5.0)
Alkaline Phosphatase: 67 U/L (ref 38–126)
Anion gap: 9 (ref 5–15)
BUN: 17 mg/dL (ref 6–20)
CO2: 28 mmol/L (ref 22–32)
Calcium: 9 mg/dL (ref 8.9–10.3)
Chloride: 105 mmol/L (ref 101–111)
Creatinine, Ser: 1.23 mg/dL (ref 0.61–1.24)
GFR calc Af Amer: >60 mL/min (ref >60)
GFR calc non Af Amer: 55 mL/min — ABNORMAL LOW (ref >60)
Glucose, Bld: 105 mg/dL — ABNORMAL HIGH (ref 65–99)
Potassium: 4.1 mmol/L (ref 3.5–5.1)
Sodium: 142 mmol/L (ref 135–145)
Total Bilirubin: 0.6 mg/dL (ref 0.3–1.2)
Total Protein: 7.4 g/dL (ref 6.5–8.1)

## 2017-04-27 LAB — CBC WITH DIFFERENTIAL/PLATELET
Basophils Absolute: 0.1 10*3/uL (ref 0–0.1)
Basophils Relative: 1 %
Eosinophils Absolute: 0.1 10*3/uL (ref 0–0.7)
Eosinophils Relative: 1 %
HCT: 44.3 % (ref 40.0–52.0)
Hemoglobin: 14.6 g/dL (ref 13.0–18.0)
Lymphocytes Relative: 15 %
Lymphs Abs: 1.2 10*3/uL (ref 1.0–3.6)
MCH: 30.2 pg (ref 26.0–34.0)
MCHC: 33.1 g/dL (ref 32.0–36.0)
MCV: 91.3 fL (ref 80.0–100.0)
Monocytes Absolute: 0.8 10*3/uL (ref 0.2–1.0)
Monocytes Relative: 11 %
Neutro Abs: 5.4 10*3/uL (ref 1.4–6.5)
Neutrophils Relative %: 72 %
Platelets: 110 10*3/uL — ABNORMAL LOW (ref 150–440)
RBC: 4.85 MIL/uL (ref 4.40–5.90)
RDW: 13.9 % (ref 11.5–14.5)
WBC: 7.5 10*3/uL (ref 3.8–10.6)

## 2017-04-27 LAB — TROPONIN I: Troponin I: <0.03 ng/mL (ref <0.03)

## 2017-04-27 NOTE — ED Triage Notes (Addendum)
Pt reports hypertension - reading 240/124 - pt took all medications as ordered today - denies shortness of breath and denies chest pain - denies headache and denies dizziness

## 2017-04-28 ENCOUNTER — Emergency Department: Payer: Medicare HMO

## 2017-04-28 ENCOUNTER — Emergency Department
Admission: EM | Admit: 2017-04-28 | Discharge: 2017-04-28 | Disposition: A | Discharge status: Home / Self Care | Payer: Medicare HMO | Attending: Emergency Medicine | Admitting: Emergency Medicine

## 2017-04-28 DIAGNOSIS — I10 Essential (primary) hypertension: Secondary | ICD-10-CM

## 2017-04-28 LAB — URINALYSIS, COMPLETE (UACMP) WITH MICROSCOPIC
Bacteria, UA: NONE SEEN
Bilirubin Urine: NEGATIVE
Glucose, UA: NEGATIVE mg/dL
Hgb urine dipstick: NEGATIVE
Ketones, ur: NEGATIVE mg/dL
Leukocytes, UA: NEGATIVE
Nitrite: NEGATIVE
Protein, ur: NEGATIVE mg/dL
Specific Gravity, Urine: 1.009 (ref 1.005–1.030)
Squamous Epithelial / LPF: NONE SEEN
pH: 6 (ref 5.0–8.0)

## 2017-04-28 LAB — URINE DRUG SCREEN, QUALITATIVE (ARMC ONLY)
Amphetamines, Ur Screen: NOT DETECTED
Barbiturates, Ur Screen: NOT DETECTED
Benzodiazepine, Ur Scrn: NOT DETECTED
Cannabinoid 50 Ng, Ur ~~LOC~~: NOT DETECTED
Cocaine Metabolite,Ur ~~LOC~~: NOT DETECTED
MDMA (Ecstasy)Ur Screen: NOT DETECTED
Methadone Scn, Ur: NOT DETECTED
Opiate, Ur Screen: NOT DETECTED
Phencyclidine (PCP) Ur S: NOT DETECTED
Tricyclic, Ur Screen: NOT DETECTED

## 2017-04-28 IMAGING — CR DG CHEST 2V
1 series · 2 of 2 positions shown · non-contrast
Comparison: [DATE]

CLINICAL DATA: High blood pressure. Previous history of
hypertension.

EXAM:
CHEST  2 VIEW

[Series 1: dg chest 2 view · 0.14mm/px · 2 of 2 slices shown]
[im 1/2]
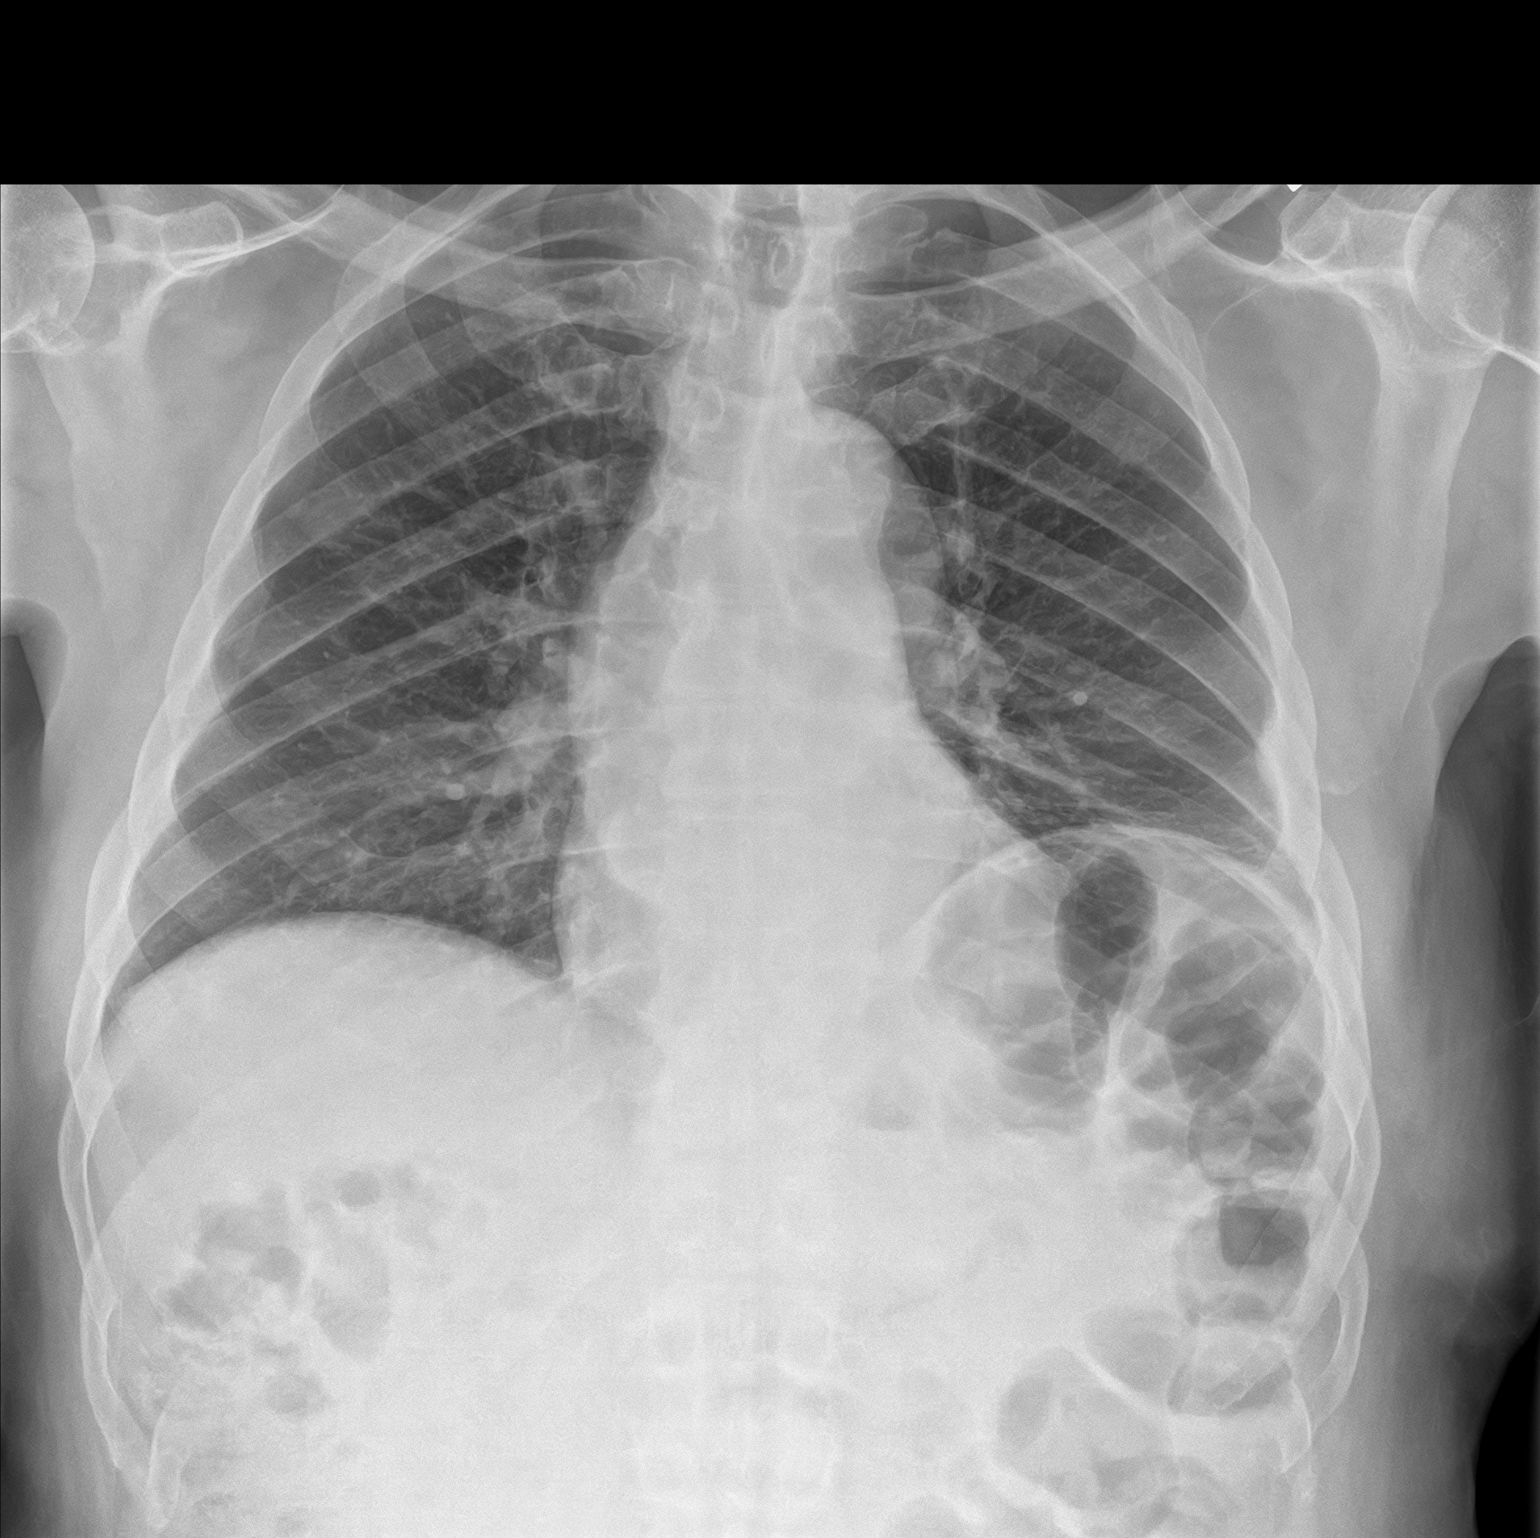
[im 2/2]
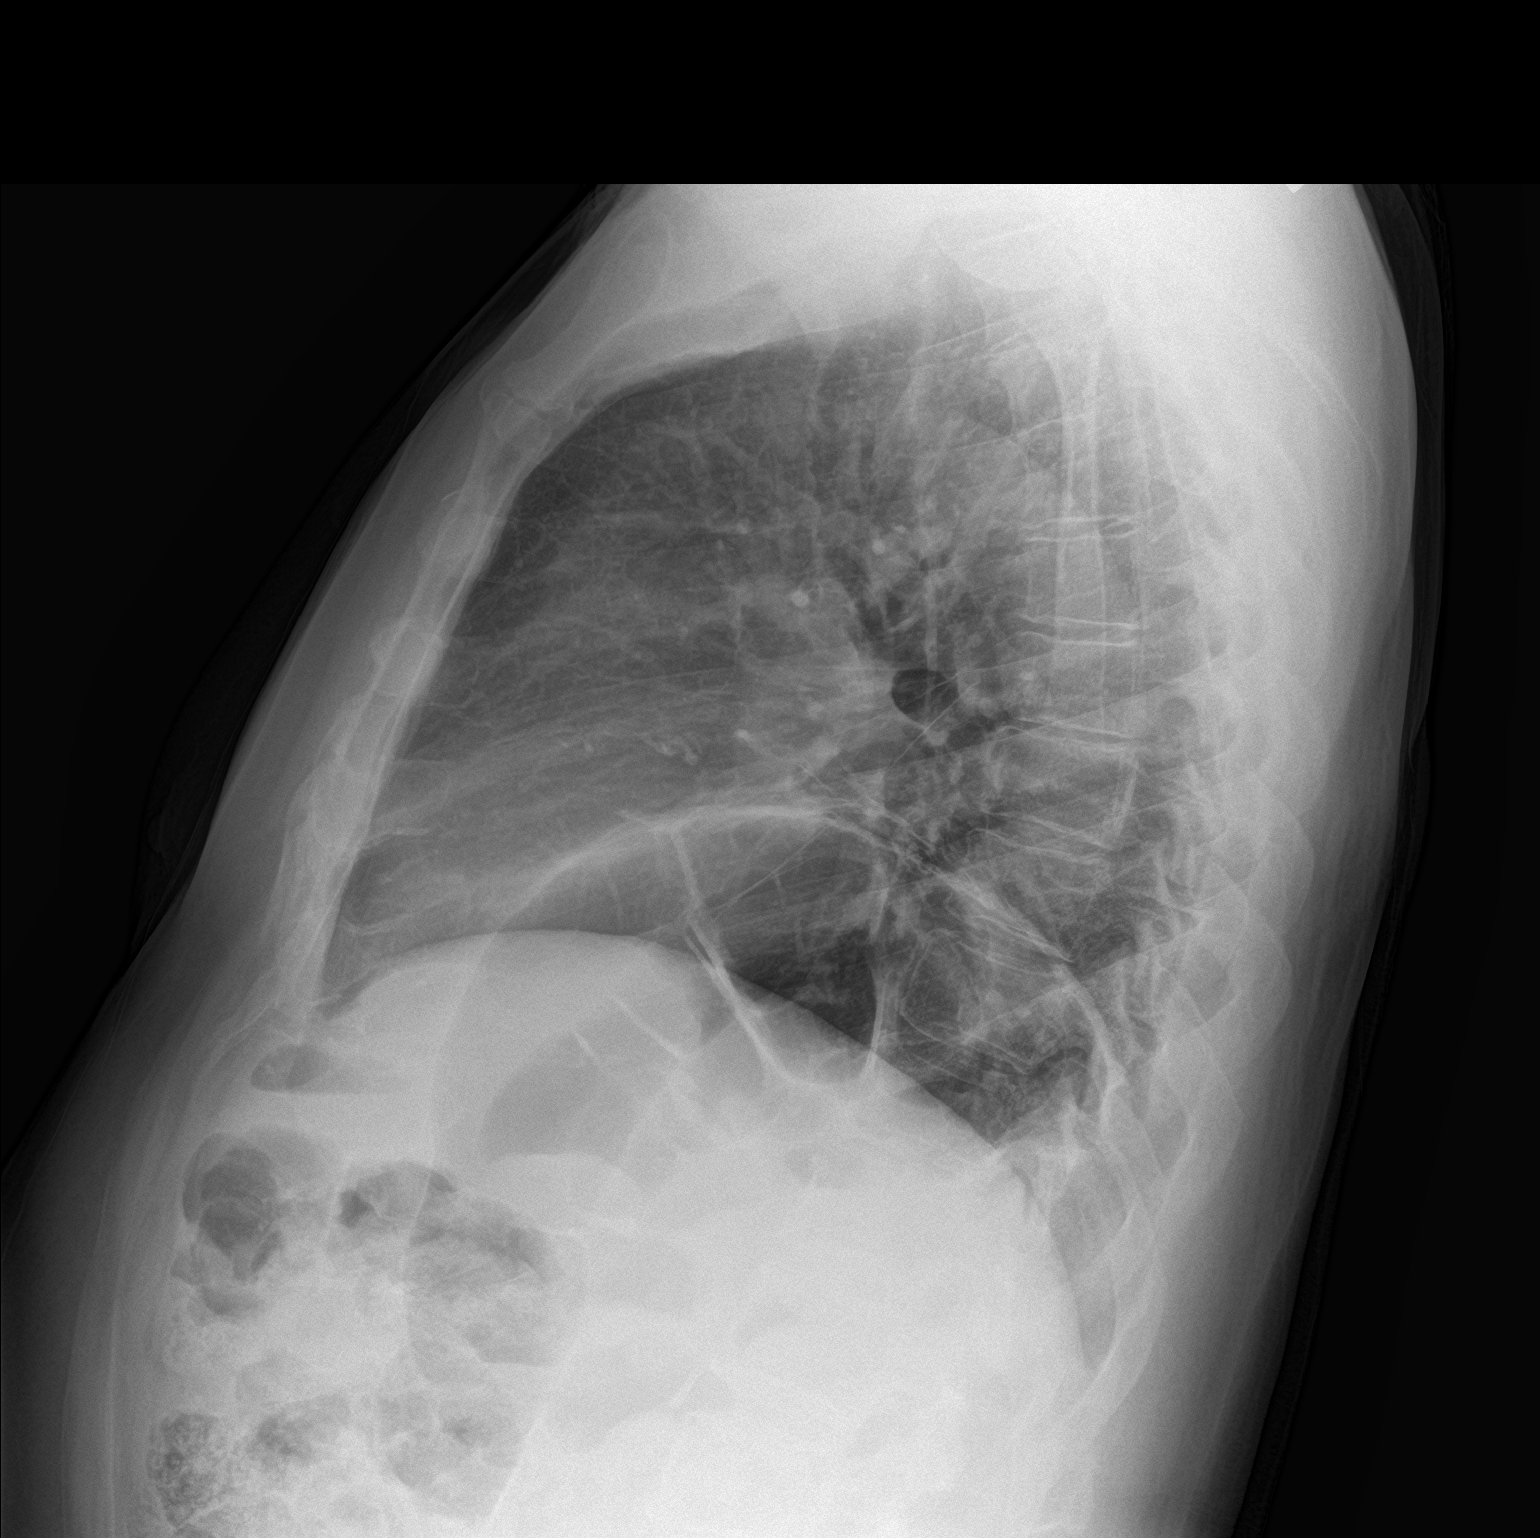

[2 of 2 positions shown; findings below may reference images not displayed]

FINDINGS: Shallow inspiration with linear atelectasis in the lung bases. No
airspace disease or consolidation in the lungs. No blunting of
costophrenic angles. No pneumothorax. Mediastinal contours appear
intact. Heart size and pulmonary vascularity are normal.
IMPRESSION: Shallow inspiration with linear atelectasis in the lung bases. No
evidence of active pulmonary disease.

## 2017-04-28 MED ORDER — CLONIDINE HCL 0.1 MG PO TABS: 0.1000 mg | ORAL_TABLET | Freq: Two times a day (BID) | ORAL | 0 refills | Status: DC | PRN

## 2017-04-28 MED ORDER — CLONIDINE HCL 0.1 MG PO TABS
0.1000 mg | ORAL_TABLET | Freq: Once | ORAL | Status: AC
  Administered 2017-04-28: 0.1 mg via ORAL
  Filled 2017-04-28: qty 1

## 2017-04-28 NOTE — ED Notes (Signed)
PT left ED with family members and was ambulatory without assistance. Family to drive pt home at this time.

## 2017-04-28 NOTE — Discharge Instructions (Signed)
1. Continue all blood pressure medicines daily as directed by your doctor. 2. Continue to keep a log of your blood pressures morning, noon and evening. 3. You may take Clonidine 0.1mg  up to twice daily as needed for: top BP >200 or bottom BP > 100. 4. Return to the ER for worsening symptoms, persistent vomiting, difficulty breathing or other concerns.

## 2017-04-28 NOTE — ED Provider Notes (Signed)
[facility] Emergency Department Provider Note   ____________________________________________   First MD Initiated Contact with Patient 04/28/17 0013     (approximate)  I have reviewed the triage vital signs and the nursing notes.   HISTORY  Chief Complaint Hypertension    HPI Alex [name] is a 77 y.o. male who presents to the ED from home with a chief complaint of elevated blood pressure.  Patient has a history of hypertension; takes amlodipine 5mg  qd, Lasix 40mg  qd, Lisinopril 40mg  qd, Toprol XL 50mg  qd.  Saw a new PCP at the [place] this week and was started on Memantine.  Since starting the new medicine increased blood pressures; tonight BP 240/124.  Patient denies symptoms of headache, vision changes, neck pain, chest pain, shortness of breath, abdominal pain, nausea, vomiting, dysuria or diarrhea. Denies recent travel or trauma.   Past Medical History:  Diagnosis Date  . Alzheimer's dementia   . BPH (benign prostatic hyperplasia)   . CAD (coronary artery disease)   . Dementia   . Hypertension     Patient Active Problem List   Diagnosis Date Noted  . Chest pain 06/27/2015  . Uncontrolled hypertension 06/27/2015  . Bradycardia 06/27/2015  . Pleural thickening 06/27/2015    Past Surgical History:  Procedure Laterality Date  . CORONARY ANGIOPLASTY WITH STENT PLACEMENT    . HERNIA REPAIR      Prior to Admission medications   Medication Sig Start Date End Date Taking? Authorizing Provider  amLODipine (NORVASC) 5 MG tablet Take 5 mg by mouth daily.    [provider]  aspirin EC 81 MG tablet Take 81 mg by mouth daily.    [provider]  cloNIDine (CATAPRES) 0.1 MG tablet Take 1 tablet (0.1 mg total) by mouth 2 (two) times daily as needed. 04/28/17   Paulette Blanch, MD  donepezil (ARICEPT) 10 MG tablet Take 1 tablet (10 mg total) by mouth at bedtime. Patient not taking: Reported on 08/15/2016 06/29/15   Loletha Grayer, MD    doxycycline (VIBRAMYCIN) 50 MG capsule Take 2 capsules (100 mg total) by mouth 2 (two) times daily. 01/30/17   Paulette Blanch, MD  finasteride (PROSCAR) 5 MG tablet Take 1 tablet (5 mg total) by mouth daily. 06/29/15   Loletha Grayer, MD  furosemide (LASIX) 40 MG tablet Take 40 mg by mouth daily. 08/12/16   [provider]  HYDROcodone-acetaminophen (NORCO) 5-325 MG tablet Take 1 tablet by mouth every 6 (six) hours as needed for moderate pain. 01/30/17   Paulette Blanch, MD  ibuprofen (ADVIL,MOTRIN) 800 MG tablet Take 1 tablet (800 mg total) by mouth every 8 (eight) hours as needed for moderate pain. 01/30/17   Paulette Blanch, MD  lisinopril (PRINIVIL,ZESTRIL) 40 MG tablet Take 40 mg by mouth daily.    [provider]  metoprolol succinate (TOPROL-XL) 50 MG 24 hr tablet Take 50 mg by mouth daily. Take with or immediately following a meal.    [provider]  pravastatin (PRAVACHOL) 80 MG tablet Take 80 mg by mouth daily.    [provider]  rivastigmine (EXELON) 6 MG capsule Take 6 mg by mouth 2 (two) times daily.    [provider]    Allergies Penicillins  Family History  Problem Relation Age of Onset  . Hypertension Mother     Social History Social History   Tobacco Use  . Smoking status: Never Smoker  . Smokeless tobacco: Never Used  Substance Use  Topics  . Alcohol use: No  . Drug use: No    Review of Systems  Constitutional: No fever/chills. Eyes: No visual changes. ENT: No sore throat. Cardiovascular: Denies chest pain. Respiratory: Denies shortness of breath. Gastrointestinal: No abdominal pain.  No nausea, no vomiting.  No diarrhea.  No constipation. Genitourinary: Negative for dysuria. Musculoskeletal: Negative for back pain. Skin: Negative for rash. Neurological: Negative for headaches, focal weakness or numbness.   ____________________________________________   PHYSICAL EXAM:  VITAL SIGNS: ED Triage Vitals  Enc Vitals  Group     BP 04/27/17 2028 (!) 224/109     Pulse Rate 04/27/17 2028 (!) 52     Resp 04/27/17 2028 15     Temp --      Temp Source 04/27/17 2028 Oral     SpO2 04/27/17 2028 98 %     Weight 04/27/17 2027 220 lb (99.8 kg)     Height 04/27/17 2027 6\' 2"  (1.88 m)     Head Circumference --      Peak Flow --      Pain Score 04/27/17 2026 0     Pain Loc --      Pain Edu? --      Excl. in [place]? --     Constitutional: Alert and oriented. Well appearing and in no acute distress. Eyes: Conjunctivae are normal. PERRL. EOMI. Head: Atraumatic. Nose: No congestion/rhinnorhea. Mouth/Throat: Mucous membranes are moist.  Oropharynx non-erythematous. Neck: No stridor.  No carotid bruits. Cardiovascular: Normal rate, regular rhythm. Grossly normal heart sounds.  Good peripheral circulation. Respiratory: Normal respiratory effort.  No retractions. Lungs CTAB. Gastrointestinal: Soft and nontender. No distention. No abdominal bruits. No CVA tenderness. Musculoskeletal: No lower extremity tenderness nor edema.  No joint effusions. Neurologic:  Alert and oriented to person and place which is normal for patient. Normal speech and language. No gross focal neurologic deficits are appreciated. MAEx4. Skin:  Skin is warm, dry and intact. No rash noted. Psychiatric: Mood and affect are normal. Speech and behavior are normal.  ____________________________________________   LABS (all labs ordered are listed, but only abnormal results are displayed)  Labs Reviewed  CBC WITH DIFFERENTIAL/PLATELET - Abnormal; Notable for the following components:      Result Value   Platelets 110 (*)    All other components within normal limits  COMPREHENSIVE METABOLIC PANEL - Abnormal; Notable for the following components:   Glucose, Bld 105 (*)    ALT 13 (*)    GFR calc non Af Amer 55 (*)    All other components within normal limits  URINALYSIS, COMPLETE (UACMP) WITH MICROSCOPIC - Abnormal; Notable for the following  components:   Color, Urine STRAW (*)    APPearance CLEAR (*)    All other components within normal limits  TROPONIN I  URINE DRUG SCREEN, QUALITATIVE ([facility] ONLY)   ____________________________________________  EKG  ED ECG REPORT I, Shy Guallpa J, the attending physician, personally viewed and interpreted this ECG.   Date: 04/28/2017  EKG Time: 2030  Rate: 51  Rhythm: sinus bradycardia  Axis: Normal  Intervals:none  ST&T Change: Nonspecific  ____________________________________________  RADIOLOGY  Chest 2 view: viewed by me, interpreted per Dr. Gerilyn Nestle:  Shallow inspiration with linear atelectasis in the lung bases. No  evidence of active pulmonary disease.    ____________________________________________   PROCEDURES  Procedure(s) performed: None  Procedures  Critical Care performed: No  ____________________________________________   INITIAL IMPRESSION / ASSESSMENT AND PLAN / ED COURSE  As part of my  medical decision making, I reviewed the following data within the Gerty History obtained from family, Nursing notes reviewed and incorporated, Labs reviewed, EKG interpreted, Old chart reviewed, Radiograph reviewed and Notes from prior ED visits.   77 year old male with hypertension, dementia who presents to the ED for elevated blood pressure. Recently started Memantine this week.  This includes but is not limited to cardiac, infectious, neurologic, metabolic etiologies, etc. Laboratory results including troponin and EKG are unremarkable.  Will obtain chest x-ray and urinalysis.  Discussed with wife and daughter risks/benefits of treating asymptomatic hypertension. It does appear patient is elevated beyond his baseline; will administer clonidine and reassess.   Clinical Course as of Apr 28 208  Thu Apr 28, 2017  0203 Patient resting in NAD. BP 188/97. Agitated at BP cuff and eager for discharge. Updated patient and family of imaging and  urinalysis results. Will prescribed clonidine PRN and patient is to follow up with his PCP closely. Strict return precautions given. Patient, daughter and spouse all verbalize understanding and agree with plan of care.  [JS]    Clinical Course User Index [JS] Paulette Blanch, MD     ____________________________________________   FINAL CLINICAL IMPRESSION(S) / ED DIAGNOSES  Final diagnoses:  Essential hypertension     ED Discharge Orders        Ordered    cloNIDine (CATAPRES) 0.1 MG tablet  2 times daily PRN     04/28/17 0206       Note:  This document was prepared using Dragon voice recognition software and may include unintentional dictation errors.    Paulette Blanch, MD 04/28/17 [phone]

## 2017-04-28 NOTE — ED Notes (Signed)
MD talking to pt and family and then radiology at bedside to transported to X-ray.

## 2017-11-05 ENCOUNTER — Other Ambulatory Visit: Payer: Self-pay

## 2017-11-05 ENCOUNTER — Emergency Department: Payer: Medicare HMO

## 2017-11-05 ENCOUNTER — Emergency Department
Admission: EM | Admit: 2017-11-05 | Discharge: 2017-11-05 | Disposition: A | Discharge status: Other Acute Inpatient Hospital | Payer: Medicare HMO | Attending: Emergency Medicine | Admitting: Emergency Medicine

## 2017-11-05 ENCOUNTER — Encounter: Payer: Self-pay | Admitting: Emergency Medicine

## 2017-11-05 DIAGNOSIS — Z79899 Other long term (current) drug therapy: Secondary | ICD-10-CM | POA: Insufficient documentation

## 2017-11-05 DIAGNOSIS — R609 Edema, unspecified: Secondary | ICD-10-CM

## 2017-11-05 DIAGNOSIS — G309 Alzheimer's disease, unspecified: Secondary | ICD-10-CM | POA: Insufficient documentation

## 2017-11-05 DIAGNOSIS — R600 Localized edema: Secondary | ICD-10-CM | POA: Insufficient documentation

## 2017-11-05 DIAGNOSIS — Z7982 Long term (current) use of aspirin: Secondary | ICD-10-CM | POA: Diagnosis not present

## 2017-11-05 DIAGNOSIS — F0280 Dementia in other diseases classified elsewhere without behavioral disturbance: Secondary | ICD-10-CM | POA: Diagnosis not present

## 2017-11-05 DIAGNOSIS — I10 Essential (primary) hypertension: Secondary | ICD-10-CM | POA: Diagnosis not present

## 2017-11-05 DIAGNOSIS — N289 Disorder of kidney and ureter, unspecified: Secondary | ICD-10-CM | POA: Insufficient documentation

## 2017-11-05 DIAGNOSIS — R001 Bradycardia, unspecified: Secondary | ICD-10-CM | POA: Diagnosis not present

## 2017-11-05 DIAGNOSIS — I2510 Atherosclerotic heart disease of native coronary artery without angina pectoris: Secondary | ICD-10-CM | POA: Insufficient documentation

## 2017-11-05 DIAGNOSIS — R2243 Localized swelling, mass and lump, lower limb, bilateral: Secondary | ICD-10-CM | POA: Diagnosis present

## 2017-11-05 LAB — CBC
HCT: 36.2 % — ABNORMAL LOW (ref 40.0–52.0)
Hemoglobin: 12.5 g/dL — ABNORMAL LOW (ref 13.0–18.0)
MCH: 31.1 pg (ref 26.0–34.0)
MCHC: 34.6 g/dL (ref 32.0–36.0)
MCV: 89.8 fL (ref 80.0–100.0)
Platelets: 111 10*3/uL — ABNORMAL LOW (ref 150–440)
RBC: 4.03 MIL/uL — ABNORMAL LOW (ref 4.40–5.90)
RDW: 13.4 % (ref 11.5–14.5)
WBC: 5.9 10*3/uL (ref 3.8–10.6)

## 2017-11-05 LAB — BASIC METABOLIC PANEL
Anion gap: 9 (ref 5–15)
BUN: 20 mg/dL (ref 8–23)
CO2: 27 mmol/L (ref 22–32)
Calcium: 8.7 mg/dL — ABNORMAL LOW (ref 8.9–10.3)
Chloride: 106 mmol/L (ref 98–111)
Creatinine, Ser: 1.75 mg/dL — ABNORMAL HIGH (ref 0.61–1.24)
GFR calc Af Amer: 42 mL/min — ABNORMAL LOW (ref >60)
GFR calc non Af Amer: 36 mL/min — ABNORMAL LOW (ref >60)
Glucose, Bld: 103 mg/dL — ABNORMAL HIGH (ref 70–99)
Potassium: 3.8 mmol/L (ref 3.5–5.1)
Sodium: 142 mmol/L (ref 135–145)

## 2017-11-05 LAB — URINALYSIS, COMPLETE (UACMP) WITH MICROSCOPIC
Bacteria, UA: NONE SEEN
Bilirubin Urine: NEGATIVE
Glucose, UA: NEGATIVE mg/dL
Ketones, ur: NEGATIVE mg/dL
Leukocytes, UA: NEGATIVE
Nitrite: NEGATIVE
Protein, ur: NEGATIVE mg/dL
Specific Gravity, Urine: 1.018 (ref 1.005–1.030)
Squamous Epithelial / LPF: NONE SEEN (ref 0–5)
pH: 5 (ref 5.0–8.0)

## 2017-11-05 LAB — BRAIN NATRIURETIC PEPTIDE: B Natriuretic Peptide: 72 pg/mL (ref 0.0–100.0)

## 2017-11-05 LAB — TROPONIN I: Troponin I: <0.03 ng/mL (ref <0.03)

## 2017-11-05 IMAGING — CR DG CHEST 2V
2 series · 2 of 2 positions shown · non-contrast
Comparison: [DATE]

CLINICAL DATA: bilateral leg swelling x 3 weeks that is worse
today. Patient's wife states patient was complaining to numbness to
his feet this morning and swelling was increased. She also reports
patient recently having low blood pressure and low heart rate.
medication does not seem to be helping.

EXAM:
CHEST - 2 VIEW

[chest lat]
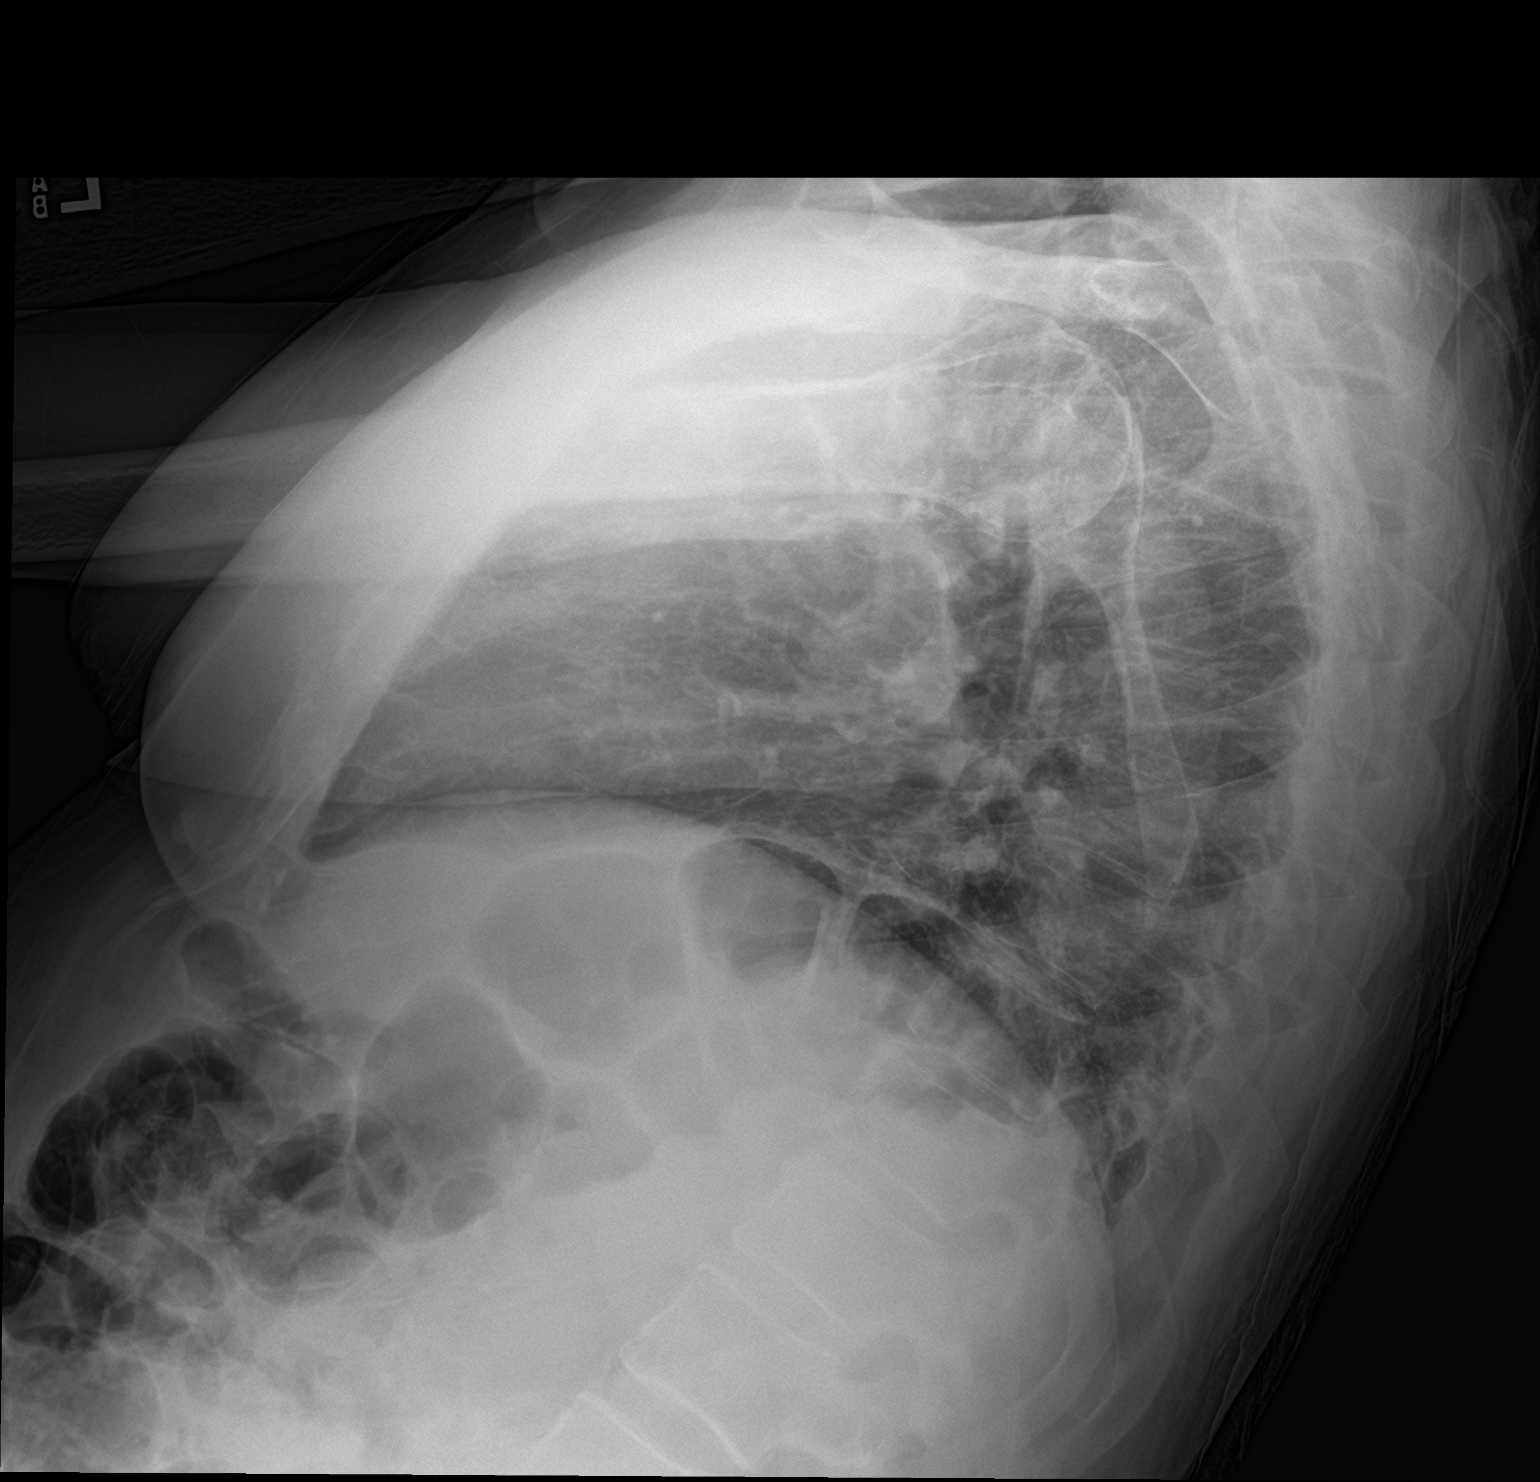

[chest ap]
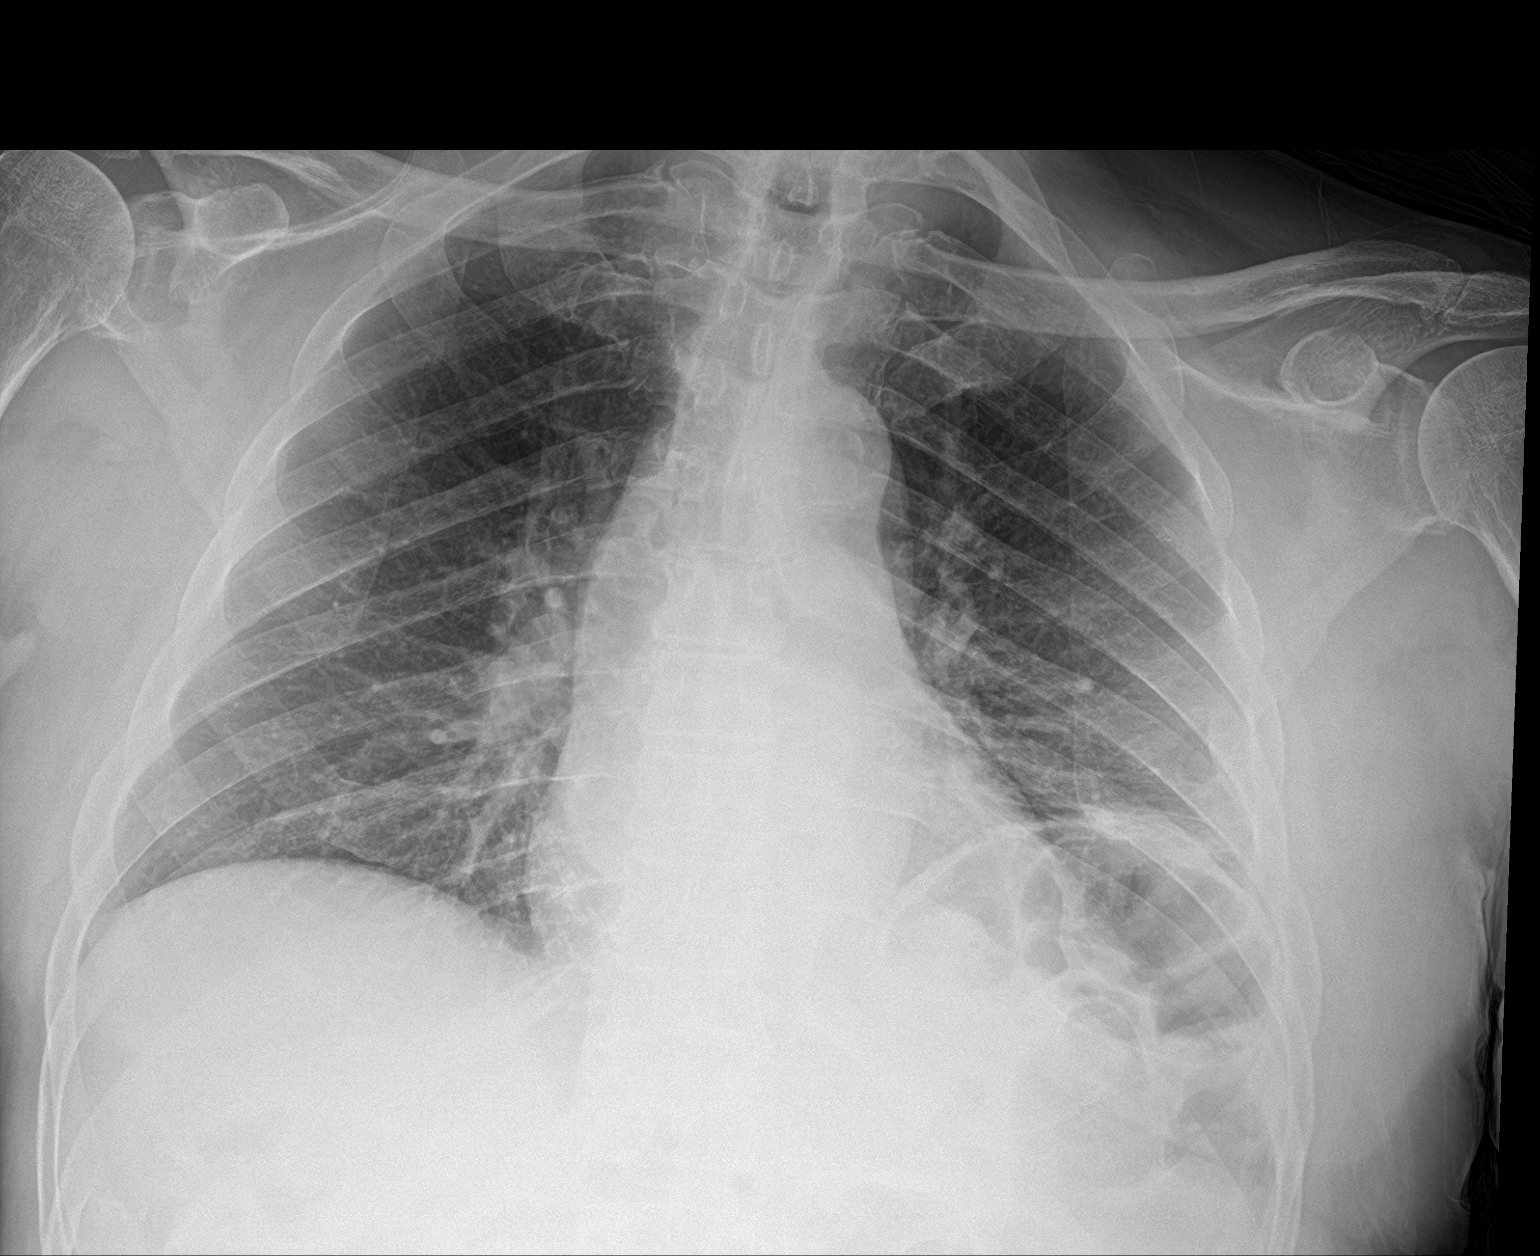

[2 of 2 positions shown; findings below may reference images not displayed]

FINDINGS: Linear scarring or subsegmental atelectasis at the left lung base as
before. Mild elevation of the left diaphragmatic leaflet stable.
Right lung clear.

Heart size normal.  Aortic Atherosclerosis ([[place]]-170.0).

No effusion.  No pneumothorax.

Visualized bones unremarkable.
IMPRESSION: No acute cardiopulmonary disease.

## 2017-11-05 NOTE — ED Triage Notes (Signed)
Patient presents to the ED with bilateral leg swelling x 3 weeks that is worse today.  Patient's wife states patient was complaining to numbness to his feet this morning and swelling was increased.  She also reports patient recently having low blood pressure and low heart rate.  Patient occasionally takes furosemide for swelling but wife states medication does not seem to be helping.  Patient has alzheimer's and is pleasantly confused.

## 2017-11-05 NOTE — ED Notes (Signed)
Pt assisted to side of bed and provided urinal.

## 2017-11-05 NOTE — ED Notes (Signed)
[facility] arrived to transport pt. Verbal report given to Angela Burke, EMT-P.

## 2017-11-05 NOTE — ED Provider Notes (Signed)
[facility] Emergency Department Provider Note  Time seen: 1:34 PM  I have reviewed the triage vital signs and the nursing notes.   HISTORY  Chief Complaint Leg Swelling    HPI Alex [name] is a 77 y.o. male with a past medical history of Alzheimer's dementia, hypertension, presents to the emergency department for concern over lower extremity swelling and a low heart rate.  According to the wife for the past 2 to 3 weeks the patient has had progressively worsening lower extremity swelling.  She states previously he would occasionally get a very slight amount of swelling in his feet he is prescribed Lasix 40 mg just to be used if needed.  Has not been requiring it until the past 2 to 3 weeks.  For the past nearly 2 weeks patient has been taking on a daily basis but continues to have worsening swelling.  Wife states today as to where she is ever seen his legs.  She also states she tried to take his blood pressure and it was in the 70s this morning with a heart rate around 40.  She became concerned so she brought the patient to the emergency department for evaluation.  Patient does have dementia, he is calm and cooperative has no complaints.  Does have significant lower extremity swelling.  Past Medical History:  Diagnosis Date  . Alzheimer's dementia   . BPH (benign prostatic hyperplasia)   . CAD (coronary artery disease)   . Dementia   . Hypertension     Patient Active Problem List   Diagnosis Date Noted  . Chest pain 06/27/2015  . Uncontrolled hypertension 06/27/2015  . Bradycardia 06/27/2015  . Pleural thickening 06/27/2015    Past Surgical History:  Procedure Laterality Date  . CORONARY ANGIOPLASTY WITH STENT PLACEMENT    . HERNIA REPAIR      Prior to Admission medications   Medication Sig Start Date End Date Taking? Authorizing Provider  amLODipine (NORVASC) 5 MG tablet Take 5 mg by mouth daily.    [provider]  aspirin EC 81 MG tablet  Take 81 mg by mouth daily.    [provider]  cloNIDine (CATAPRES) 0.1 MG tablet Take 1 tablet (0.1 mg total) by mouth 2 (two) times daily as needed. 04/28/17   Paulette Blanch, MD  donepezil (ARICEPT) 10 MG tablet Take 1 tablet (10 mg total) by mouth at bedtime. Patient not taking: Reported on 08/15/2016 06/29/15   Loletha Grayer, MD  doxycycline (VIBRAMYCIN) 50 MG capsule Take 2 capsules (100 mg total) by mouth 2 (two) times daily. 01/30/17   Paulette Blanch, MD  finasteride (PROSCAR) 5 MG tablet Take 1 tablet (5 mg total) by mouth daily. 06/29/15   Loletha Grayer, MD  furosemide (LASIX) 40 MG tablet Take 40 mg by mouth daily. 08/12/16   [provider]  HYDROcodone-acetaminophen (NORCO) 5-325 MG tablet Take 1 tablet by mouth every 6 (six) hours as needed for moderate pain. 01/30/17   Paulette Blanch, MD  ibuprofen (ADVIL,MOTRIN) 800 MG tablet Take 1 tablet (800 mg total) by mouth every 8 (eight) hours as needed for moderate pain. 01/30/17   Paulette Blanch, MD  lisinopril (PRINIVIL,ZESTRIL) 40 MG tablet Take 40 mg by mouth daily.    [provider]  metoprolol succinate (TOPROL-XL) 50 MG 24 hr tablet Take 50 mg by mouth daily. Take with or immediately following a meal.    [provider]  pravastatin (PRAVACHOL) 80 MG tablet Take  80 mg by mouth daily.    [provider]  rivastigmine (EXELON) 6 MG capsule Take 6 mg by mouth 2 (two) times daily.    [provider]    Allergies  Allergen Reactions  . Penicillins Other (See Comments)    Unknown reaction, childhood  Has patient had a PCN reaction causing immediate rash, facial/tongue/throat swelling, SOB or lightheadedness with hypotension: Yes Has patient had a PCN reaction causing severe rash involving mucus membranes or skin necrosis: No Has patient had a PCN reaction that required hospitalization: No Has patient had a PCN reaction occurring within the last 10 years: No If all of the above answers are  "NO", then may proceed with Cephalosporin use.     Family History  Problem Relation Age of Onset  . Hypertension Mother     Social History Social History   Tobacco Use  . Smoking status: Never Smoker  . Smokeless tobacco: Never Used  Substance Use Topics  . Alcohol use: No  . Drug use: No    Review of Systems Unable to complete an adequate/accurate review of systems secondary to baseline dementia.  ____________________________________________   PHYSICAL EXAM:  VITAL SIGNS: ED Triage Vitals  Enc Vitals Group     BP 11/05/17 1042 133/66     Pulse Rate 11/05/17 1042 (!) 48     Resp 11/05/17 1042 20     Temp 11/05/17 1042 97.6 F (36.4 C)     Temp Source 11/05/17 1042 Oral     SpO2 11/05/17 1042 99 %     Weight 11/05/17 1043 210 lb (95.3 kg)     Height 11/05/17 1043 5\' 11"  (1.803 m)     Head Circumference --      Peak Flow --      Pain Score 11/05/17 1043 0     Pain Loc --      Pain Edu? --      Excl. in [place]? --    Constitutional: Alert and oriented. Well appearing and in no distress. Eyes: Normal exam ENT   Head: Normocephalic and atraumatic.   Mouth/Throat: Mucous membranes are moist. Cardiovascular: Regular rhythm, rate around 40-50 bpm Respiratory: Normal respiratory effort without tachypnea nor retractions. Breath sounds are clear Gastrointestinal: Soft and nontender. No distention. Musculoskeletal: Nontender with normal range of motion in all extremities. No lower extremity tenderness, 3+ pitting edema bilaterally.. Neurologic:  Normal speech and language. No gross focal neurologic deficits  Skin:  Skin is warm, dry and intact.  Psychiatric: Mood and affect are normal  ____________________________________________    EKG  EKG reviewed and interpreted by myself shows sinus bradycardia at 50 bpm with a narrow QRS, normal axis, normal intervals, no ST changes.  ____________________________________________    RADIOLOGY  X-ray  negative  ____________________________________________   INITIAL IMPRESSION / ASSESSMENT AND PLAN / ED COURSE  Pertinent labs & imaging results that were available during my care of the patient were reviewed by me and considered in my medical decision making (see chart for details).  Patient presents to the emergency department with concerns of increased lower extremity edema, bradycardia and a low blood pressure today.  Upon arrival patient's blood pressure is normal but his heart rate continues to dip into the low 40s currently 44 bpm during my evaluation.  Differential would include worsening peripheral edema, worsening ejection fraction/CHF, symptomatic bradycardia, possible hypotension earlier today, ACS, renal failure.  Patient's labs do show acute renal insufficiency GFR is usually greater than 60 it  is currently 32.  Patient has 3+ pitting edema to lower extremities which is abnormal.  Patient has been using daily Lasix for almost 2 weeks without improvement.  Continues to worsen.  Now with reported hypotension this morning in the 70s at home per wife with a heart rate of 40 currently 44 bpm.  Patient's labs otherwise are at baseline including a negative troponin.  We will obtain a chest x-ray, BNP reassuringly is 72.  However given the patient's acute worsening and peripheral edema with bradycardia currently in the low 40s and reported hypotension this morning in addition to acute renal insufficiency I believe the patient would benefit from admission to the hospital.  Patient has [employer] benefits and would like to be transferred to the [place] per wife.  We will discuss with the [facility] for possible transfer.  ----------------------------------------- 3:51 PM on 11/05/2017 -----------------------------------------  Patient has been accepted to the [facility] by Dr. Joaquim Lai.  ____________________________________________   FINAL CLINICAL IMPRESSION(S) / ED DIAGNOSES  Bradycardia Peripheral edema Acute renal  insufficiency    Harvest Dark, MD 11/05/17 1551

## 2017-11-05 NOTE — ED Notes (Signed)
emtala reviewed by this RN 

## 2017-11-05 NOTE — ED Triage Notes (Signed)
Patient's wife states patient has been tired and weak lately.  "he's not doing his chores."

## 2017-11-21 ENCOUNTER — Emergency Department: Payer: Medicare HMO

## 2017-11-21 ENCOUNTER — Inpatient Hospital Stay
Admission: EM | Admit: 2017-11-21 | Discharge: 2017-11-28 | DRG: 381 | Disposition: A | Discharge status: Home / Self Care | Payer: Medicare HMO | Attending: Surgery | Admitting: Surgery

## 2017-11-21 ENCOUNTER — Encounter: Payer: Self-pay | Admitting: *Deleted

## 2017-11-21 ENCOUNTER — Inpatient Hospital Stay
Admit: 2017-11-21 | Discharge: 2017-11-21 | Disposition: A | Discharge status: Home / Self Care | Payer: Medicare HMO | Attending: Internal Medicine | Admitting: Internal Medicine

## 2017-11-21 ENCOUNTER — Other Ambulatory Visit: Payer: Self-pay

## 2017-11-21 ENCOUNTER — Inpatient Hospital Stay: Payer: Medicare HMO

## 2017-11-21 DIAGNOSIS — E785 Hyperlipidemia, unspecified: Secondary | ICD-10-CM | POA: Diagnosis present

## 2017-11-21 DIAGNOSIS — Z8679 Personal history of other diseases of the circulatory system: Secondary | ICD-10-CM

## 2017-11-21 DIAGNOSIS — K3189 Other diseases of stomach and duodenum: Secondary | ICD-10-CM | POA: Diagnosis present

## 2017-11-21 DIAGNOSIS — N183 Chronic kidney disease, stage 3 (moderate): Secondary | ICD-10-CM | POA: Diagnosis present

## 2017-11-21 DIAGNOSIS — Z955 Presence of coronary angioplasty implant and graft: Secondary | ICD-10-CM | POA: Diagnosis not present

## 2017-11-21 DIAGNOSIS — G309 Alzheimer's disease, unspecified: Secondary | ICD-10-CM | POA: Diagnosis present

## 2017-11-21 DIAGNOSIS — Z880 Allergy status to penicillin: Secondary | ICD-10-CM

## 2017-11-21 DIAGNOSIS — I509 Heart failure, unspecified: Secondary | ICD-10-CM | POA: Diagnosis present

## 2017-11-21 DIAGNOSIS — I2510 Atherosclerotic heart disease of native coronary artery without angina pectoris: Secondary | ICD-10-CM | POA: Diagnosis present

## 2017-11-21 DIAGNOSIS — K255 Chronic or unspecified gastric ulcer with perforation: Secondary | ICD-10-CM | POA: Diagnosis present

## 2017-11-21 DIAGNOSIS — K55059 Acute (reversible) ischemia of intestine, part and extent unspecified: Secondary | ICD-10-CM

## 2017-11-21 DIAGNOSIS — K6389 Other specified diseases of intestine: Secondary | ICD-10-CM

## 2017-11-21 DIAGNOSIS — I130 Hypertensive heart and chronic kidney disease with heart failure and stage 1 through stage 4 chronic kidney disease, or unspecified chronic kidney disease: Secondary | ICD-10-CM | POA: Diagnosis present

## 2017-11-21 DIAGNOSIS — Z79899 Other long term (current) drug therapy: Secondary | ICD-10-CM | POA: Diagnosis not present

## 2017-11-21 DIAGNOSIS — N400 Enlarged prostate without lower urinary tract symptoms: Secondary | ICD-10-CM | POA: Diagnosis present

## 2017-11-21 DIAGNOSIS — R1013 Epigastric pain: Secondary | ICD-10-CM | POA: Diagnosis present

## 2017-11-21 DIAGNOSIS — Z8249 Family history of ischemic heart disease and other diseases of the circulatory system: Secondary | ICD-10-CM

## 2017-11-21 DIAGNOSIS — R319 Hematuria, unspecified: Secondary | ICD-10-CM | POA: Diagnosis not present

## 2017-11-21 DIAGNOSIS — F0280 Dementia in other diseases classified elsewhere without behavioral disturbance: Secondary | ICD-10-CM | POA: Diagnosis present

## 2017-11-21 DIAGNOSIS — R32 Unspecified urinary incontinence: Secondary | ICD-10-CM | POA: Diagnosis present

## 2017-11-21 DIAGNOSIS — N179 Acute kidney failure, unspecified: Secondary | ICD-10-CM | POA: Diagnosis present

## 2017-11-21 DIAGNOSIS — Z7982 Long term (current) use of aspirin: Secondary | ICD-10-CM

## 2017-11-21 LAB — CBC
HCT: 41.9 % (ref 40.0–52.0)
Hemoglobin: 14.3 g/dL (ref 13.0–18.0)
MCH: 30.9 pg (ref 26.0–34.0)
MCHC: 34.1 g/dL (ref 32.0–36.0)
MCV: 90.6 fL (ref 80.0–100.0)
Platelets: 109 10*3/uL — ABNORMAL LOW (ref 150–440)
RBC: 4.63 MIL/uL (ref 4.40–5.90)
RDW: 13.9 % (ref 11.5–14.5)
WBC: 14.5 10*3/uL — ABNORMAL HIGH (ref 3.8–10.6)

## 2017-11-21 LAB — TROPONIN I
Troponin I: <0.03 ng/mL (ref <0.03)
Troponin I: <0.03 ng/mL (ref <0.03)

## 2017-11-21 LAB — BASIC METABOLIC PANEL
Anion gap: 8 (ref 5–15)
BUN: 11 mg/dL (ref 8–23)
CO2: 22 mmol/L (ref 22–32)
Calcium: 6.6 mg/dL — ABNORMAL LOW (ref 8.9–10.3)
Chloride: 113 mmol/L — ABNORMAL HIGH (ref 98–111)
Creatinine, Ser: 1.07 mg/dL (ref 0.61–1.24)
GFR calc Af Amer: >60 mL/min (ref >60)
GFR calc non Af Amer: >60 mL/min (ref >60)
Glucose, Bld: 90 mg/dL (ref 70–99)
Potassium: 3.6 mmol/L (ref 3.5–5.1)
Sodium: 143 mmol/L (ref 135–145)

## 2017-11-21 IMAGING — CT CT ABD-PELV W/O CM
1 series · 1 of 1 positions shown · non-contrast
Comparison: [DATE] CT angiogram of the chest.

CLINICAL DATA: 76 y/o  M; nausea, vomiting, abdominal pain.

EXAM:
CT CHEST, ABDOMEN AND PELVIS WITHOUT CONTRAST
TECHNIQUE: Multidetector CT imaging of the chest, abdomen and pelvis was
performed following the standard protocol without IV contrast.

[Series 1: topogram 0.6 t20f · coronal · 2.00mm/px · 1 of 1 slices shown]
[im 1/1]
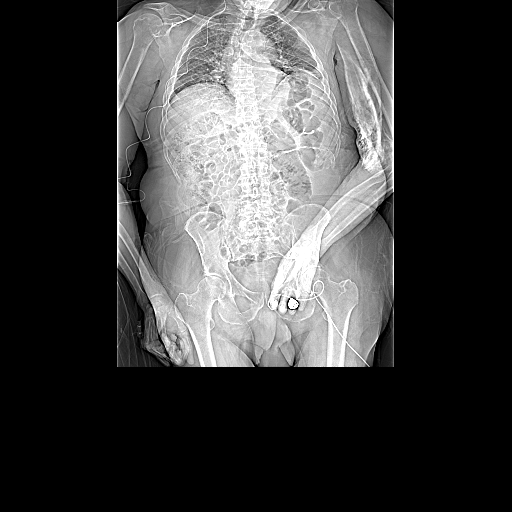

[1 of 1 positions shown; findings below may reference images not displayed]

FINDINGS: CT CHEST FINDINGS

Cardiovascular: Stable 4.4 cm ascending aortic aneurysm. Mild aortic
and severe coronary artery calcific atherosclerosis. Normal heart
size. No pericardial effusion.

Mediastinum/Nodes: No enlarged mediastinal, hilar, or axillary lymph
nodes. Thyroid gland, trachea, and esophagus demonstrate no
significant findings.

Lungs/Pleura: Peribronchial thickening and scattered mucous plugging
within the lower lobes. No consolidation. No pleural effusion. No
pneumothorax.

Musculoskeletal: No chest wall mass or suspicious bone lesions
identified.

CT ABDOMEN PELVIS FINDINGS

Hepatobiliary: Branching air within the liver which appears to be
predominantly peripheral, suspicious for portal venous gas. No
gallstones, gallbladder wall thickening, or biliary dilatation.

Pancreas: Unremarkable. No pancreatic ductal dilatation or
surrounding inflammatory changes.

Spleen: Normal in size without focal abnormality.

Adrenals/Urinary Tract: Adrenal glands are unremarkable. Kidneys are
normal, without renal calculi, focal lesion, or hydronephrosis.
Bladder is unremarkable. Contrast is present within the renal
collecting system.

Stomach/Bowel: Diffuse wall thickening of the stomach and anti
dependent pneumatosis in the posterior wall of the stomach body
(series 2 image [id number]). Appendix not identified, no pericecal
inflammation. No evidence of bowel wall thickening, distention, or
inflammatory changes of the small or large bowel.

Vascular/Lymphatic: Aortic atherosclerosis. No enlarged abdominal or
pelvic lymph nodes.

Reproductive: Prostatic calcifications. Mild prostate enlargement
extending into the floor of bladder.

Other: No abdominal wall hernia or abnormality. No abdominopelvic
ascites.

Musculoskeletal: Multilevel degenerative changes of the spine
greatest at L5-S1 with there is severe loss of intervertebral disc
space height. No acute osseous abnormality is evident.
IMPRESSION: 1. Liver portal venous gas and anti dependent pneumatosis within the
posterior wall of the gastric body. Suspected gastric infarction.
2. Stable 4.4 cm ascending aortic aneurysm. Recommend annual imaging
followup by CTA or MRA. This recommendation follows [DA]
ACCF/AHA/AATS/ACR/ASA/SCA/YAMILEX/YAMILEX/YAMILEX/YAMILEX Guidelines for the
Diagnosis and Management of Patients with Thoracic Aortic Disease.
Circulation. [DA]; 121: [id number].
3. Coronary and aortic calcific atherosclerosis.
4. Peribronchial thickening and mucous plugging in the lung bases
which may represent bronchitis or possibly aspiration given
distribution.

These results were called by telephone at the time of interpretation
on [DATE] at [DATE] to Dr. YAMILEX , who verbally
acknowledged these results.

By: YAMILEX M.D.

## 2017-11-21 IMAGING — CT CT ABD-PELV W/O CM
2 of 4 series · 14 of 46 positions shown, 16 images · non-contrast
Comparison: [DATE] CT angiogram of the chest.

CLINICAL DATA: 76 y/o  M; nausea, vomiting, abdominal pain.

EXAM:
CT CHEST, ABDOMEN AND PELVIS WITHOUT CONTRAST
TECHNIQUE: Multidetector CT imaging of the chest, abdomen and pelvis was
performed following the standard protocol without IV contrast.

[Series 2: routine abd/pel wo · axial · 0.79mm/px · z∈[-542,-92]mm · 11 of 108 slices shown, 13 images]
[im 9/108  soft-tissue]
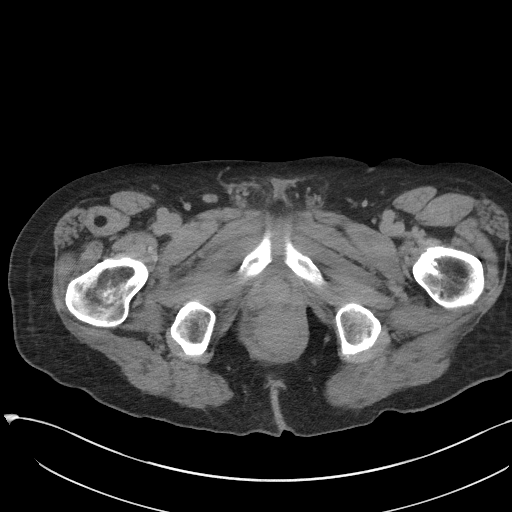
[im 9/108  bone]
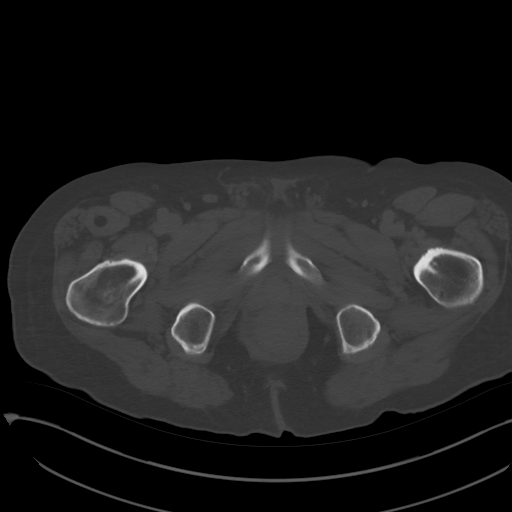
[im 18/108  soft-tissue]
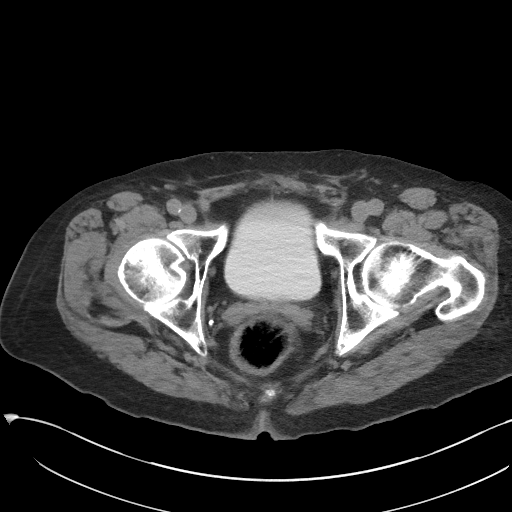
[im 26/108  soft-tissue]
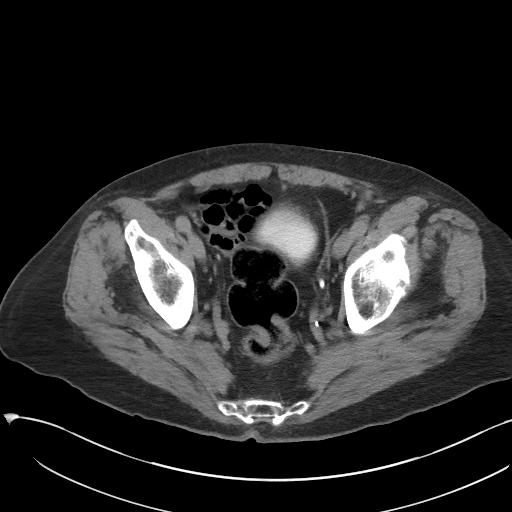
[im 35/108  soft-tissue]
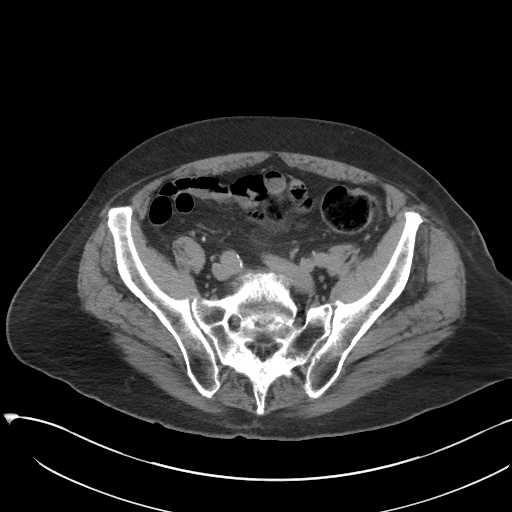
[im 43/108  soft-tissue]
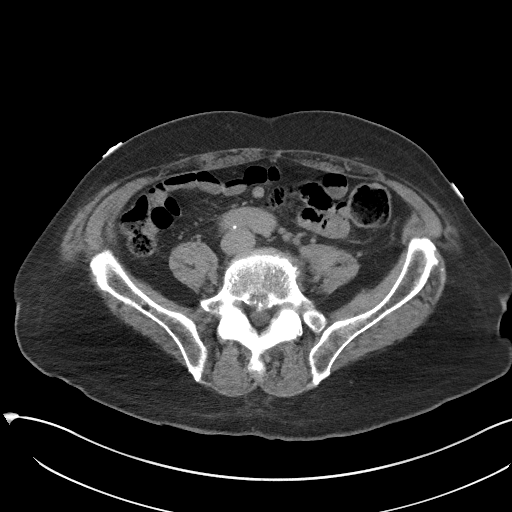
[im 56/108  soft-tissue]
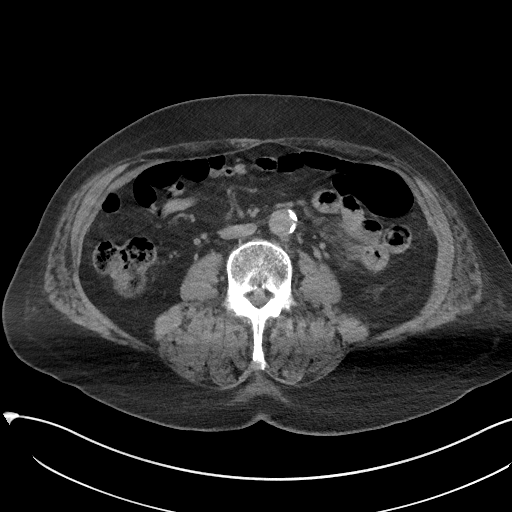
[im 65/108  soft-tissue]
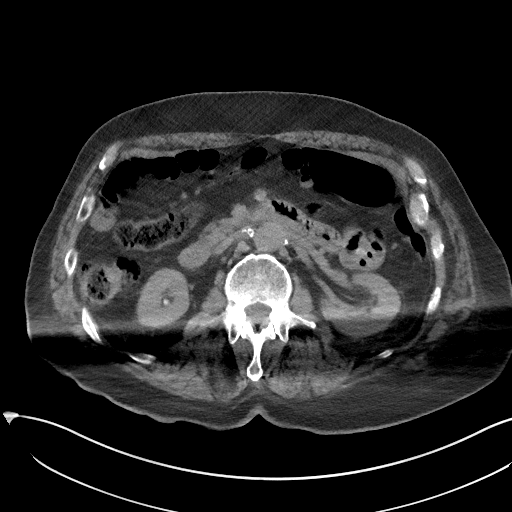
[im 73/108  soft-tissue]
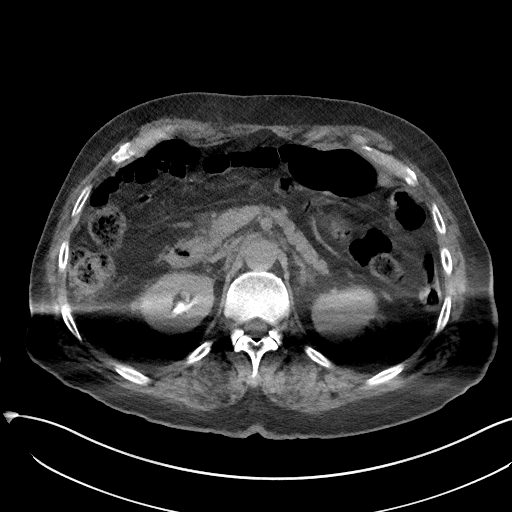
[im 82/108  soft-tissue]
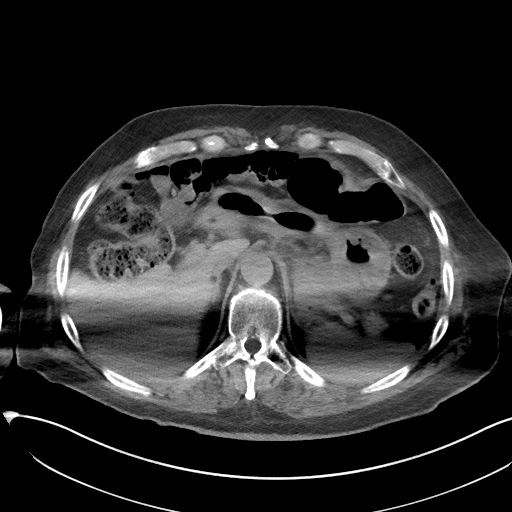
[im 82/108  bone]
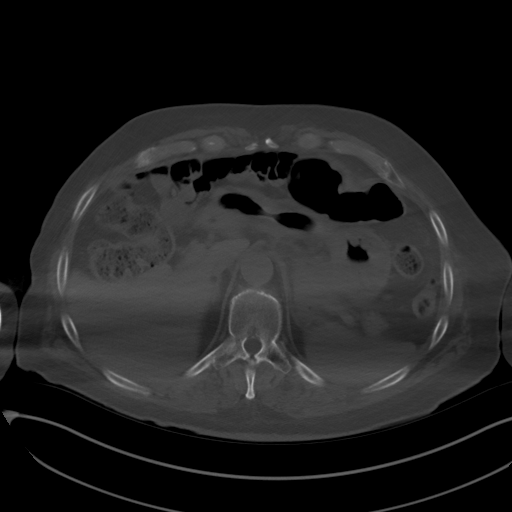
[im 90/108  soft-tissue]
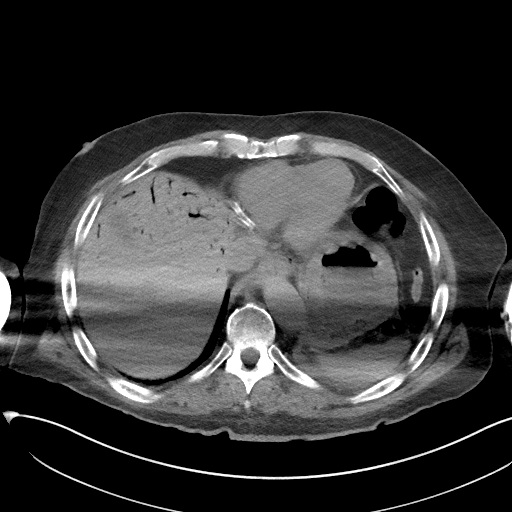
[im 99/108  soft-tissue]
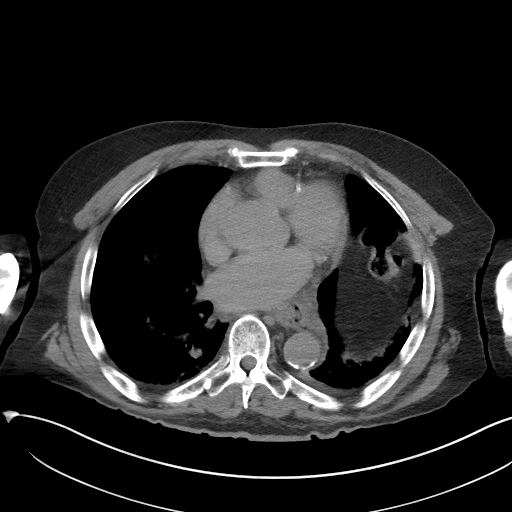

[Series 5: coronal st · coronal · 0.75mm/px · 3 of 86 slices shown]
[im 29/86  soft-tissue]
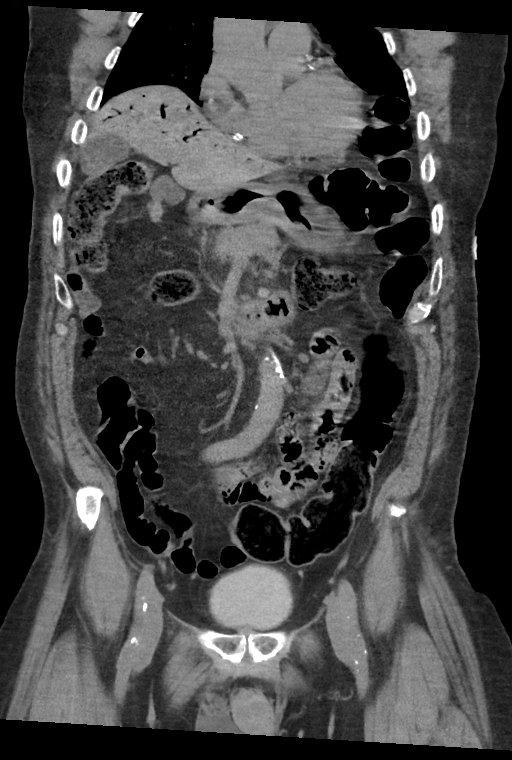
[im 38/86  soft-tissue]
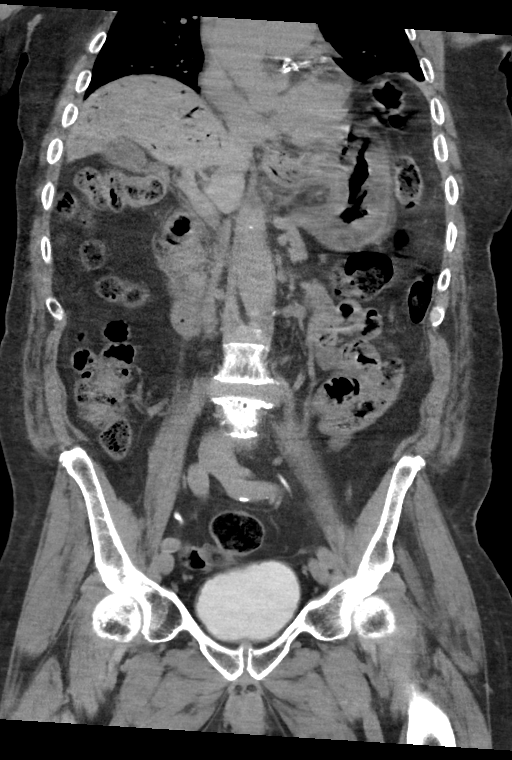
[im 48/86  soft-tissue]
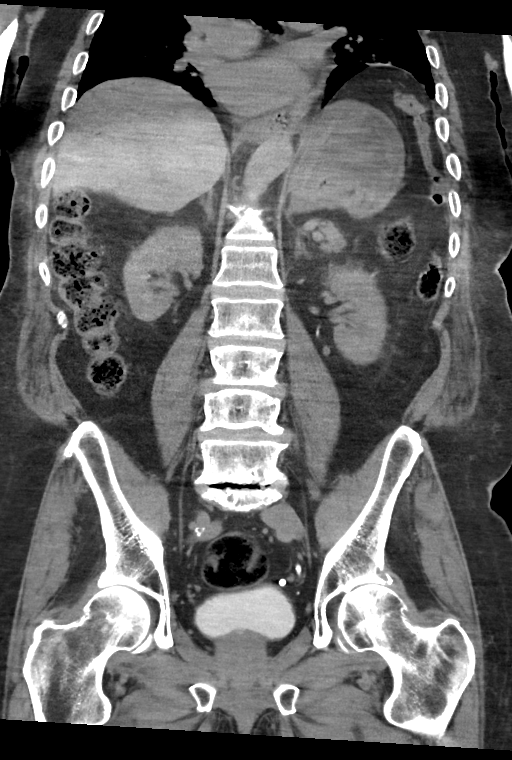

[14 of 46 positions shown; findings below may reference images not displayed]

FINDINGS: CT CHEST FINDINGS

Cardiovascular: Stable 4.4 cm ascending aortic aneurysm. Mild aortic
and severe coronary artery calcific atherosclerosis. Normal heart
size. No pericardial effusion.

Mediastinum/Nodes: No enlarged mediastinal, hilar, or axillary lymph
nodes. Thyroid gland, trachea, and esophagus demonstrate no
significant findings.

Lungs/Pleura: Peribronchial thickening and scattered mucous plugging
within the lower lobes. No consolidation. No pleural effusion. No
pneumothorax.

Musculoskeletal: No chest wall mass or suspicious bone lesions
identified.

CT ABDOMEN PELVIS FINDINGS

Hepatobiliary: Branching air within the liver which appears to be
predominantly peripheral, suspicious for portal venous gas. No
gallstones, gallbladder wall thickening, or biliary dilatation.

Pancreas: Unremarkable. No pancreatic ductal dilatation or
surrounding inflammatory changes.

Spleen: Normal in size without focal abnormality.

Adrenals/Urinary Tract: Adrenal glands are unremarkable. Kidneys are
normal, without renal calculi, focal lesion, or hydronephrosis.
Bladder is unremarkable. Contrast is present within the renal
collecting system.

Stomach/Bowel: Diffuse wall thickening of the stomach and anti
dependent pneumatosis in the posterior wall of the stomach body
(series 2 image 18-25). Appendix not identified, no pericecal
inflammation. No evidence of bowel wall thickening, distention, or
inflammatory changes of the small or large bowel.

Vascular/Lymphatic: Aortic atherosclerosis. No enlarged abdominal or
pelvic lymph nodes.

Reproductive: Prostatic calcifications. Mild prostate enlargement
extending into the floor of bladder.

Other: No abdominal wall hernia or abnormality. No abdominopelvic
ascites.

Musculoskeletal: Multilevel degenerative changes of the spine
greatest at L5-S1 with there is severe loss of intervertebral disc
space height. No acute osseous abnormality is evident.
IMPRESSION: 1. Liver portal venous gas and anti dependent pneumatosis within the
posterior wall of the gastric body. Suspected gastric infarction.
2. Stable 4.4 cm ascending aortic aneurysm. Recommend annual imaging
followup by CTA or MRA. This recommendation follows [DA]
ACCF/AHA/AATS/ACR/ASA/SCA/YAMILEX/YAMILEX/YAMILEX/YAMILEX Guidelines for the
Diagnosis and Management of Patients with Thoracic Aortic Disease.
Circulation. [DA]; 121: E266-e369.
3. Coronary and aortic calcific atherosclerosis.
4. Peribronchial thickening and mucous plugging in the lung bases
which may represent bronchitis or possibly aspiration given
distribution.

These results were called by telephone at the time of interpretation
on [DATE] at [DATE] to Dr. YAMILEX , who verbally
acknowledged these results.

By: YAMILEX M.D.

## 2017-11-21 IMAGING — DX DG CHEST 1V PORT
1 series · 1 of 1 positions shown · non-contrast
Comparison: [DATE]

CLINICAL DATA: Chest and abdominal pain

EXAM:
PORTABLE CHEST 1 VIEW

[chest ap]
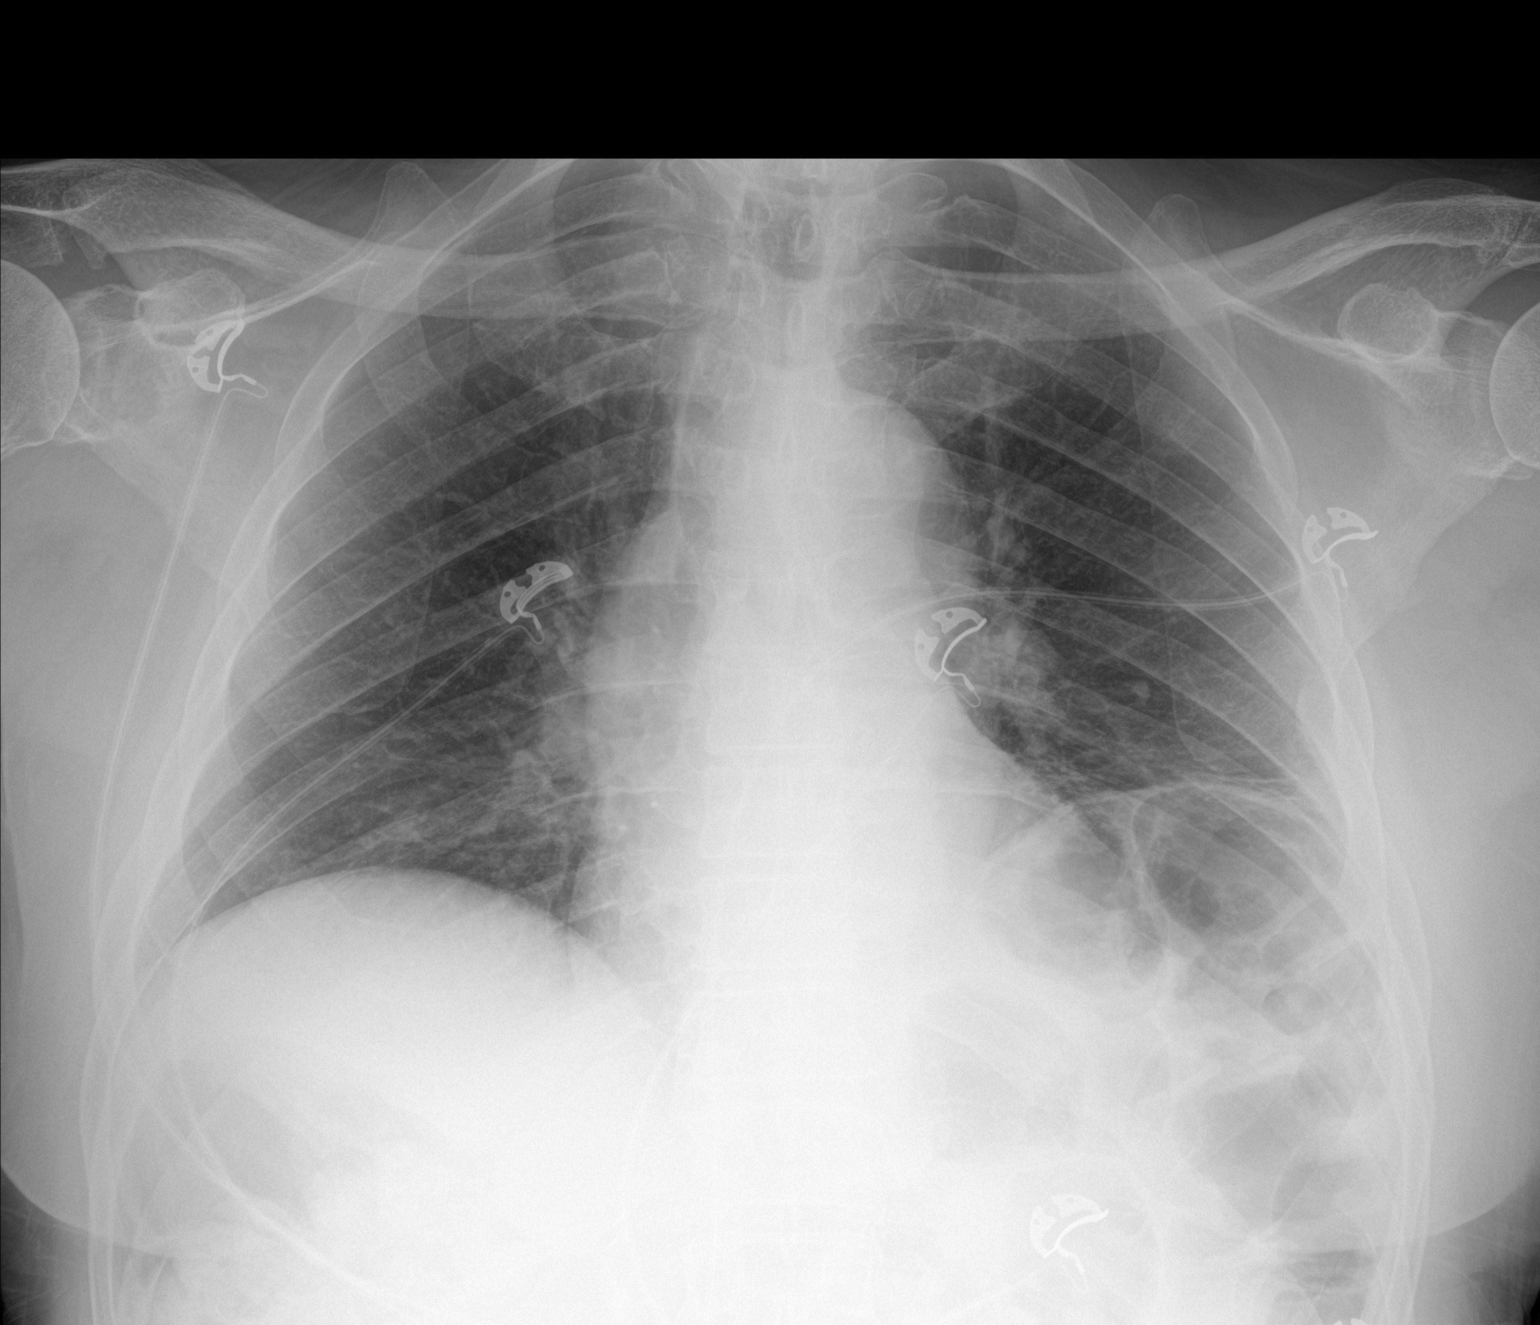

[1 of 1 positions shown; findings below may reference images not displayed]

FINDINGS: Bibasilar scarring. Heart is normal size. No acute confluent
airspace opacity or effusion. No acute bony abnormality.
IMPRESSION: Bibasilar scarring.  No active disease.

## 2017-11-21 IMAGING — RF DG NASO G TUBE PLC W/FL W/RAD
1 series · 1 of 1 positions shown · IV contrast (agent unspecified)
Comparison: Noncontrast abdominopelvic CT scan of today's date.

CLINICAL DATA: Fluoroscopically assisted placement of a nasogastric
tube.

EXAM:
NASO G TUBE PLACEMENT WITH FL AND WITH RAD
CONTRAST:  No contrast was administered.
FLUOROSCOPY TIME:  Fluoroscopy Time:  1 minutes
Radiation Exposure Index (if provided by the fluoroscopic device):
11.5 mGy
Number of Acquired Spot Images: 1 screen save

[Series 1: cp_standard · 0.26mm/px · 1 of 1 slices shown]
[im 1/1]
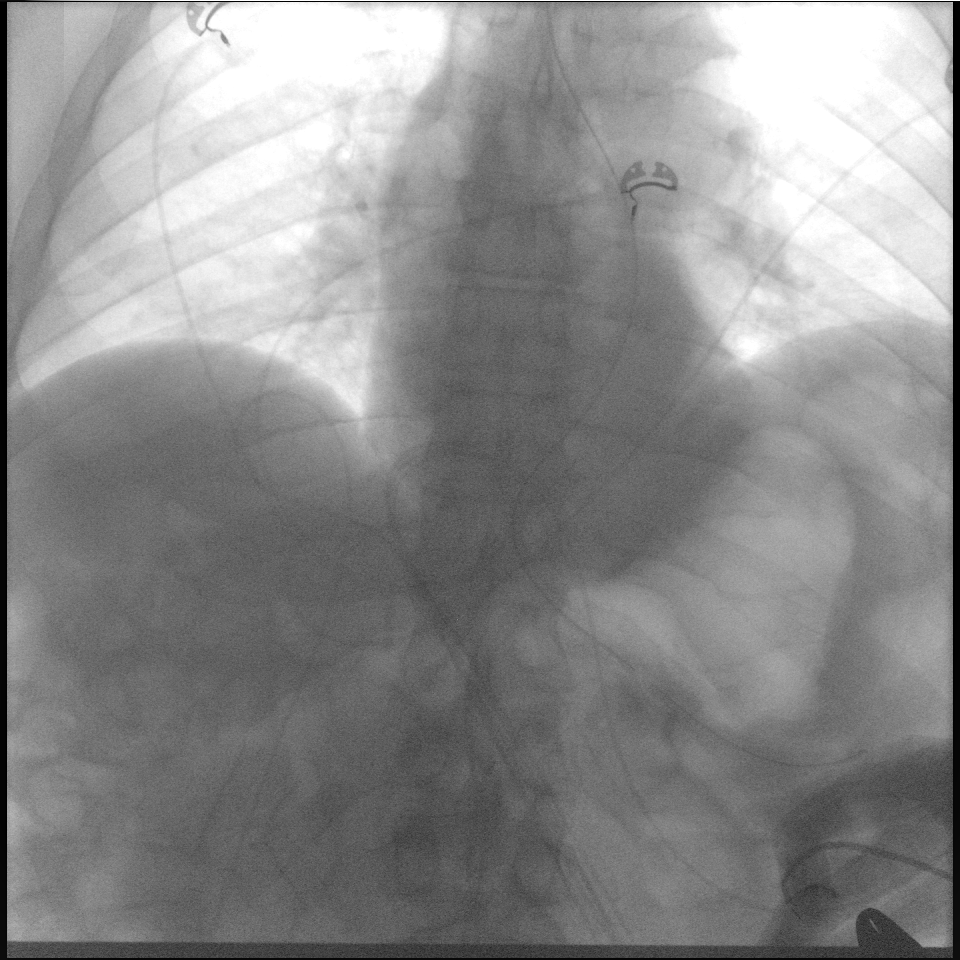

[1 of 1 positions shown; findings below may reference images not displayed]

FINDINGS: The anticipated procedure was explained to Mr. JASIEL. He voiced his
willingness to proceed. The 14 French nasogastric tube was placed
under fluoroscopic guidance via the left nostril. The tip of the
tube cane to lie in the proximal gastric body with the proximal port
in the gastric cardia. The patient tolerated the procedure
reasonably well.
IMPRESSION: Successful placement of a nasogastric tube with fluoroscopic
guidance. No immediate complication.

## 2017-11-21 IMAGING — CT CT ABD-PELV W/O CM
2 of 4 series · 14 of 36 positions shown, 17 images · non-contrast
Comparison: [DATE] CT angiogram of the chest.

CLINICAL DATA: 76 y/o  M; nausea, vomiting, abdominal pain.

EXAM:
CT CHEST, ABDOMEN AND PELVIS WITHOUT CONTRAST
TECHNIQUE: Multidetector CT imaging of the chest, abdomen and pelvis was
performed following the standard protocol without IV contrast.

[Series 11: lung · axial · 0.79mm/px · z∈[-359,-99]mm · 11 of 146 slices shown, 14 images]
[im 8/146  mediastinal]
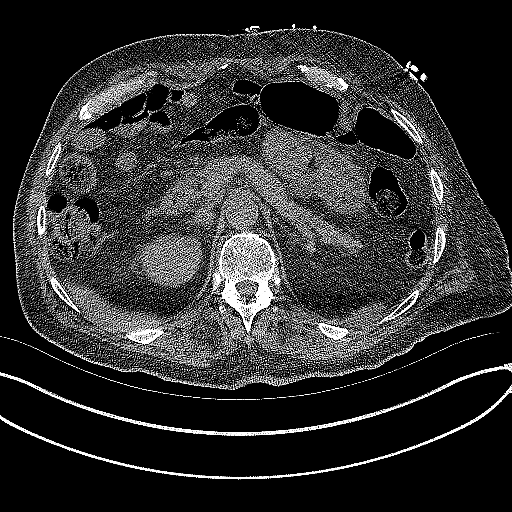
[im 8/146  lung]
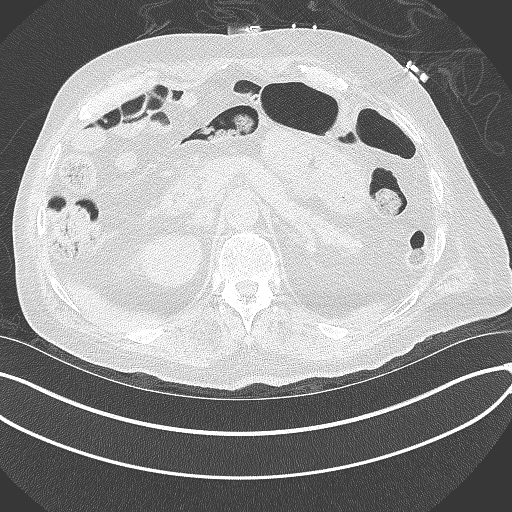
[im 23/146  lung]
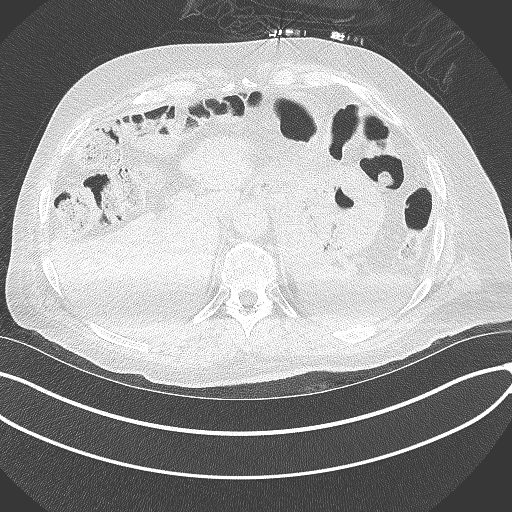
[im 39/146  lung]
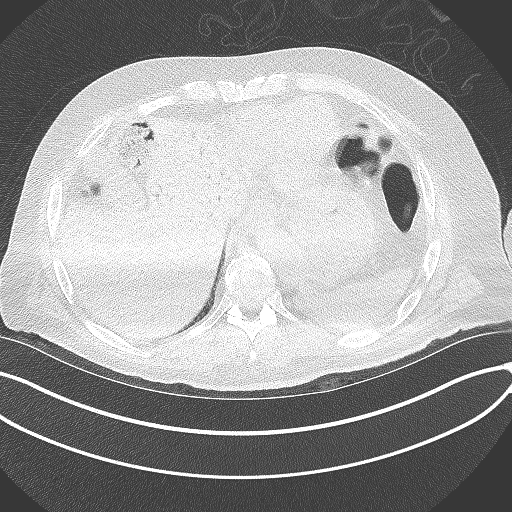
[im 46/146  lung]
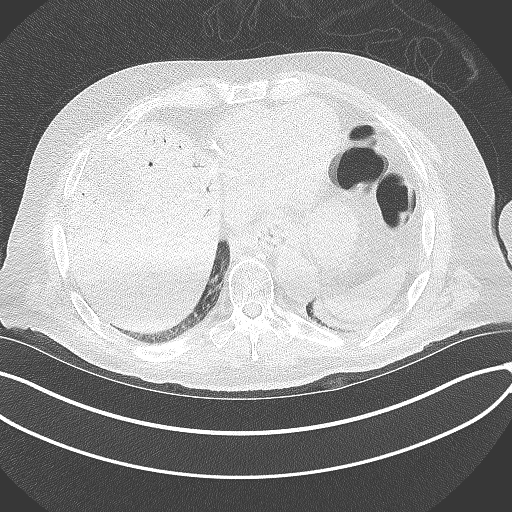
[im 62/146  mediastinal]
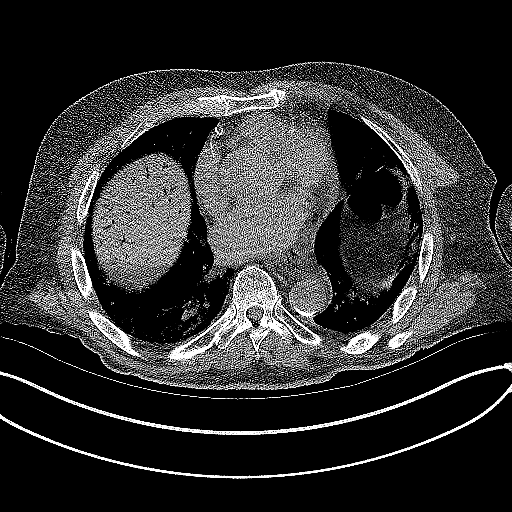
[im 62/146  lung]
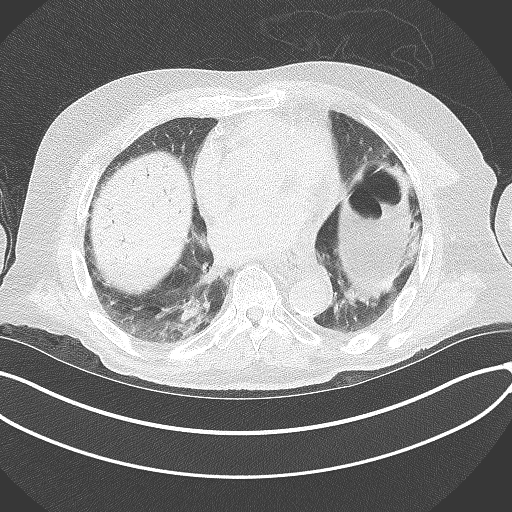
[im 77/146  lung]
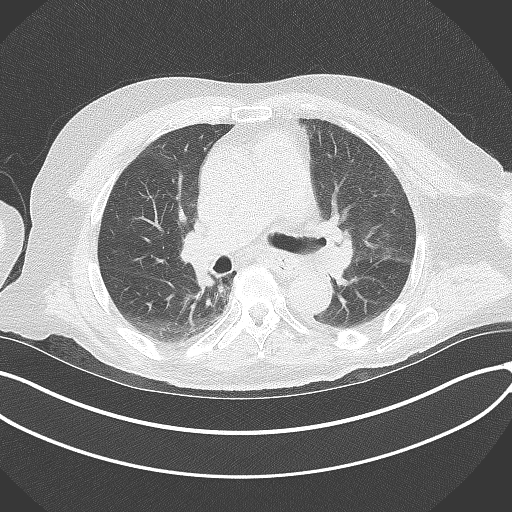
[im 84/146  lung]
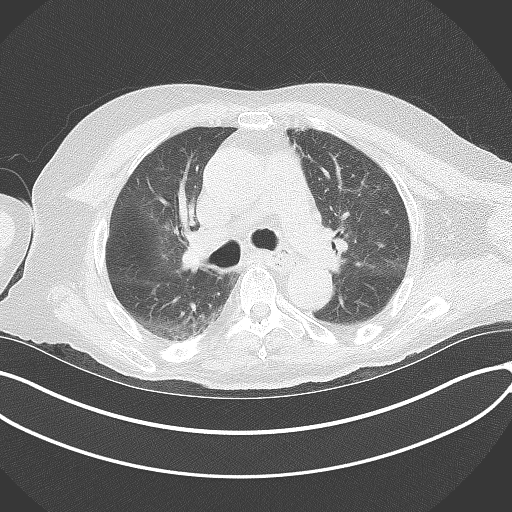
[im 100/146  lung]
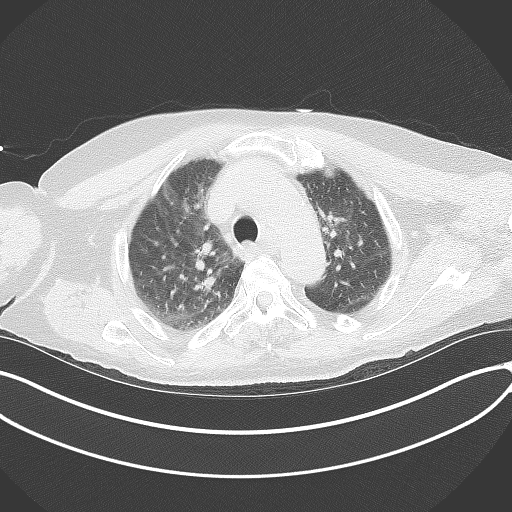
[im 107/146  mediastinal]
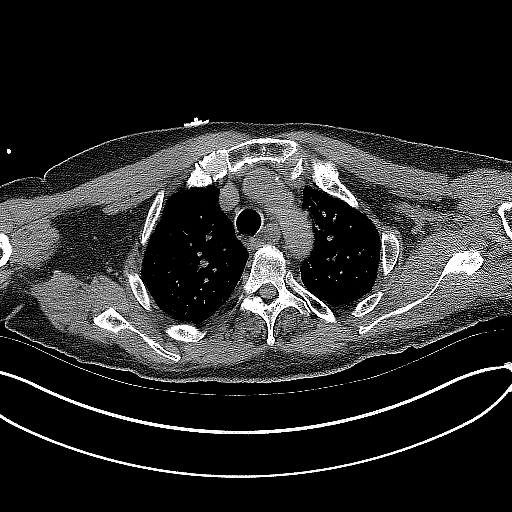
[im 107/146  lung]
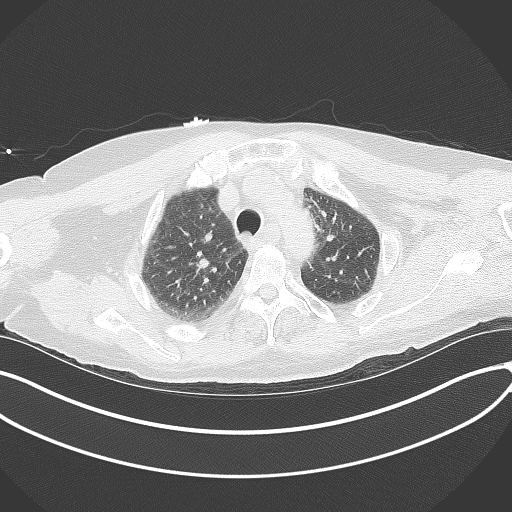
[im 123/146  lung]
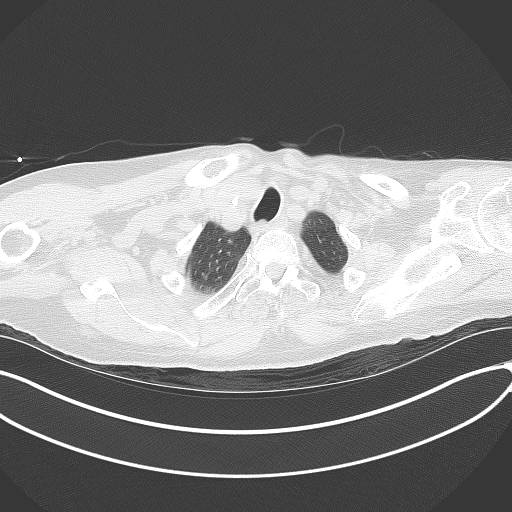
[im 138/146  lung]
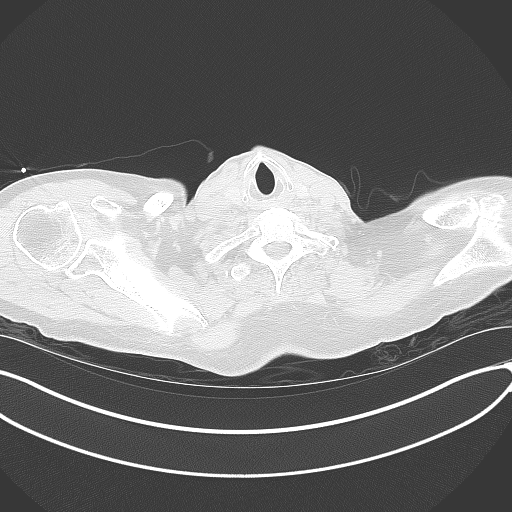

[Series 12: chest wo coronal- · coronal · 0.59mm/px · 3 of 130 slices shown]
[im 26/130  lung]
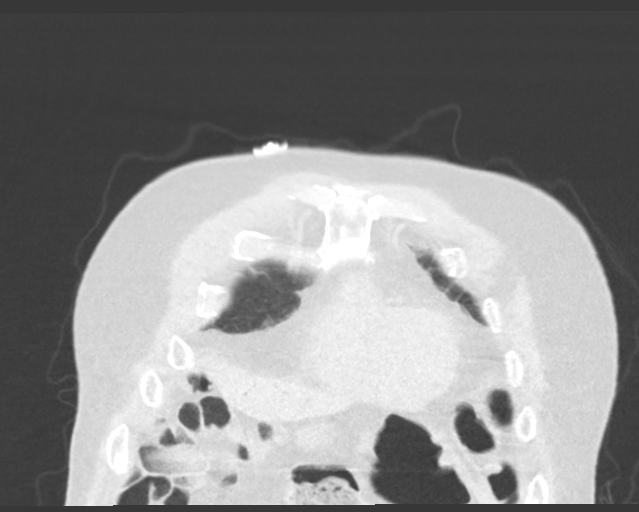
[im 52/130  lung]
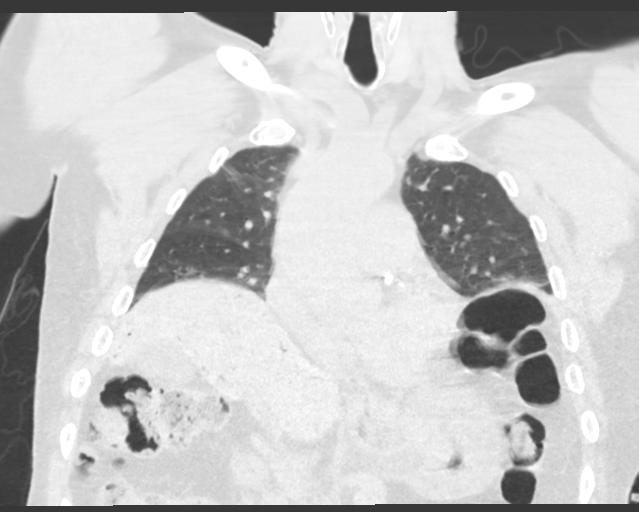
[im 78/130  lung]
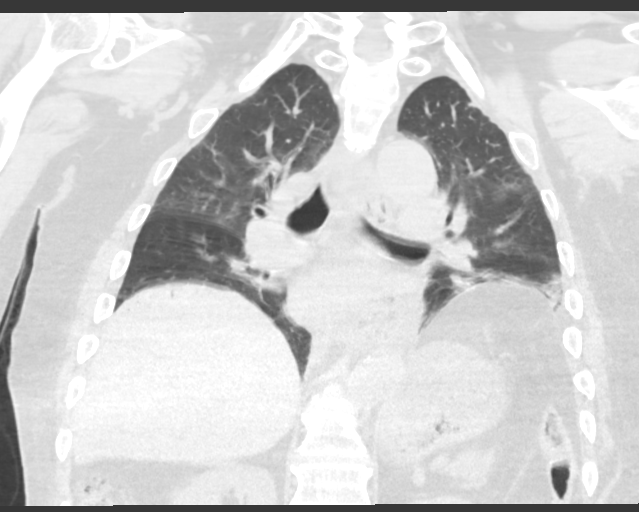

[14 of 36 positions shown; findings below may reference images not displayed]

FINDINGS: CT CHEST FINDINGS

Cardiovascular: Stable 4.4 cm ascending aortic aneurysm. Mild aortic
and severe coronary artery calcific atherosclerosis. Normal heart
size. No pericardial effusion.

Mediastinum/Nodes: No enlarged mediastinal, hilar, or axillary lymph
nodes. Thyroid gland, trachea, and esophagus demonstrate no
significant findings.

Lungs/Pleura: Peribronchial thickening and scattered mucous plugging
within the lower lobes. No consolidation. No pleural effusion. No
pneumothorax.

Musculoskeletal: No chest wall mass or suspicious bone lesions
identified.

CT ABDOMEN PELVIS FINDINGS

Hepatobiliary: Branching air within the liver which appears to be
predominantly peripheral, suspicious for portal venous gas. No
gallstones, gallbladder wall thickening, or biliary dilatation.

Pancreas: Unremarkable. No pancreatic ductal dilatation or
surrounding inflammatory changes.

Spleen: Normal in size without focal abnormality.

Adrenals/Urinary Tract: Adrenal glands are unremarkable. Kidneys are
normal, without renal calculi, focal lesion, or hydronephrosis.
Bladder is unremarkable. Contrast is present within the renal
collecting system.

Stomach/Bowel: Diffuse wall thickening of the stomach and anti
dependent pneumatosis in the posterior wall of the stomach body
(series 2 image 18-25). Appendix not identified, no pericecal
inflammation. No evidence of bowel wall thickening, distention, or
inflammatory changes of the small or large bowel.

Vascular/Lymphatic: Aortic atherosclerosis. No enlarged abdominal or
pelvic lymph nodes.

Reproductive: Prostatic calcifications. Mild prostate enlargement
extending into the floor of bladder.

Other: No abdominal wall hernia or abnormality. No abdominopelvic
ascites.

Musculoskeletal: Multilevel degenerative changes of the spine
greatest at L5-S1 with there is severe loss of intervertebral disc
space height. No acute osseous abnormality is evident.
IMPRESSION: 1. Liver portal venous gas and anti dependent pneumatosis within the
posterior wall of the gastric body. Suspected gastric infarction.
2. Stable 4.4 cm ascending aortic aneurysm. Recommend annual imaging
followup by CTA or MRA. This recommendation follows [DA]
ACCF/AHA/AATS/ACR/ASA/SCA/YAMILEX/YAMILEX/YAMILEX/YAMILEX Guidelines for the
Diagnosis and Management of Patients with Thoracic Aortic Disease.
Circulation. [DA]; 121: E266-e369.
3. Coronary and aortic calcific atherosclerosis.
4. Peribronchial thickening and mucous plugging in the lung bases
which may represent bronchitis or possibly aspiration given
distribution.

These results were called by telephone at the time of interpretation
on [DATE] at [DATE] to Dr. YAMILEX , who verbally
acknowledged these results.

By: YAMILEX M.D.

## 2017-11-21 MED ORDER — ONDANSETRON HCL 4 MG/2ML IJ SOLN
4.0000 mg | Freq: Once | INTRAMUSCULAR | Status: AC
  Administered 2017-11-21: 4 mg via INTRAVENOUS
  Filled 2017-11-21: qty 2

## 2017-11-21 MED ORDER — ONDANSETRON 4 MG PO TBDP
4.0000 mg | ORAL_TABLET | Freq: Once | ORAL | Status: AC
  Administered 2017-11-21: 4 mg via ORAL

## 2017-11-21 MED ORDER — FAMOTIDINE IN NACL 20-0.9 MG/50ML-% IV SOLN: 20.0000 mg | Freq: Two times a day (BID) | INTRAVENOUS | Status: DC

## 2017-11-21 MED ORDER — LORAZEPAM 2 MG/ML IJ SOLN
1.0000 mg | Freq: Once | INTRAMUSCULAR | Status: AC
  Administered 2017-11-21: 1 mg via INTRAVENOUS

## 2017-11-21 MED ORDER — ONDANSETRON 4 MG PO TBDP: 4.0000 mg | ORAL_TABLET | Freq: Four times a day (QID) | ORAL | Status: DC | PRN

## 2017-11-21 MED ORDER — PANTOPRAZOLE SODIUM 40 MG IV SOLR: 40.0000 mg | Freq: Two times a day (BID) | INTRAVENOUS | Status: DC

## 2017-11-21 MED ORDER — CIPROFLOXACIN IN D5W 400 MG/200ML IV SOLN
400.0000 mg | Freq: Two times a day (BID) | INTRAVENOUS | Status: DC
  Administered 2017-11-21 – 2017-11-22 (×2): 400 mg via INTRAVENOUS
  Filled 2017-11-21 (×3): qty 200

## 2017-11-21 MED ORDER — ONDANSETRON HCL 4 MG/2ML IJ SOLN: 4.0000 mg | Freq: Four times a day (QID) | INTRAMUSCULAR | Status: DC | PRN

## 2017-11-21 MED ORDER — HYDRALAZINE HCL 20 MG/ML IJ SOLN: 10.0000 mg | Freq: Four times a day (QID) | INTRAMUSCULAR | Status: DC | PRN

## 2017-11-21 MED ORDER — METRONIDAZOLE IN NACL 5-0.79 MG/ML-% IV SOLN
500.0000 mg | Freq: Three times a day (TID) | INTRAVENOUS | Status: DC
  Administered 2017-11-21 – 2017-11-22 (×4): 500 mg via INTRAVENOUS
  Filled 2017-11-21 (×5): qty 100

## 2017-11-21 MED ORDER — ENOXAPARIN SODIUM 40 MG/0.4ML ~~LOC~~ SOLN
40.0000 mg | SUBCUTANEOUS | Status: DC
  Administered 2017-11-21 – 2017-11-27 (×7): 40 mg via SUBCUTANEOUS
  Filled 2017-11-21 (×7): qty 0.4

## 2017-11-21 MED ORDER — METOPROLOL TARTRATE 5 MG/5ML IV SOLN: 5.0000 mg | Freq: Four times a day (QID) | INTRAVENOUS | Status: DC | PRN

## 2017-11-21 MED ORDER — ONDANSETRON 4 MG PO TBDP
ORAL_TABLET | ORAL | Status: AC
  Filled 2017-11-21: qty 1

## 2017-11-21 MED ORDER — LORAZEPAM 2 MG/ML IJ SOLN
INTRAMUSCULAR | Status: AC
  Filled 2017-11-21: qty 1

## 2017-11-21 MED ORDER — MORPHINE SULFATE (PF) 2 MG/ML IV SOLN
2.0000 mg | Freq: Once | INTRAVENOUS | Status: AC
  Administered 2017-11-21: 2 mg via INTRAVENOUS
  Filled 2017-11-21: qty 1

## 2017-11-21 MED ORDER — DEXTROSE IN LACTATED RINGERS 5 % IV SOLN
INTRAVENOUS | Status: DC
  Administered 2017-11-21 – 2017-11-26 (×13): via INTRAVENOUS

## 2017-11-21 MED ORDER — IOPAMIDOL (ISOVUE-370) INJECTION 76%
100.0000 mL | Freq: Once | INTRAVENOUS | Status: AC | PRN
  Administered 2017-11-21: 100 mL via INTRAVENOUS

## 2017-11-21 MED ORDER — SODIUM CHLORIDE 0.9 % IV SOLN
8.0000 mg/h | INTRAVENOUS | Status: DC
  Administered 2017-11-21 – 2017-11-22 (×3): 8 mg/h via INTRAVENOUS
  Filled 2017-11-21 (×3): qty 80

## 2017-11-21 MED ORDER — CIPROFLOXACIN IN D5W 400 MG/200ML IV SOLN
400.0000 mg | Freq: Once | INTRAVENOUS | Status: AC
  Administered 2017-11-21: 400 mg via INTRAVENOUS
  Filled 2017-11-21: qty 200

## 2017-11-21 MED ORDER — HALOPERIDOL LACTATE 5 MG/ML IJ SOLN
2.0000 mg | Freq: Once | INTRAMUSCULAR | Status: AC
  Administered 2017-11-21: 2 mg via INTRAVENOUS
  Filled 2017-11-21: qty 0.4

## 2017-11-21 MED ORDER — METRONIDAZOLE IN NACL 5-0.79 MG/ML-% IV SOLN
500.0000 mg | Freq: Once | INTRAVENOUS | Status: AC
  Administered 2017-11-21: 500 mg via INTRAVENOUS
  Filled 2017-11-21: qty 100

## 2017-11-21 MED ORDER — ONDANSETRON HCL 4 MG/2ML IJ SOLN: 4.0000 mg | Freq: Once | INTRAMUSCULAR | Status: DC

## 2017-11-21 MED ORDER — MORPHINE SULFATE (PF) 4 MG/ML IV SOLN: 4.0000 mg | INTRAVENOUS | Status: DC | PRN

## 2017-11-21 NOTE — Progress Notes (Signed)
Patient partially pulled out NG tube.  Contacted radiology regarding reinserting however no radiologist currently on campus.  Spoke with Dr. Dahlia Byes who advised to removed NG for now.  Continue to monitor.

## 2017-11-21 NOTE — ED Notes (Signed)
Pt remains hypertensive, md notified, no new orders received. Pt sleeping currently.

## 2017-11-21 NOTE — Progress Notes (Signed)
Upon assessment for a PIV, the patient has no veins visible via ultrasound in the range for a 1.88" catheter. Patient does have deeper veins but they are very limited. A 24 3/4 was placed in the left hand, however may not last for protonix. Beth RN made aware and recommended a central line if the patient was to continue to have IV medications at this time.

## 2017-11-21 NOTE — H&P (Signed)
[facility] Admission Note  Alex [name] 02-06-1941  [id number].    Requesting MD: Dr. Marjean Donna, MD Chief Complaint: Abdominal Pain  HPI:  Alex [name] is a 77 y.o. male who presented to the ED this morning via EMS for abdominal pain. Pt's wife helped contribute to the history given history of dementia. They report the patient noticed sharp epigastric pain around midnight associated with non-bilious non-bloody emesis. He has continued to have BMs without blood including one while in the ED. No complaints of chest pain, SOB, fever, or chills. His wife does endorse that the patient has been weaker than normal but in terms of ADLs has been able to feed himself, dress, and do some household chores.   In the ED, CT revealed venous portal gas and pneumatosis of the posterior gastric wall concerning for infarction.   ROS: Review of Systems  Constitutional: Negative for chills and fever.  Respiratory: Negative for cough and shortness of breath.   Cardiovascular: Negative for chest pain and palpitations.  Gastrointestinal: Positive for abdominal pain, nausea and vomiting. Negative for blood in stool, constipation and diarrhea.  Genitourinary: Negative for hematuria and urgency.  Neurological: Negative for dizziness and headaches.    Family History  Problem Relation Age of Onset  . Hypertension Mother     Past Medical History:  Diagnosis Date  . Alzheimer's dementia   . BPH (benign prostatic hyperplasia)   . CAD (coronary artery disease)   . Dementia   . Hypertension     Past Surgical History:  Procedure Laterality Date  . CORONARY ANGIOPLASTY WITH STENT PLACEMENT    . HERNIA REPAIR      Social History:  reports that he has never smoked. He has never used smokeless tobacco. He reports that he drinks alcohol. He reports that he does not use drugs.  Allergies:  Allergies  Allergen Reactions  . Penicillins Other (See Comments)    Unknown reaction,  childhood  Has patient had a PCN reaction causing immediate rash, facial/tongue/throat swelling, SOB or lightheadedness with hypotension: Yes Has patient had a PCN reaction causing severe rash involving mucus membranes or skin necrosis: No Has patient had a PCN reaction that required hospitalization: No Has patient had a PCN reaction occurring within the last 10 years: No If all of the above answers are "NO", then may proceed with Cephalosporin use.      (Not in a hospital admission)  Blood pressure (!) 160/103, pulse 80, temperature 98.4 F (36.9 C), temperature source Oral, resp. rate 19, SpO2 98 %. Physical Exam: Physical Exam  Constitutional: He appears well-developed and well-nourished.  Non-toxic appearance. He does not have a sickly appearance.  HENT:  Head: Normocephalic and atraumatic.  Eyes: Conjunctivae and lids are normal. No scleral icterus.  Cardiovascular: Normal rate, regular rhythm and intact distal pulses. Exam reveals no gallop and no friction rub.  No murmur heard. Pulmonary/Chest: Effort normal and breath sounds normal. No respiratory distress. He has no wheezes. He has no rhonchi. He has no rales.  Abdominal: Soft. Normal appearance and bowel sounds are normal. He exhibits no distension. There is tenderness in the epigastric area and left upper quadrant. There is no rigidity, no rebound and no guarding.  Musculoskeletal: He exhibits no edema or deformity.  Neurological: He is alert.  No gross FNDs  Skin: Skin is warm and dry. He is not diaphoretic.    Results for orders placed or performed during the hospital encounter of 11/21/17 (from the past 48  hour(s))  CBC     Status: Abnormal   Collection Time: 11/21/17  1:42 AM  Result Value Ref Range   WBC 14.5 (H) 3.8 - 10.6 K/uL   RBC 4.63 4.40 - 5.90 MIL/uL   Hemoglobin 14.3 13.0 - 18.0 g/dL   HCT 41.9 40.0 - 52.0 %   MCV 90.6 80.0 - 100.0 fL   MCH 30.9 26.0 - 34.0 pg   MCHC 34.1 32.0 - 36.0 g/dL   RDW 13.9  11.5 - 14.5 %   Platelets 109 (L) 150 - 440 K/uL    Comment: Performed at [facility], [address] [place], [place] [address]  Basic metabolic panel     Status: Abnormal   Collection Time: 11/21/17  2:23 AM  Result Value Ref Range   Sodium 143 135 - 145 mmol/L   Potassium 3.6 3.5 - 5.1 mmol/L    Comment: HEMOLYSIS AT THIS LEVEL MAY AFFECT RESULT   Chloride 113 (H) 98 - 111 mmol/L   CO2 22 22 - 32 mmol/L   Glucose, Bld 90 70 - 99 mg/dL   BUN 11 8 - 23 mg/dL   Creatinine, Ser 1.07 0.61 - 1.24 mg/dL   Calcium 6.6 (L) 8.9 - 10.3 mg/dL   GFR calc non Af Amer >60 >60 mL/min   GFR calc Af Amer >60 >60 mL/min    Comment: (NOTE) The eGFR has been calculated using the CKD EPI equation. This calculation has not been validated in all clinical situations. eGFR's persistently <60 mL/min signify possible Chronic Kidney Disease.    Anion gap 8 5 - 15    Comment: Performed at [facility], [address] [place], [place] [address]  Troponin I     Status: None   Collection Time: 11/21/17  2:23 AM  Result Value Ref Range   Troponin I <0.03 <0.03 ng/mL    Comment: Performed at [facility], [address] [place], [place] [address]  Troponin I     Status: None   Collection Time: 11/21/17  6:31 AM  Result Value Ref Range   Troponin I <0.03 <0.03 ng/mL    Comment: Performed at [facility], [place], [place] [address]   Ct Abdomen Pelvis Wo Contrast  Result Date: 11/21/2017 CLINICAL DATA:  77 y/o  M; nausea, vomiting, abdominal pain. EXAM: CT CHEST, ABDOMEN AND PELVIS WITHOUT CONTRAST TECHNIQUE: Multidetector CT imaging of the chest, abdomen and pelvis was performed following the standard protocol without IV contrast. COMPARISON:  08/15/2016 CT angiogram of the chest. FINDINGS: CT CHEST FINDINGS Cardiovascular: Stable 4.4 cm ascending aortic aneurysm. Mild aortic and severe coronary artery calcific atherosclerosis. Normal heart size. No  pericardial effusion. Mediastinum/Nodes: No enlarged mediastinal, hilar, or axillary lymph nodes. Thyroid gland, trachea, and esophagus demonstrate no significant findings. Lungs/Pleura: Peribronchial thickening and scattered mucous plugging within the lower lobes. No consolidation. No pleural effusion. No pneumothorax. Musculoskeletal: No chest wall mass or suspicious bone lesions identified. CT ABDOMEN PELVIS FINDINGS Hepatobiliary: Branching air within the liver which appears to be predominantly peripheral, suspicious for portal venous gas. No gallstones, gallbladder wall thickening, or biliary dilatation. Pancreas: Unremarkable. No pancreatic ductal dilatation or surrounding inflammatory changes. Spleen: Normal in size without focal abnormality. Adrenals/Urinary Tract: Adrenal glands are unremarkable. Kidneys are normal, without renal calculi, focal lesion, or hydronephrosis. Bladder is unremarkable. Contrast is present within the renal collecting system. Stomach/Bowel: Diffuse wall thickening of the stomach and anti dependent pneumatosis in the posterior wall  of the stomach body (series 2 image 18-25). Appendix not identified, no pericecal inflammation. No evidence of bowel wall thickening, distention, or inflammatory changes of the small or large bowel. Vascular/Lymphatic: Aortic atherosclerosis. No enlarged abdominal or pelvic lymph nodes. Reproductive: Prostatic calcifications. Mild prostate enlargement extending into the floor of bladder. Other: No abdominal wall hernia or abnormality. No abdominopelvic ascites. Musculoskeletal: Multilevel degenerative changes of the spine greatest at L5-S1 with there is severe loss of intervertebral disc space height. No acute osseous abnormality is evident. IMPRESSION: 1. Liver portal venous gas and anti dependent pneumatosis within the posterior wall of the gastric body. 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 2. Stable 4.4 cm ascending aortic aneurysm. Recommend annual imaging  followup by CTA or MRA. This recommendation follows 2010 ACCF/AHA/AATS/ACR/ASA/SCA/SCAI/SIR/STS/SVM Guidelines for the Diagnosis and Management of Patients with Thoracic Aortic Disease. Circulation. 2010; 121: H852-D782. 3. Coronary and aortic calcific atherosclerosis. 4. Peribronchial thickening and mucous plugging in the lung bases which may represent bronchitis or possibly aspiration given distribution. These results were called by telephone at the time of interpretation on 11/21/2017 at 6:49 am to Dr. Marjean Donna , who verbally acknowledged these results. Electronically Signed   By: Kristine Garbe M.D.   On: 11/21/2017 06:53   Ct Chest Wo Contrast  Result Date: 11/21/2017 CLINICAL DATA:  77 y/o  M; nausea, vomiting, abdominal pain. EXAM: CT CHEST, ABDOMEN AND PELVIS WITHOUT CONTRAST TECHNIQUE: Multidetector CT imaging of the chest, abdomen and pelvis was performed following the standard protocol without IV contrast. COMPARISON:  08/15/2016 CT angiogram of the chest. FINDINGS: CT CHEST FINDINGS Cardiovascular: Stable 4.4 cm ascending aortic aneurysm. Mild aortic and severe coronary artery calcific atherosclerosis. Normal heart size. No pericardial effusion. Mediastinum/Nodes: No enlarged mediastinal, hilar, or axillary lymph nodes. Thyroid gland, trachea, and esophagus demonstrate no significant findings. Lungs/Pleura: Peribronchial thickening and scattered mucous plugging within the lower lobes. No consolidation. No pleural effusion. No pneumothorax. Musculoskeletal: No chest wall mass or suspicious bone lesions identified. CT ABDOMEN PELVIS FINDINGS Hepatobiliary: Branching air within the liver which appears to be predominantly peripheral, suspicious for portal venous gas. No gallstones, gallbladder wall thickening, or biliary dilatation. Pancreas: Unremarkable. No pancreatic ductal dilatation or surrounding inflammatory changes. Spleen: Normal in size without focal abnormality. Adrenals/Urinary  Tract: Adrenal glands are unremarkable. Kidneys are normal, without renal calculi, focal lesion, or hydronephrosis. Bladder is unremarkable. Contrast is present within the renal collecting system. Stomach/Bowel: Diffuse wall thickening of the stomach and anti dependent pneumatosis in the posterior wall of the stomach body (series 2 image 18-25). Appendix not identified, no pericecal inflammation. No evidence of bowel wall thickening, distention, or inflammatory changes of the small or large bowel. Vascular/Lymphatic: Aortic atherosclerosis. No enlarged abdominal or pelvic lymph nodes. Reproductive: Prostatic calcifications. Mild prostate enlargement extending into the floor of bladder. Other: No abdominal wall hernia or abnormality. No abdominopelvic ascites. Musculoskeletal: Multilevel degenerative changes of the spine greatest at L5-S1 with there is severe loss of intervertebral disc space height. No acute osseous abnormality is evident. IMPRESSION: 1. Liver portal venous gas and anti dependent pneumatosis within the posterior wall of the gastric body. 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 2. Stable 4.4 cm ascending aortic aneurysm. Recommend annual imaging followup by CTA or MRA. This recommendation follows 2010 ACCF/AHA/AATS/ACR/ASA/SCA/SCAI/SIR/STS/SVM Guidelines for the Diagnosis and Management of Patients with Thoracic Aortic Disease. Circulation. 2010; 121: U235-T614. 3. Coronary and aortic calcific atherosclerosis. 4. Peribronchial thickening and mucous plugging in the lung bases which may represent bronchitis or possibly aspiration given distribution. These results were  called by telephone at the time of interpretation on 11/21/2017 at 6:49 am to Dr. Marjean Donna , who verbally acknowledged these results. Electronically Signed   By: Kristine Garbe M.D.   On: 11/21/2017 06:53   Dg Chest Port 1 View  Result Date: 11/21/2017 CLINICAL DATA:  Chest and abdominal pain EXAM: PORTABLE CHEST 1 VIEW  COMPARISON:  11/05/2017 FINDINGS: Bibasilar scarring. Heart is normal size. No acute confluent airspace opacity or effusion. No acute bony abnormality. IMPRESSION: Bibasilar scarring.  No active disease. Electronically Signed   By: Rolm Baptise M.D.   On: 11/21/2017 00:30      Assessment/Plan 1.  Admit to general sugery 2.  Plan for conservation management at this time. NPO, bowel rest, IVF, pain control (AVOID NSAIDs), antiemetics, antibiotics (Cipro/Flagyl). Abdominal XR in the morning. Will need to reassess with CT in 48-72 hours. Will continue to reassess with serial abdominal exams. Family understands there may be a need for surgical intervention, but is agreeable with conservative management at this time.   3. Will consult interventional radiology for placement of NG Tube under fluoroscopy to decrease risk of gastric perforation 3.  Will consult with hospitalist for management of chronic medical conditions.  4.  SCDs, Lovenox for DVT prophylaxis, PPI for PUD, Incentive Spirometry  Edison Simon, PA-C [facility] 11/21/2017, 8:21 AM

## 2017-11-21 NOTE — Consult Note (Signed)
[facility] [place] NAME: Alex [name]    MR#:  [id number]  DATE OF BIRTH:  02-12-1941  DATE OF ADMISSION:  11/21/2017  PRIMARY CARE PHYSICIAN: Patient, No Pcp Per   REQUESTING/REFERRING PHYSICIAN: Tylene Fantasia, PA-C CHIEF COMPLAINT:   Chief Complaint  Patient presents with  . Abdominal Pain  . Emesis    HISTORY OF PRESENT ILLNESS: Alex [name]  is a 77 y.o. male with a known history of Alzheimer's dementia, BPH, coronary artery disease, dementia and hypertension who was recently hospitalized at [facility] for possible congestive heart failure and was discharged recently who presents to the hospital with abdominal pain and emesis.  Since midnight.  Patient currently is drowsy from the pain medication his wife is at bedside.  She states that he has not been having any chest pain shortness of breath fevers or chills.  In the ED, CT revealed venous portal gas and pneumatosis of the posterior gastric wall concerning for infarction.    PAST MEDICAL HISTORY:   Past Medical History:  Diagnosis Date  . Alzheimer's dementia   . BPH (benign prostatic hyperplasia)   . CAD (coronary artery disease)   . Dementia   . Hypertension     PAST SURGICAL HISTORY:  Past Surgical History:  Procedure Laterality Date  . CORONARY ANGIOPLASTY WITH STENT PLACEMENT    . HERNIA REPAIR      SOCIAL HISTORY:  Social History   Tobacco Use  . Smoking status: Never Smoker  . Smokeless tobacco: Never Used  Substance Use Topics  . Alcohol use: Yes    Comment: rarely    FAMILY HISTORY:  Family History  Problem Relation Age of Onset  . Hypertension Mother     DRUG ALLERGIES:  Allergies  Allergen Reactions  . Penicillins Other (See Comments)    Unknown reaction, childhood  Has patient had a PCN reaction causing immediate rash, facial/tongue/throat swelling, SOB or lightheadedness with hypotension: Yes Has patient had a PCN reaction  causing severe rash involving mucus membranes or skin necrosis: No Has patient had a PCN reaction that required hospitalization: No Has patient had a PCN reaction occurring within the last 10 years: No If all of the above answers are "NO", then may proceed with Cephalosporin use.     REVIEW OF SYSTEMS:   CONSTITUTIONAL:  Unable to provide due to patient being drowsy  MEDICATIONS AT HOME:  Prior to Admission medications   Medication Sig Start Date End Date Taking? Authorizing Provider  amLODipine (NORVASC) 5 MG tablet Take 5 mg by mouth daily.   Yes [provider]  aspirin EC 81 MG tablet Take 81 mg by mouth daily.   Yes [provider]  finasteride (PROSCAR) 5 MG tablet Take 1 tablet (5 mg total) by mouth daily. 06/29/15  Yes Wieting, Richard, MD  furosemide (LASIX) 40 MG tablet Take 40 mg by mouth daily. 08/12/16  Yes [provider]  metoprolol succinate (TOPROL-XL) 25 MG 24 hr tablet Take 25 mg by mouth daily. Take with or immediately following a meal.    Yes [provider]  pravastatin (PRAVACHOL) 80 MG tablet Take 80 mg by mouth daily.   Yes [provider]  rivastigmine (EXELON) 6 MG capsule Take 6 mg by mouth 2 (two) times daily.   Yes [provider]  cloNIDine (CATAPRES) 0.1 MG tablet Take 1 tablet (0.1 mg total) by mouth 2 (two) times daily as needed. Patient not  taking: Reported on 11/21/2017 04/28/17   Paulette Blanch, MD  donepezil (ARICEPT) 10 MG tablet Take 1 tablet (10 mg total) by mouth at bedtime. Patient not taking: Reported on 08/15/2016 06/29/15   Loletha Grayer, MD  doxycycline (VIBRAMYCIN) 50 MG capsule Take 2 capsules (100 mg total) by mouth 2 (two) times daily. Patient not taking: Reported on 11/05/2017 01/30/17   Paulette Blanch, MD  HYDROcodone-acetaminophen [facility]) 5-325 MG tablet Take 1 tablet by mouth every 6 (six) hours as needed for moderate pain. Patient not taking: Reported on 11/05/2017 01/30/17   Paulette Blanch, MD   ibuprofen (ADVIL,MOTRIN) 800 MG tablet Take 1 tablet (800 mg total) by mouth every 8 (eight) hours as needed for moderate pain. Patient not taking: Reported on 11/21/2017 01/30/17   Paulette Blanch, MD      PHYSICAL EXAMINATION:   VITAL SIGNS: Blood pressure (!) 159/94, pulse 60, temperature 98 F (36.7 C), temperature source Oral, resp. rate 12, height 5\' 11"  (1.803 m), weight 95.1 kg, SpO2 100 %.  GENERAL:  77 y.o.-year-old patient lying in the bed with no acute distress.  EYES: Pupils equal, round, reactive to light and accommodation. No scleral icterus. Extraocular muscles intact.  HEENT: Head atraumatic, normocephalic. Oropharynx and nasopharynx clear.  NECK:  Supple, no jugular venous distention. No thyroid enlargement, no tenderness.  LUNGS: Normal breath sounds bilaterally, no wheezing, rales,rhonchi or crepitation. No use of accessory muscles of respiration.  CARDIOVASCULAR: S1, S2 normal. No murmurs, rubs, or gallops.  ABDOMEN: Soft, nontender, nondistended. Bowel sounds present. No organomegaly or mass.  EXTREMITIES: No pedal edema, cyanosis, or clubbing.  NEUROLOGIC: Patient drowsy  pSYCHIATRIC: Patient is drowsy  sKIN: No obvious rash, lesion, or ulcer.   LABORATORY PANEL:   CBC Recent Labs  Lab 11/21/17 0142  WBC 14.5*  HGB 14.3  HCT 41.9  PLT 109*  MCV 90.6  MCH 30.9  MCHC 34.1  RDW 13.9   ------------------------------------------------------------------------------------------------------------------  Chemistries  Recent Labs  Lab 11/21/17 0223  NA 143  K 3.6  CL 113*  CO2 22  GLUCOSE 90  BUN 11  CREATININE 1.07  CALCIUM 6.6*   ------------------------------------------------------------------------------------------------------------------ estimated creatinine clearance is 68 mL/min (by C-G formula based on SCr of 1.07 mg/dL). ------------------------------------------------------------------------------------------------------------------ No  results for input(s): TSH, T4TOTAL, T3FREE, THYROIDAB in the last 72 hours.  Invalid input(s): FREET3   Coagulation profile No results for input(s): INR, PROTIME in the last 168 hours. ------------------------------------------------------------------------------------------------------------------- No results for input(s): DDIMER in the last 72 hours. -------------------------------------------------------------------------------------------------------------------  Cardiac Enzymes Recent Labs  Lab 11/21/17 0223 11/21/17 0631  TROPONINI <0.03 <0.03   ------------------------------------------------------------------------------------------------------------------ Invalid input(s): POCBNP  ---------------------------------------------------------------------------------------------------------------  Urinalysis    Component Value Date/Time   COLORURINE YELLOW (A) 11/05/2017 1052   APPEARANCEUR CLEAR (A) 11/05/2017 1052   LABSPEC 1.018 11/05/2017 1052   PHURINE 5.0 11/05/2017 1052   GLUCOSEU NEGATIVE 11/05/2017 1052   HGBUR SMALL (A) 11/05/2017 1052   BILIRUBINUR NEGATIVE 11/05/2017 1052   KETONESUR NEGATIVE 11/05/2017 1052   PROTEINUR NEGATIVE 11/05/2017 1052   NITRITE NEGATIVE 11/05/2017 1052   LEUKOCYTESUR NEGATIVE 11/05/2017 1052     RADIOLOGY: Ct Abdomen Pelvis Wo Contrast  Result Date: 11/21/2017 CLINICAL DATA:  77 y/o  M; nausea, vomiting, abdominal pain. EXAM: CT CHEST, ABDOMEN AND PELVIS WITHOUT CONTRAST TECHNIQUE: Multidetector CT imaging of the chest, abdomen and pelvis was performed following the standard protocol without IV contrast. COMPARISON:  08/15/2016 CT angiogram of the chest. FINDINGS: CT CHEST FINDINGS Cardiovascular: Stable 4.4  cm ascending aortic aneurysm. Mild aortic and severe coronary artery calcific atherosclerosis. Normal heart size. No pericardial effusion. Mediastinum/Nodes: No enlarged mediastinal, hilar, or axillary lymph nodes. Thyroid  gland, trachea, and esophagus demonstrate no significant findings. Lungs/Pleura: Peribronchial thickening and scattered mucous plugging within the lower lobes. No consolidation. No pleural effusion. No pneumothorax. Musculoskeletal: No chest wall mass or suspicious bone lesions identified. CT ABDOMEN PELVIS FINDINGS Hepatobiliary: Branching air within the liver which appears to be predominantly peripheral, suspicious for portal venous gas. No gallstones, gallbladder wall thickening, or biliary dilatation. Pancreas: Unremarkable. No pancreatic ductal dilatation or surrounding inflammatory changes. Spleen: Normal in size without focal abnormality. Adrenals/Urinary Tract: Adrenal glands are unremarkable. Kidneys are normal, without renal calculi, focal lesion, or hydronephrosis. Bladder is unremarkable. Contrast is present within the renal collecting system. Stomach/Bowel: Diffuse wall thickening of the stomach and anti dependent pneumatosis in the posterior wall of the stomach body (series 2 image 18-25). Appendix not identified, no pericecal inflammation. No evidence of bowel wall thickening, distention, or inflammatory changes of the small or large bowel. Vascular/Lymphatic: Aortic atherosclerosis. No enlarged abdominal or pelvic lymph nodes. Reproductive: Prostatic calcifications. Mild prostate enlargement extending into the floor of bladder. Other: No abdominal wall hernia or abnormality. No abdominopelvic ascites. Musculoskeletal: Multilevel degenerative changes of the spine greatest at L5-S1 with there is severe loss of intervertebral disc space height. No acute osseous abnormality is evident. IMPRESSION: 1. Liver portal venous gas and anti dependent pneumatosis within the posterior wall of the gastric body. 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 2. Stable 4.4 cm ascending aortic aneurysm. Recommend annual imaging followup by CTA or MRA. This recommendation follows 2010 ACCF/AHA/AATS/ACR/ASA/SCA/SCAI/SIR/STS/SVM  Guidelines for the Diagnosis and Management of Patients with Thoracic Aortic Disease. Circulation. 2010; 121: G295-M841. 3. Coronary and aortic calcific atherosclerosis. 4. Peribronchial thickening and mucous plugging in the lung bases which may represent bronchitis or possibly aspiration given distribution. These results were called by telephone at the time of interpretation on 11/21/2017 at 6:49 am to Dr. Marjean Donna , who verbally acknowledged these results. Electronically Signed   By: Kristine Garbe M.D.   On: 11/21/2017 06:53   Ct Chest Wo Contrast  Result Date: 11/21/2017 CLINICAL DATA:  77 y/o  M; nausea, vomiting, abdominal pain. EXAM: CT CHEST, ABDOMEN AND PELVIS WITHOUT CONTRAST TECHNIQUE: Multidetector CT imaging of the chest, abdomen and pelvis was performed following the standard protocol without IV contrast. COMPARISON:  08/15/2016 CT angiogram of the chest. FINDINGS: CT CHEST FINDINGS Cardiovascular: Stable 4.4 cm ascending aortic aneurysm. Mild aortic and severe coronary artery calcific atherosclerosis. Normal heart size. No pericardial effusion. Mediastinum/Nodes: No enlarged mediastinal, hilar, or axillary lymph nodes. Thyroid gland, trachea, and esophagus demonstrate no significant findings. Lungs/Pleura: Peribronchial thickening and scattered mucous plugging within the lower lobes. No consolidation. No pleural effusion. No pneumothorax. Musculoskeletal: No chest wall mass or suspicious bone lesions identified. CT ABDOMEN PELVIS FINDINGS Hepatobiliary: Branching air within the liver which appears to be predominantly peripheral, suspicious for portal venous gas. No gallstones, gallbladder wall thickening, or biliary dilatation. Pancreas: Unremarkable. No pancreatic ductal dilatation or surrounding inflammatory changes. Spleen: Normal in size without focal abnormality. Adrenals/Urinary Tract: Adrenal glands are unremarkable. Kidneys are normal, without renal calculi, focal lesion, or  hydronephrosis. Bladder is unremarkable. Contrast is present within the renal collecting system. Stomach/Bowel: Diffuse wall thickening of the stomach and anti dependent pneumatosis in the posterior wall of the stomach body (series 2 image 18-25). Appendix not identified, no pericecal inflammation. No evidence of bowel wall thickening,  distention, or inflammatory changes of the small or large bowel. Vascular/Lymphatic: Aortic atherosclerosis. No enlarged abdominal or pelvic lymph nodes. Reproductive: Prostatic calcifications. Mild prostate enlargement extending into the floor of bladder. Other: No abdominal wall hernia or abnormality. No abdominopelvic ascites. Musculoskeletal: Multilevel degenerative changes of the spine greatest at L5-S1 with there is severe loss of intervertebral disc space height. No acute osseous abnormality is evident. IMPRESSION: 1. Liver portal venous gas and anti dependent pneumatosis within the posterior wall of the gastric body. 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 2. Stable 4.4 cm ascending aortic aneurysm. Recommend annual imaging followup by CTA or MRA. This recommendation follows 2010 ACCF/AHA/AATS/ACR/ASA/SCA/SCAI/SIR/STS/SVM Guidelines for the Diagnosis and Management of Patients with Thoracic Aortic Disease. Circulation. 2010; 121: G867-Y195. 3. Coronary and aortic calcific atherosclerosis. 4. Peribronchial thickening and mucous plugging in the lung bases which may represent bronchitis or possibly aspiration given distribution. These results were called by telephone at the time of interpretation on 11/21/2017 at 6:49 am to Dr. Marjean Donna , who verbally acknowledged these results. Electronically Signed   By: Kristine Garbe M.D.   On: 11/21/2017 06:53   Dg Chest Port 1 View  Result Date: 11/21/2017 CLINICAL DATA:  Chest and abdominal pain EXAM: PORTABLE CHEST 1 VIEW COMPARISON:  11/05/2017 FINDINGS: Bibasilar scarring. Heart is normal size. No acute confluent airspace  opacity or effusion. No acute bony abnormality. IMPRESSION: Bibasilar scarring.  No active disease. Electronically Signed   By: Rolm Baptise M.D.   On: 11/21/2017 00:30    EKG: Orders placed or performed during the hospital encounter of 11/21/17  . ED EKG  . ED EKG  . EKG 12-Lead  . EKG 12-Lead    IMPRESSION AND PLAN: Patient 77 year old with history of coronary artery disease possible congestive heart failure presenting with abdominal pain  1.  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 No echo available patient will need his preop  2.  Essential hypertension Due to patient being n.p.o. we will use IV hydralazine as needed  3.  Coronary artery disease with no chest pain Cardiac enzymes negative EKG unrevealing Hold aspirin other cardiac meds for now until able to take oral p.o.  4.  Alzheimer's dementia  5.  BPH resume Proscar when able to take oral     All the records are reviewed  Management plans discussed with the patient, family and they are in agreement.  CODE STATUS: Code Status History    Date Active Date Inactive Code Status Order ID Comments User Context   08/15/2016 1017 08/15/2016 1651 Full Code [id number]  Gladstone Lighter, MD Inpatient   06/27/2015 1938 06/29/2015 1414 Full Code [id number]  Sylvan Cheese, MD Inpatient       TOTAL TIME TAKING CARE OF THIS PATIENT: 55 minutes.    Dustin Flock M.D on 11/21/2017 at 10:06 AM  Between 7am to 6pm - Pager - [phone]  After 6pm go to [email or web address] - password [email or web address] [facility]  [employer] [facility]  [phone]  CC: Primary care physician; Patient, No Pcp Per

## 2017-11-21 NOTE — ED Notes (Signed)
MD Pabon at bedside

## 2017-11-21 NOTE — Progress Notes (Signed)
*  PRELIMINARY RESULTS* Echocardiogram 2D Echocardiogram has been performed.  Alex [name] 11/21/2017, 2:51 PM

## 2017-11-21 NOTE — ED Notes (Signed)
Have not been able to check a blood pressure since 0430 due to repeated trips back and forth to ct and pt's removal of cuff in interim.

## 2017-11-21 NOTE — ED Triage Notes (Signed)
Pt presents via EMS: report epigastric pain starting at 1900 yesterday. Pt vomiting x 4-5 small amounts each time. Pt is mildly confused, lives at home w/ wife. Pt was diaphoretic upon EMS arrival.

## 2017-11-21 NOTE — ED Notes (Signed)
Lab called for venipuncture assist. irenes states will send phelbotomist.

## 2017-11-21 NOTE — ED Notes (Signed)
Pt with nausea, thinks he is going to vomit. Order for zofran received.

## 2017-11-21 NOTE — ED Notes (Signed)
Pt returned from ct scan

## 2017-11-21 NOTE — ED Notes (Signed)
Report from [place], rn.

## 2017-11-21 NOTE — ED Notes (Signed)
Second iv that noel, rn has started infiltrated in ct. md notified, warm pack applied. Pt back to ct for non contrast ct.

## 2017-11-21 NOTE — ED Notes (Signed)
Report to [place], rn.

## 2017-11-21 NOTE — ED Notes (Signed)
Pt to ct scan.

## 2017-11-21 NOTE — ED Notes (Signed)
Per ct tech, pt's iv has blown. Additional iv team consult placed.

## 2017-11-21 NOTE — ED Provider Notes (Signed)
[facility] Emergency Department Provider Note    First MD Initiated Contact with Patient 11/21/17 0007     (approximate)  I have reviewed the triage vital signs and the nursing notes.   HISTORY  Chief Complaint Abdominal Pain and Emesis    HPI Alex [name] is a 77 y.o. male with below list of chronic medical conditions including CAD presents to the emergency department with acute onset of epigastric abdominal pain radiating to back and chest that the patient describes as sharp which began approximately 1 hour ago.  Patient denies any dyspnea.  Past Medical History:  Diagnosis Date  . Alzheimer's dementia   . BPH (benign prostatic hyperplasia)   . CAD (coronary artery disease)   . Dementia   . Hypertension     Patient Active Problem List   Diagnosis Date Noted  . Chest pain 06/27/2015  . Uncontrolled hypertension 06/27/2015  . Bradycardia 06/27/2015  . Pleural thickening 06/27/2015    Past Surgical History:  Procedure Laterality Date  . CORONARY ANGIOPLASTY WITH STENT PLACEMENT    . HERNIA REPAIR      Prior to Admission medications   Medication Sig Start Date End Date Taking? Authorizing Provider  amLODipine (NORVASC) 5 MG tablet Take 5 mg by mouth daily.    [provider]  aspirin EC 81 MG tablet Take 81 mg by mouth daily.    [provider]  cloNIDine (CATAPRES) 0.1 MG tablet Take 1 tablet (0.1 mg total) by mouth 2 (two) times daily as needed. Patient taking differently: Take 0.1 mg by mouth daily.  04/28/17   Paulette Blanch, MD  donepezil (ARICEPT) 10 MG tablet Take 1 tablet (10 mg total) by mouth at bedtime. Patient not taking: Reported on 08/15/2016 06/29/15   Loletha Grayer, MD  doxycycline (VIBRAMYCIN) 50 MG capsule Take 2 capsules (100 mg total) by mouth 2 (two) times daily. Patient not taking: Reported on 11/05/2017 01/30/17   Paulette Blanch, MD  finasteride (PROSCAR) 5 MG tablet Take 1 tablet (5 mg total) by mouth  daily. 06/29/15   Loletha Grayer, MD  furosemide (LASIX) 40 MG tablet Take 40 mg by mouth daily. 08/12/16   [provider]  HYDROcodone-acetaminophen (NORCO) 5-325 MG tablet Take 1 tablet by mouth every 6 (six) hours as needed for moderate pain. Patient not taking: Reported on 11/05/2017 01/30/17   Paulette Blanch, MD  ibuprofen (ADVIL,MOTRIN) 800 MG tablet Take 1 tablet (800 mg total) by mouth every 8 (eight) hours as needed for moderate pain. 01/30/17   Paulette Blanch, MD  lisinopril (PRINIVIL,ZESTRIL) 40 MG tablet Take 40 mg by mouth daily.    [provider]  metoprolol succinate (TOPROL-XL) 50 MG 24 hr tablet Take 50 mg by mouth daily. Take with or immediately following a meal.    [provider]  pravastatin (PRAVACHOL) 80 MG tablet Take 80 mg by mouth daily.    [provider]  rivastigmine (EXELON) 6 MG capsule Take 6 mg by mouth 2 (two) times daily.    [provider]    Allergies Penicillins  Family History  Problem Relation Age of Onset  . Hypertension Mother     Social History Social History   Tobacco Use  . Smoking status: Never Smoker  . Smokeless tobacco: Never Used  Substance Use Topics  . Alcohol use: Yes    Comment: rarely  . Drug use: No    Review of Systems Constitutional: No fever/chills Eyes:  No visual changes. ENT: No sore throat. Cardiovascular: Positive for chest pain. Respiratory: Denies shortness of breath. Gastrointestinal: No abdominal pain.  No nausea, no vomiting.  No diarrhea.  No constipation. Genitourinary: Negative for dysuria. Musculoskeletal: Negative for neck pain.  Negative for back pain. Integumentary: Negative for rash. Neurological: Negative for headaches, focal weakness or numbness.  ____________________________________________   PHYSICAL EXAM:  VITAL SIGNS: ED Triage Vitals  Enc Vitals Group     BP 11/21/17 0008 (!) 183/98     Pulse Rate 11/21/17 0008 61     Resp 11/21/17 0008 (!) 9       Temp 11/21/17 0008 98.4 F (36.9 C)     Temp Source 11/21/17 0008 Oral     SpO2 11/21/17 0002 96 %     Weight --      Height --      Head Circumference --      Peak Flow --      Pain Score 11/21/17 0009 0     Pain Loc --      Pain Edu? --      Excl. in [place]? --     Constitutional: Alert and oriented. Well appearing and in no acute distress. Eyes: Conjunctivae are normal. PERRL. EOMI. Head: Atraumatic. Mouth/Throat: Mucous membranes are moist.  Oropharynx non-erythematous. Neck: No stridor.   Cardiovascular: Normal rate, regular rhythm. Good peripheral circulation. Grossly normal heart sounds. Respiratory: Normal respiratory effort.  No retractions. Lungs CTAB. Gastrointestinal: Epigastric tenderness to palpation. No distention.  Musculoskeletal: No lower extremity tenderness nor edema. No gross deformities of extremities. Neurologic:  Normal speech and language. No gross focal neurologic deficits are appreciated.  Skin:  Skin is warm, dry and intact. No rash noted. Psychiatric: Mood and affect are normal. Speech and behavior are normal.  ____________________________________________   LABS (all labs ordered are listed, but only abnormal results are displayed)  Labs Reviewed  CBC - Abnormal; Notable for the following components:      Result Value   WBC 14.5 (*)    Platelets 109 (*)    All other components within normal limits  BASIC METABOLIC PANEL - Abnormal; Notable for the following components:   Chloride 113 (*)    Calcium 6.6 (*)    All other components within normal limits  TROPONIN I  TROPONIN I   ____________________________________________  EKG  ED ECG REPORT I, [place] Jassmine Vandruff, the attending physician, personally viewed and interpreted this ECG.   Date: 11/21/2017  EKG Time: 12:10 AM  Rate: 62  Rhythm: Normal sinus rhythm  Axis: Normal  Intervals: Normal  ST&T Change: None  ____________________________________________  RADIOLOGY I, [place]  Itza Maniaci, personally viewed and evaluated these images (plain radiographs) as part of my medical decision making, as well as reviewing the written report by the radiologist.  ED MD interpretation: Pneumatosis posterior gastric wall with portal venous air noted.  Return for gastric infarct  Official radiology report(s): Ct Abdomen Pelvis Wo Contrast  Result Date: 11/21/2017 CLINICAL DATA:  77 y/o  M; nausea, vomiting, abdominal pain. EXAM: CT CHEST, ABDOMEN AND PELVIS WITHOUT CONTRAST TECHNIQUE: Multidetector CT imaging of the chest, abdomen and pelvis was performed following the standard protocol without IV contrast. COMPARISON:  08/15/2016 CT angiogram of the chest. FINDINGS: CT CHEST FINDINGS Cardiovascular: Stable 4.4 cm ascending aortic aneurysm. Mild aortic and severe coronary artery calcific atherosclerosis. Normal heart size. No pericardial effusion. Mediastinum/Nodes: No enlarged mediastinal, hilar, or axillary lymph nodes. Thyroid gland, trachea, and esophagus demonstrate  no significant findings. Lungs/Pleura: Peribronchial thickening and scattered mucous plugging within the lower lobes. No consolidation. No pleural effusion. No pneumothorax. Musculoskeletal: No chest wall mass or suspicious bone lesions identified. CT ABDOMEN PELVIS FINDINGS Hepatobiliary: Branching air within the liver which appears to be predominantly peripheral, suspicious for portal venous gas. No gallstones, gallbladder wall thickening, or biliary dilatation. Pancreas: Unremarkable. No pancreatic ductal dilatation or surrounding inflammatory changes. Spleen: Normal in size without focal abnormality. Adrenals/Urinary Tract: Adrenal glands are unremarkable. Kidneys are normal, without renal calculi, focal lesion, or hydronephrosis. Bladder is unremarkable. Contrast is present within the renal collecting system. Stomach/Bowel: Diffuse wall thickening of the stomach and anti dependent pneumatosis in the posterior wall of the stomach  body (series 2 image 18-25). Appendix not identified, no pericecal inflammation. No evidence of bowel wall thickening, distention, or inflammatory changes of the small or large bowel. Vascular/Lymphatic: Aortic atherosclerosis. No enlarged abdominal or pelvic lymph nodes. Reproductive: Prostatic calcifications. Mild prostate enlargement extending into the floor of bladder. Other: No abdominal wall hernia or abnormality. No abdominopelvic ascites. Musculoskeletal: Multilevel degenerative changes of the spine greatest at L5-S1 with there is severe loss of intervertebral disc space height. No acute osseous abnormality is evident. IMPRESSION: 1. Liver portal venous gas and anti dependent pneumatosis within the posterior wall of the gastric body. 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 2. Stable 4.4 cm ascending aortic aneurysm. Recommend annual imaging followup by CTA or MRA. This recommendation follows 2010 ACCF/AHA/AATS/ACR/ASA/SCA/SCAI/SIR/STS/SVM Guidelines for the Diagnosis and Management of Patients with Thoracic Aortic Disease. Circulation. 2010; 121: Z610-R604. 3. Coronary and aortic calcific atherosclerosis. 4. Peribronchial thickening and mucous plugging in the lung bases which may represent bronchitis or possibly aspiration given distribution. These results were called by telephone at the time of interpretation on 11/21/2017 at 6:49 am to Dr. Marjean Donna , who verbally acknowledged these results. Electronically Signed   By: Kristine Garbe M.D.   On: 11/21/2017 06:53   Ct Chest Wo Contrast  Result Date: 11/21/2017 CLINICAL DATA:  77 y/o  M; nausea, vomiting, abdominal pain. EXAM: CT CHEST, ABDOMEN AND PELVIS WITHOUT CONTRAST TECHNIQUE: Multidetector CT imaging of the chest, abdomen and pelvis was performed following the standard protocol without IV contrast. COMPARISON:  08/15/2016 CT angiogram of the chest. FINDINGS: CT CHEST FINDINGS Cardiovascular: Stable 4.4 cm ascending aortic aneurysm. Mild aortic  and severe coronary artery calcific atherosclerosis. Normal heart size. No pericardial effusion. Mediastinum/Nodes: No enlarged mediastinal, hilar, or axillary lymph nodes. Thyroid gland, trachea, and esophagus demonstrate no significant findings. Lungs/Pleura: Peribronchial thickening and scattered mucous plugging within the lower lobes. No consolidation. No pleural effusion. No pneumothorax. Musculoskeletal: No chest wall mass or suspicious bone lesions identified. CT ABDOMEN PELVIS FINDINGS Hepatobiliary: Branching air within the liver which appears to be predominantly peripheral, suspicious for portal venous gas. No gallstones, gallbladder wall thickening, or biliary dilatation. Pancreas: Unremarkable. No pancreatic ductal dilatation or surrounding inflammatory changes. Spleen: Normal in size without focal abnormality. Adrenals/Urinary Tract: Adrenal glands are unremarkable. Kidneys are normal, without renal calculi, focal lesion, or hydronephrosis. Bladder is unremarkable. Contrast is present within the renal collecting system. Stomach/Bowel: Diffuse wall thickening of the stomach and anti dependent pneumatosis in the posterior wall of the stomach body (series 2 image 18-25). Appendix not identified, no pericecal inflammation. No evidence of bowel wall thickening, distention, or inflammatory changes of the small or large bowel. Vascular/Lymphatic: Aortic atherosclerosis. No enlarged abdominal or pelvic lymph nodes. Reproductive: Prostatic calcifications. Mild prostate enlargement extending into the floor of bladder. Other: No  abdominal wall hernia or abnormality. No abdominopelvic ascites. Musculoskeletal: Multilevel degenerative changes of the spine greatest at L5-S1 with there is severe loss of intervertebral disc space height. No acute osseous abnormality is evident. IMPRESSION: 1. Liver portal venous gas and anti dependent pneumatosis within the posterior wall of the gastric body. Suspected gastric  infarction. 2. Stable 4.4 cm ascending aortic aneurysm. Recommend annual imaging followup by CTA or MRA. This recommendation follows 2010 ACCF/AHA/AATS/ACR/ASA/SCA/SCAI/SIR/STS/SVM Guidelines for the Diagnosis and Management of Patients with Thoracic Aortic Disease. Circulation. 2010; 121: F751-W258. 3. Coronary and aortic calcific atherosclerosis. 4. Peribronchial thickening and mucous plugging in the lung bases which may represent bronchitis or possibly aspiration given distribution. These results were called by telephone at the time of interpretation on 11/21/2017 at 6:49 am to Dr. Marjean Donna , who verbally acknowledged these results. Electronically Signed   By: Kristine Garbe M.D.   On: 11/21/2017 06:53   Dg Chest Port 1 View  Result Date: 11/21/2017 CLINICAL DATA:  Chest and abdominal pain EXAM: PORTABLE CHEST 1 VIEW COMPARISON:  11/05/2017 FINDINGS: Bibasilar scarring. Heart is normal size. No acute confluent airspace opacity or effusion. No acute bony abnormality. IMPRESSION: Bibasilar scarring.  No active disease. Electronically Signed   By: Rolm Baptise M.D.   On: 11/21/2017 00:30     Critical Care performed:  CRITICAL CARE Performed by: Gregor Hams   Total critical care time: 45 minutes  Critical care time was exclusive of separately billable procedures and treating other patients.  Critical care was necessary to treat or prevent imminent or life-threatening deterioration.  Critical care was time spent personally by me on the following activities: development of treatment plan with patient and/or surrogate as well as nursing, discussions with consultants, evaluation of patient's response to treatment, examination of patient, obtaining history from patient or surrogate, ordering and performing treatments and interventions, ordering and review of laboratory studies, ordering and review of radiographic studies, pulse oximetry and re-evaluation of patient's  condition.    ____________________________________________   INITIAL IMPRESSION / ASSESSMENT AND PLAN / ED COURSE  As part of my medical decision making, I reviewed the following data within the electronic MEDICAL RECORD NUMBER   77 year old male presenting with above-stated history and physical exam concerning for intrathoracic versus intra-abdominal pathology given location of the epigastrium.  EKG revealed no evidence of ischemia or infarction likewise troponin negative x2 given marked discomfort with epigastric palpation CT scan of the abdomen pelvis was performed to evaluate both for possibility of worsening of the patient's known thoracic aneurysm and possible dissection versus other potential intra-abdominal pathology.  CT revealed gastric pneumatosis with associated portal venous gas concerning for his infarction of the stomach.  Patient given IV Cipro and Flagyl and discussed with Dr. Dahlia Byes general surgeon on-call who will evaluate the patient.      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   MEDICATIONS GIVEN DURING THIS VISIT:  Medications  ondansetron (ZOFRAN) injection 4 mg (4 mg Intravenous Not Given 11/21/17 0701)  ciprofloxacin (CIPRO) IVPB 400 mg (400 mg Intravenous New Bag/Given 11/21/17 0706)  metroNIDAZOLE (FLAGYL) IVPB 500 mg (has no administration in time range)  morphine 2 MG/ML injection 2 mg (2 mg Intravenous Given 11/21/17 0147)  ondansetron (ZOFRAN) injection 4 mg (4 mg Intravenous Given 11/21/17 0146)  iopamidol (ISOVUE-370) 76 % injection 100 mL (100 mLs Intravenous Contrast Given 11/21/17 0313)  LORazepam (ATIVAN) injection 1 mg (1  mg Intravenous Given 11/21/17 0344)  ondansetron (ZOFRAN-ODT) disintegrating tablet 4 mg (4 mg Oral Given 11/21/17 7893)     ED Discharge Orders    None       Note:  This document was prepared using Dragon  voice recognition software and may include unintentional dictation errors.    Gregor Hams, MD 11/21/17 2322

## 2017-11-21 NOTE — Progress Notes (Signed)
Advanced care plan.  Purpose of the Encounter: CODE STATUS  Parties in Attendance: Timmie Foerster Wife, and patient  Patient's Decision Capacity: Not intact  Subjective/Patient's story: Patient 77 year old with history of coronary artery disease hypertension BPH Alzheimer's dementia presenting with abdominal pain noted to have gastric wall infarct   Objective/Medical story Discussed with the patient's wife regarding his desires for resuscitation she states that in the past patient sometimes has stated that he wants to be a DNR sometimes he stated that he wants to be a full code.  For now we will leave him full code.  Once he is more awake we can discuss with him regarding his wishes   Goals of care determination:  Full code   CODE STATUS: full Code   Time spent discussing advanced care planning: 16 minutes

## 2017-11-21 NOTE — ED Notes (Signed)
Pt up out of bed urinating on floor. Pt with soft bowel movement in pants. Pt with rn assist x3 to redirect to toilet, cleanse body and place brief. Pt assisted back to bed. Ena Dawley, rn in room for ultrasound guided iv.

## 2017-11-21 NOTE — ED Notes (Signed)
Pt assisted into bed and to ct scan.

## 2017-11-21 NOTE — ED Notes (Signed)
Pt agitated, refuses to get in bed and go to ct. md notified. Order for ativan received.

## 2017-11-21 NOTE — ED Notes (Signed)
Warm pack applied to infiltrated iv site. Pt sleeping. Ena Dawley, rn to attempt ultrasound guided iv insertion.

## 2017-11-22 ENCOUNTER — Inpatient Hospital Stay: Payer: Medicare HMO

## 2017-11-22 DIAGNOSIS — K55059 Acute (reversible) ischemia of intestine, part and extent unspecified: Secondary | ICD-10-CM

## 2017-11-22 LAB — CBC
HCT: 45.9 % (ref 40.0–52.0)
Hemoglobin: 15.6 g/dL (ref 13.0–18.0)
MCH: 30.6 pg (ref 26.0–34.0)
MCHC: 34 g/dL (ref 32.0–36.0)
MCV: 90 fL (ref 80.0–100.0)
Platelets: 118 10*3/uL — ABNORMAL LOW (ref 150–440)
RBC: 5.1 MIL/uL (ref 4.40–5.90)
RDW: 13.7 % (ref 11.5–14.5)
WBC: 20.2 10*3/uL — ABNORMAL HIGH (ref 3.8–10.6)

## 2017-11-22 LAB — BASIC METABOLIC PANEL
Anion gap: 7 (ref 5–15)
BUN: 24 mg/dL — ABNORMAL HIGH (ref 8–23)
CO2: 25 mmol/L (ref 22–32)
Calcium: 8.1 mg/dL — ABNORMAL LOW (ref 8.9–10.3)
Chloride: 107 mmol/L (ref 98–111)
Creatinine, Ser: 1.65 mg/dL — ABNORMAL HIGH (ref 0.61–1.24)
GFR calc Af Amer: 45 mL/min — ABNORMAL LOW (ref >60)
GFR calc non Af Amer: 38 mL/min — ABNORMAL LOW (ref >60)
Glucose, Bld: 141 mg/dL — ABNORMAL HIGH (ref 70–99)
Potassium: 4.1 mmol/L (ref 3.5–5.1)
Sodium: 139 mmol/L (ref 135–145)

## 2017-11-22 LAB — ECHOCARDIOGRAM COMPLETE
Height: 71 in
Weight: 3354.52 oz

## 2017-11-22 LAB — LACTIC ACID, PLASMA
Lactic Acid, Venous: 3.4 mmol/L (ref 0.5–1.9)
Lactic Acid, Venous: 3.6 mmol/L (ref 0.5–1.9)

## 2017-11-22 IMAGING — US US RENAL
1 series · 14 of 25 positions shown · non-contrast
Comparison: CT abdomen and pelvis of [DATE]

CLINICAL DATA: Acute renal failure

EXAM:
RENAL / URINARY TRACT ULTRASOUND COMPLETE

[Series 1: us renal · 14 of 46 slices shown]
[im 1/46]
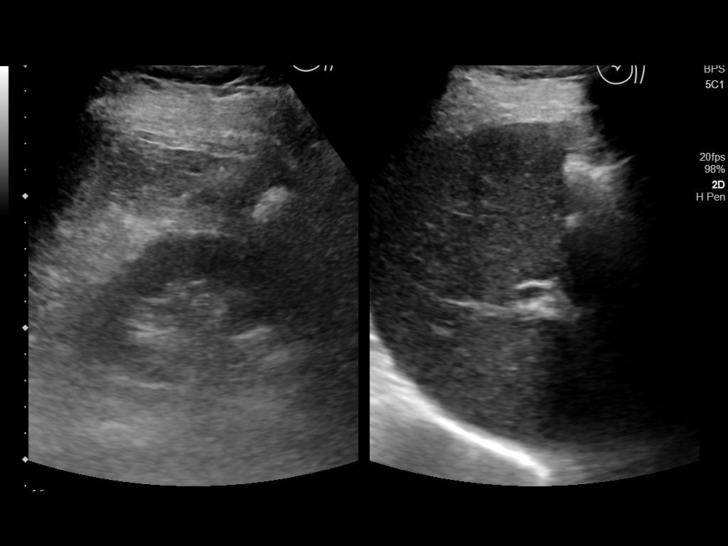
[im 4/46]
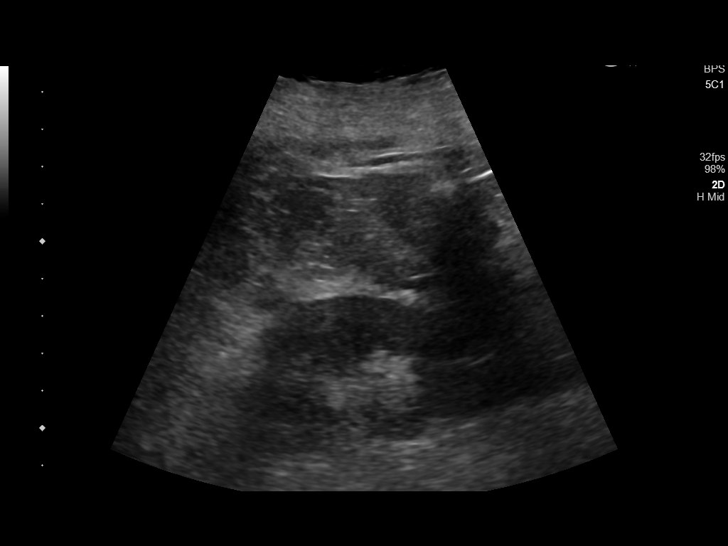
[im 8/46]
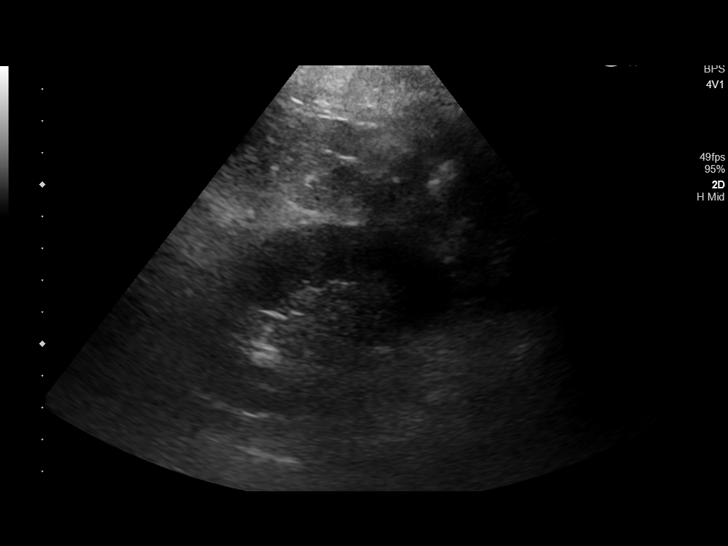
[im 12/46]
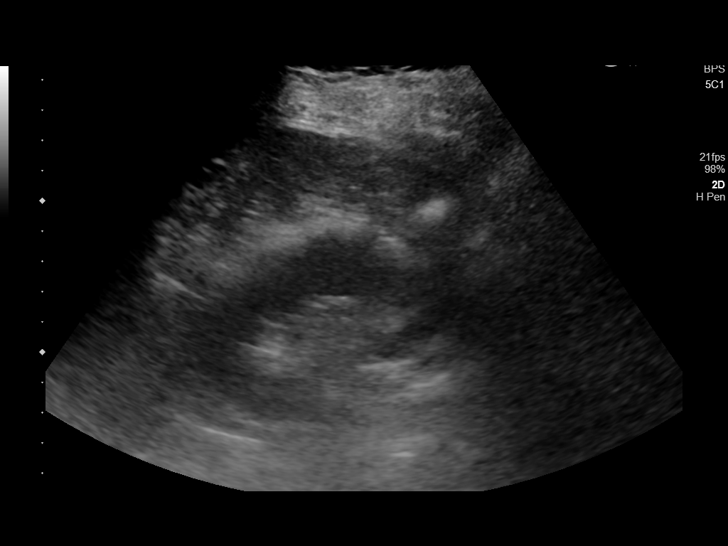
[im 16/46]
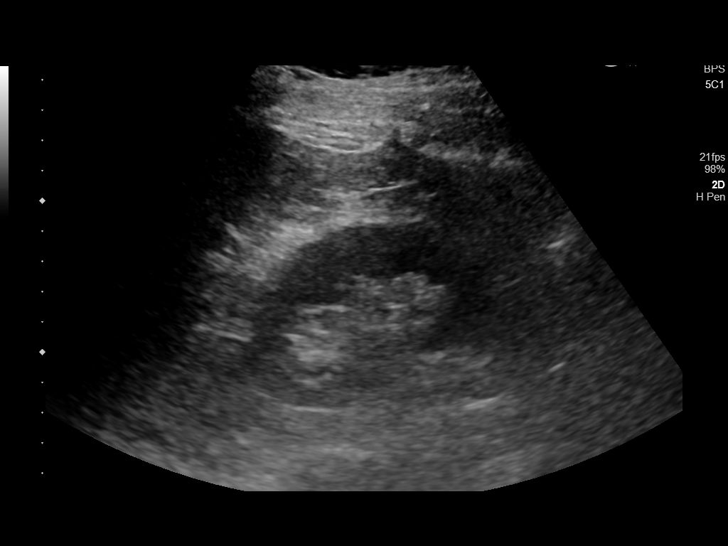
[im 17/46]
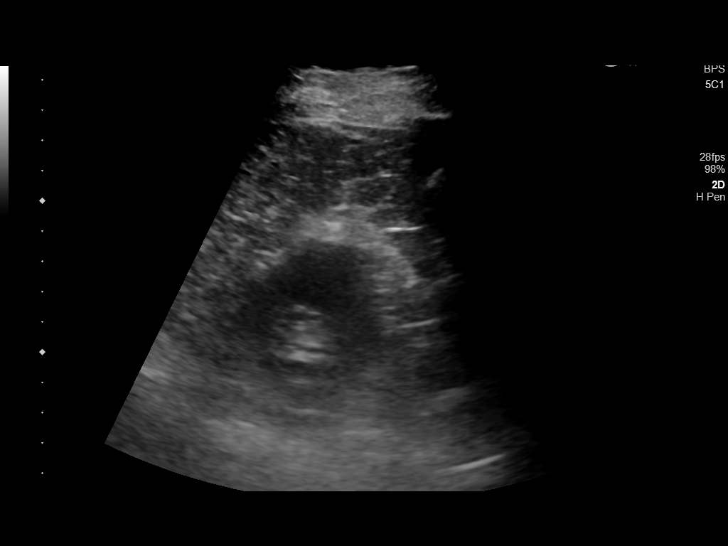
[im 21/46]
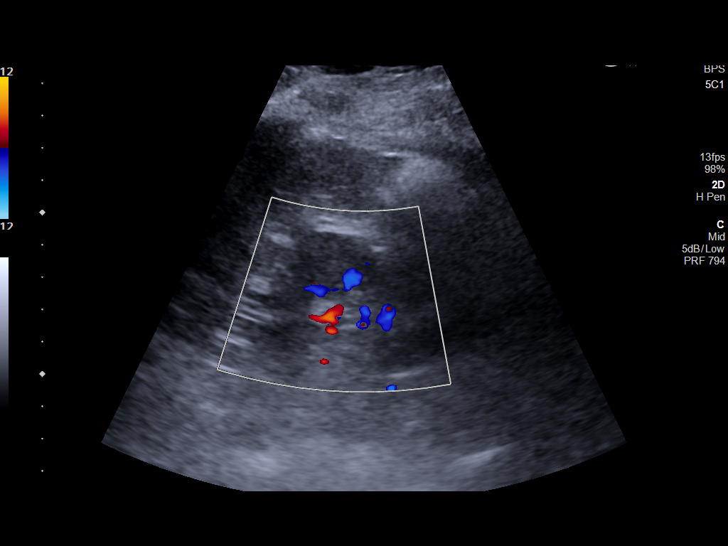
[im 25/46]
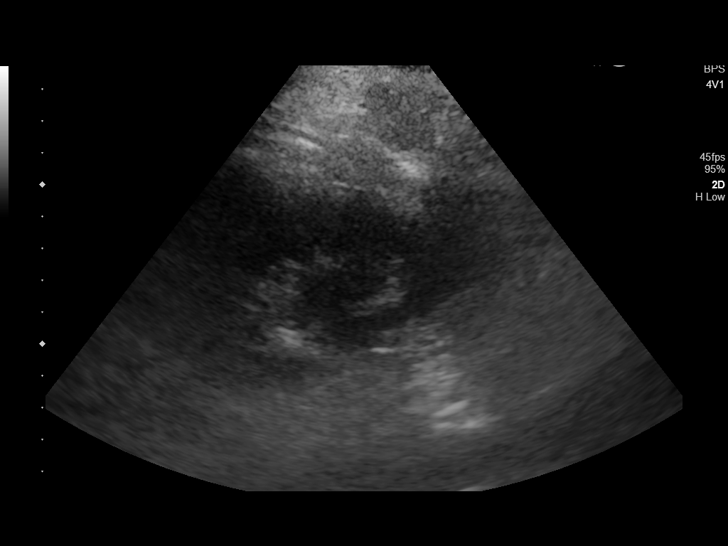
[im 29/46]
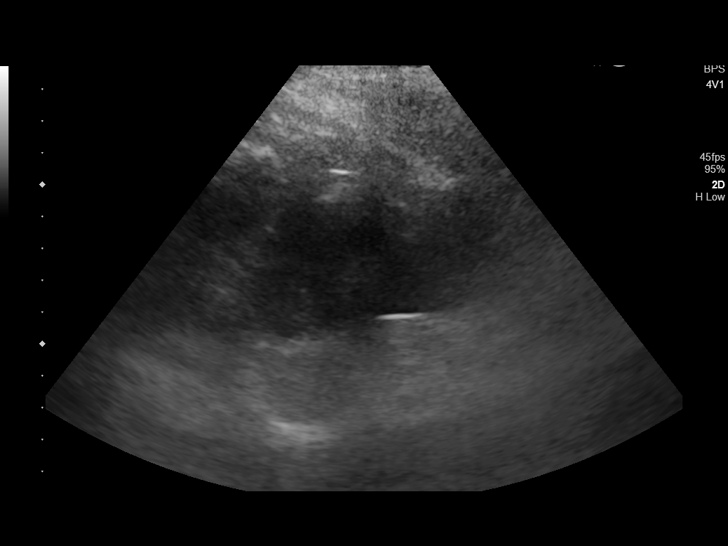
[im 31/46]
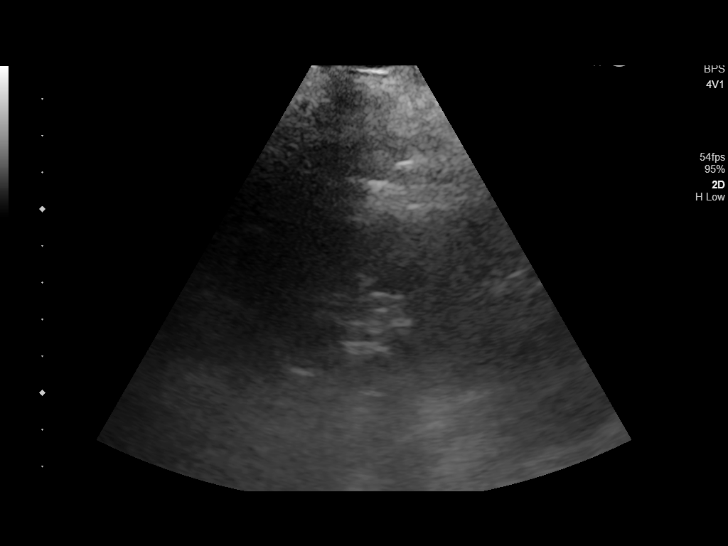
[im 34/46]
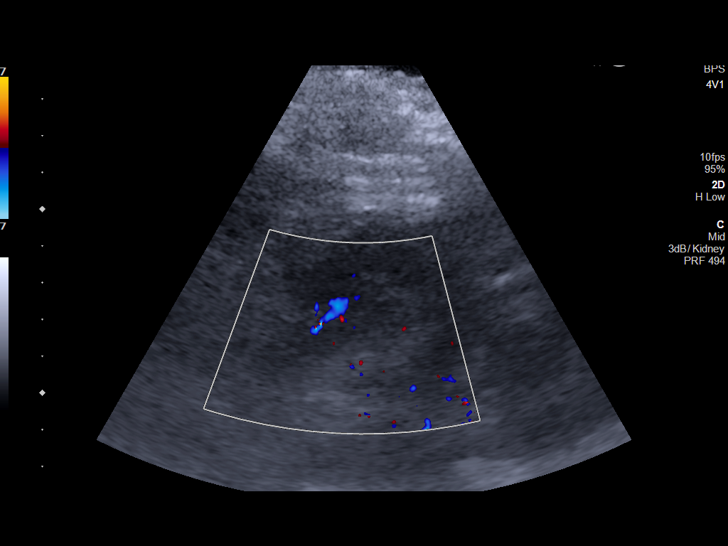
[im 38/46]
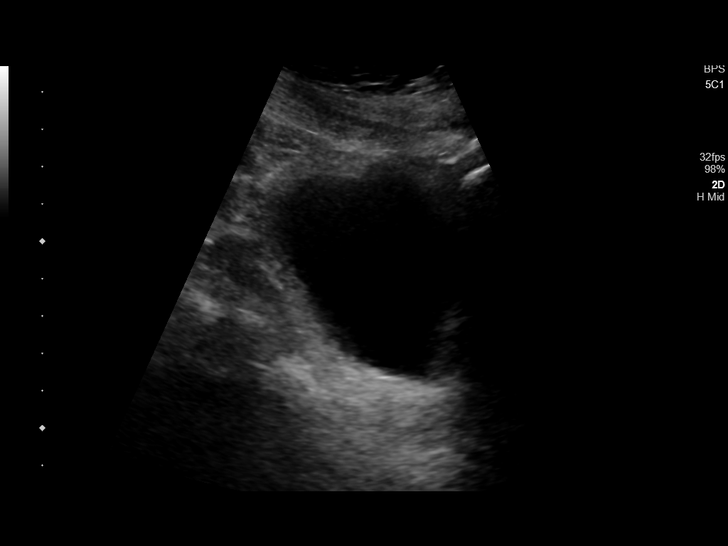
[im 42/46]
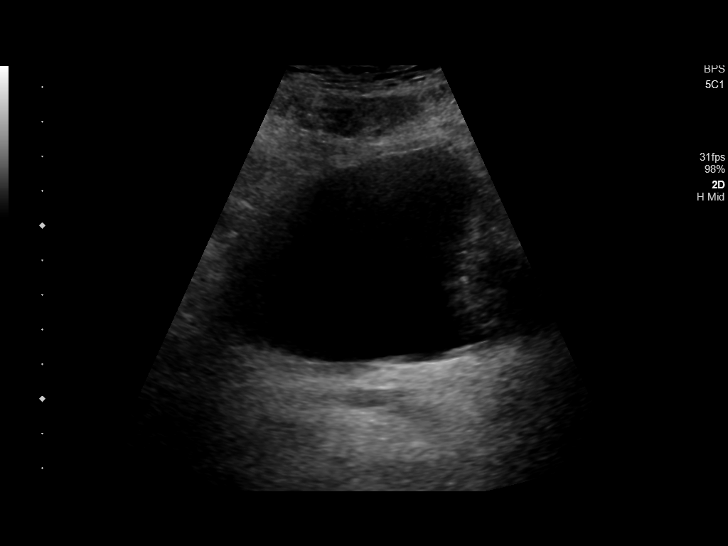
[im 46/46]
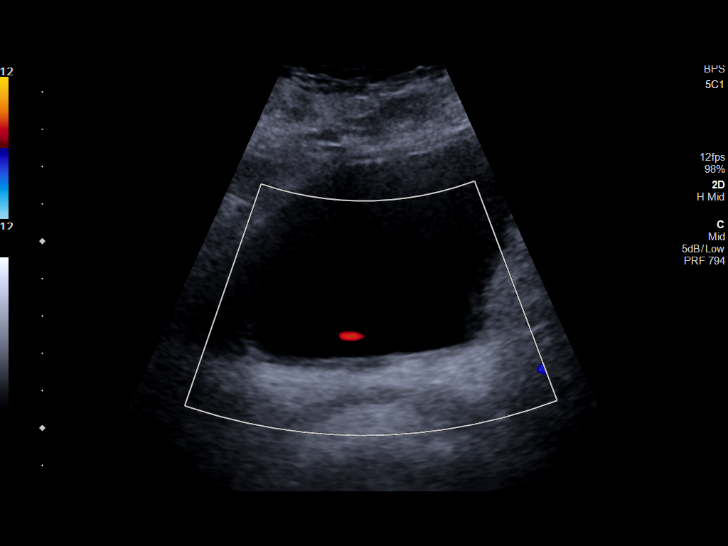

[14 of 25 positions shown; findings below may reference images not displayed]

FINDINGS: Right Kidney:

Length: 8.9 cm.. No hydronephrosis is seen. The renal parenchyma is
somewhat echogenic consistent with chronic renal medical disease.

Left Kidney:

Length: 9.1 cm.. No hydronephrosis is noted. The renal parenchyma is
echogenic consistent with chronic renal medical disease.

Bladder:

Urinary bladder is unremarkable.
IMPRESSION: 1. No hydronephrosis.
2. Echogenic renal parenchyma consistent with chronic renal medical
disease.

## 2017-11-22 IMAGING — DX DG ABDOMEN 1V
2 series · 2 of 2 positions shown · non-contrast
Comparison: [DATE].  CT [DATE].

CLINICAL DATA: Perforated gastric ulcer. Epigastric pain and
vomiting.

EXAM:
ABDOMEN - 1 VIEW

[abdomen supine (1 of 2)]
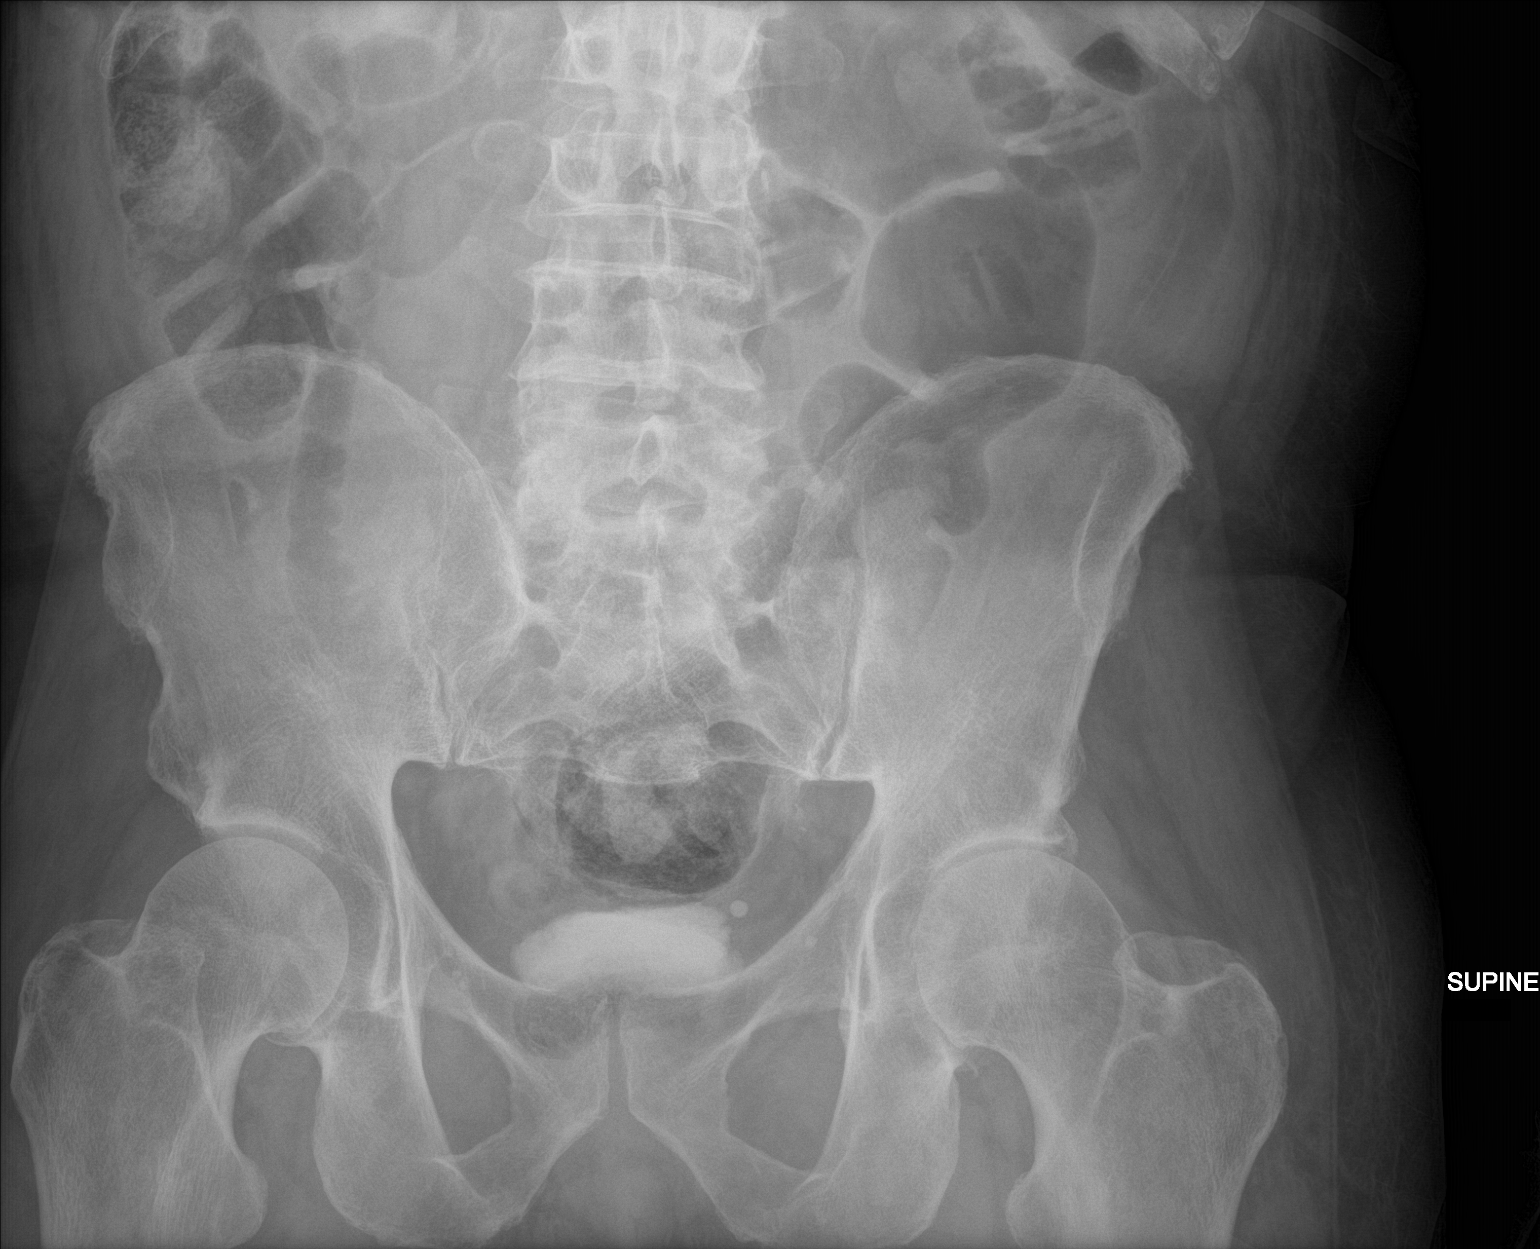

[abdomen supine (2 of 2)]
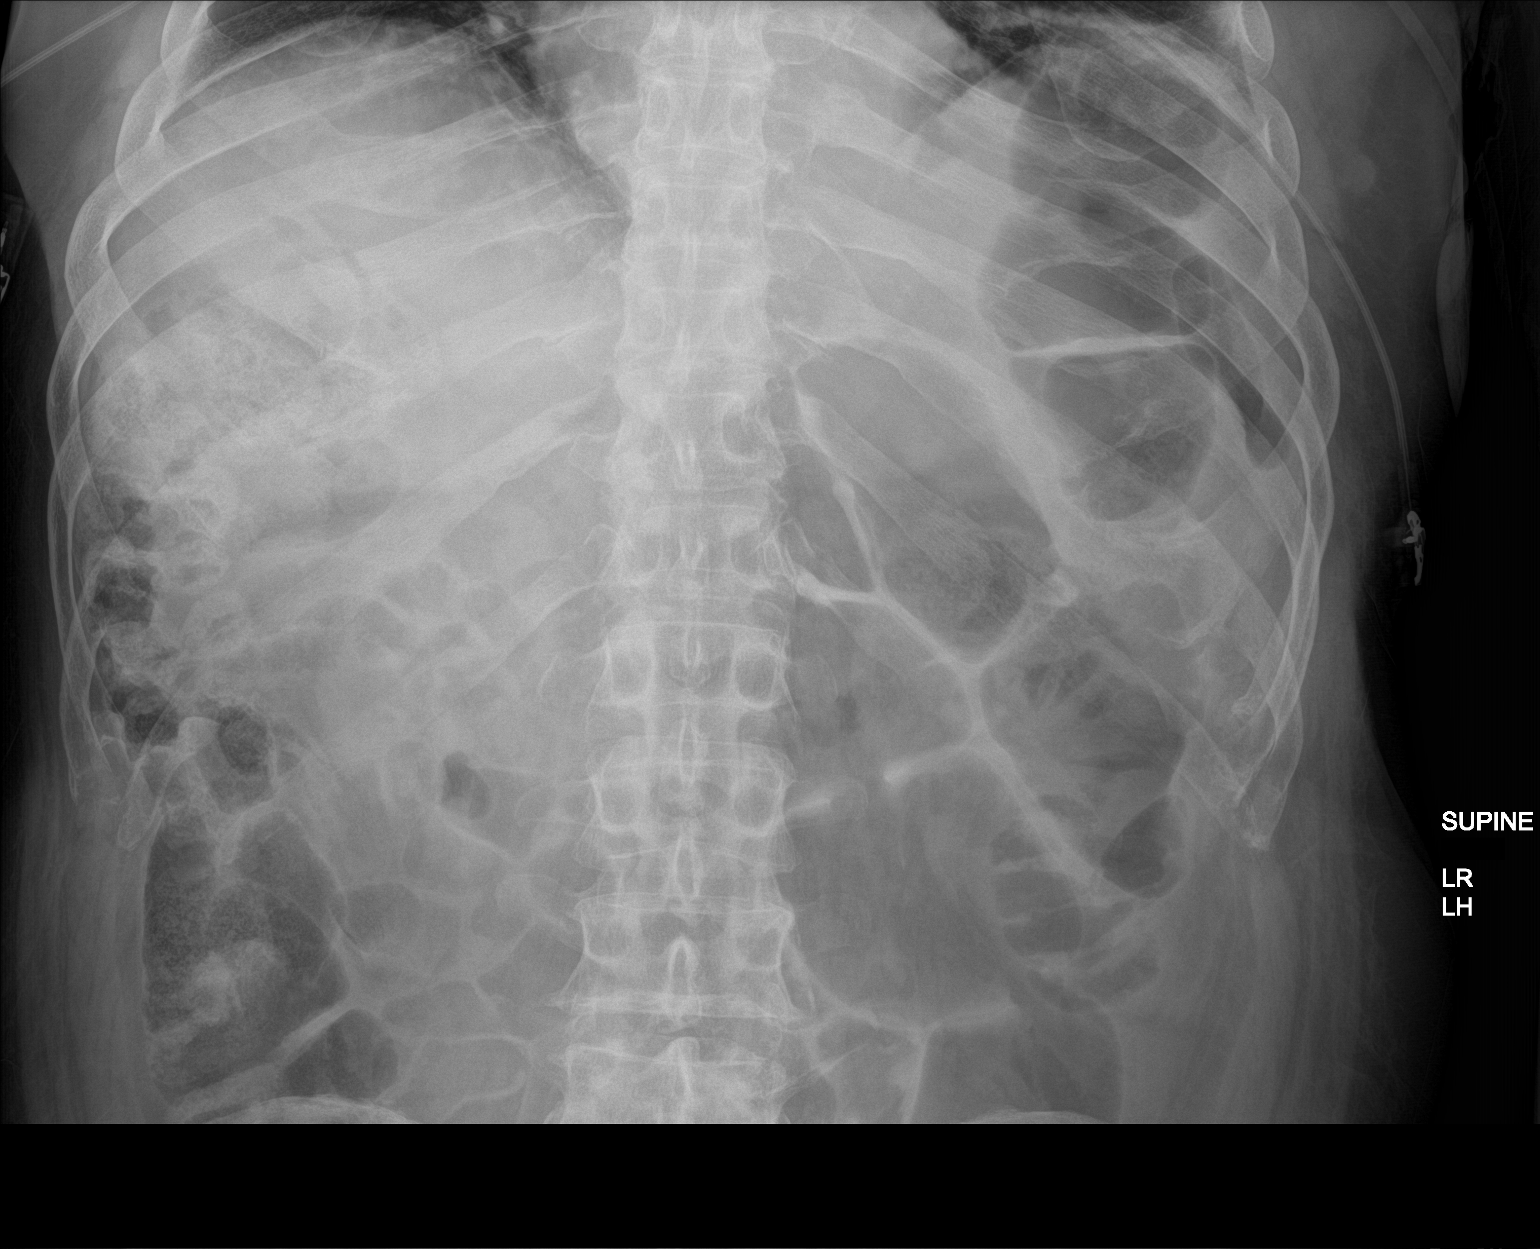

[2 of 2 positions shown; findings below may reference images not displayed]

FINDINGS: Contrast noted in the bladder. Vascular calcifications in the
pelvis. Air-filled loops of mildly prominent small and large bowel
noted suggesting adynamic ileus. No free air identified. Previously
it air within the portal venous system less well identified on
today's exam P
IMPRESSION: 1. Mild prominence of small and large bowel suggesting mild adynamic
ileus.

2. Previously identified air in the portal venous system less well
identified on today's exam. Reference is made to prior CT report of
[DATE].

## 2017-11-22 MED ORDER — MORPHINE SULFATE (PF) 2 MG/ML IV SOLN: 2.0000 mg | INTRAVENOUS | Status: DC | PRN

## 2017-11-22 MED ORDER — MORPHINE SULFATE (PF) 4 MG/ML IV SOLN: 4.0000 mg | INTRAVENOUS | Status: DC | PRN

## 2017-11-22 MED ORDER — MORPHINE SULFATE (PF) 2 MG/ML IV SOLN
2.0000 mg | INTRAVENOUS | Status: DC | PRN
  Administered 2017-11-22 – 2017-11-23 (×3): 2 mg via INTRAVENOUS
  Filled 2017-11-22 (×3): qty 1

## 2017-11-22 MED ORDER — MORPHINE SULFATE (PF) 2 MG/ML IV SOLN
1.0000 mg | INTRAVENOUS | Status: DC | PRN
  Administered 2017-11-23: 1 mg via INTRAVENOUS
  Filled 2017-11-22: qty 1

## 2017-11-22 MED ORDER — LORAZEPAM 2 MG/ML IJ SOLN
1.0000 mg | Freq: Once | INTRAMUSCULAR | Status: AC
  Administered 2017-11-22: 1 mg via INTRAVENOUS
  Filled 2017-11-22: qty 1

## 2017-11-22 MED ORDER — SODIUM CHLORIDE 0.9 % IV SOLN
1.0000 g | Freq: Two times a day (BID) | INTRAVENOUS | Status: DC
  Filled 2017-11-22 (×2): qty 1

## 2017-11-22 MED ORDER — MORPHINE SULFATE (PF) 2 MG/ML IV SOLN: 1.0000 mg | INTRAVENOUS | Status: DC | PRN

## 2017-11-22 MED ORDER — SODIUM CHLORIDE 0.9 % IV SOLN
1.0000 g | Freq: Two times a day (BID) | INTRAVENOUS | Status: DC
  Administered 2017-11-22 – 2017-11-28 (×12): 1 g via INTRAVENOUS
  Filled 2017-11-22 (×13): qty 1

## 2017-11-22 MED ORDER — PANTOPRAZOLE SODIUM 40 MG IV SOLR
40.0000 mg | Freq: Two times a day (BID) | INTRAVENOUS | Status: DC
  Administered 2017-11-22 – 2017-11-27 (×11): 40 mg via INTRAVENOUS
  Filled 2017-11-22 (×11): qty 40

## 2017-11-22 MED ORDER — HALOPERIDOL LACTATE 5 MG/ML IJ SOLN
5.0000 mg | Freq: Four times a day (QID) | INTRAMUSCULAR | Status: DC | PRN
  Administered 2017-11-23 – 2017-11-27 (×4): 5 mg via INTRAVENOUS
  Filled 2017-11-22 (×5): qty 1

## 2017-11-22 NOTE — Consult Note (Signed)
[facility]  CONSULT NOTE    Date: 11/22/2017                  Patient Name:  Alex [name]  MRN: [id number]  DOB: 12/15/1940  Age / Sex: 77 y.o., male         PCP: Patient, No Pcp Per                 Service Requesting Consult: Dr. Margaretmary Eddy                 Reason for Consult: Acute renal failure on chronic kidney disease stage III            History of Present Illness: Alex [name] is a 77 y.o. black male with Alzheimer's dementia, hyeprtension, BPH, coronary artery disease, hyperlipidemia, who was admitted to [facility] on 11/21/2017 for Pneumatosis of intestines [K63.89]  Wife at bedside who gives history. Patient unable to give history due to dementia and lorazepam administration.   Patient presented with abdominal pain. Found to have air in bowel. No acute indication for surgery. Conservative management.   Patient's wife states that he is being worked up for chronic kidney disease at the [place]. [facility]. He has urinary incontinence.    Medications: Outpatient medications: Medications Prior to Admission  Medication Sig Dispense Refill Last Dose  . amLODipine (NORVASC) 5 MG tablet Take 5 mg by mouth daily.   Unknown at Unknown  . aspirin EC 81 MG tablet Take 81 mg by mouth daily.   Unknown at Unknown  . finasteride (PROSCAR) 5 MG tablet Take 1 tablet (5 mg total) by mouth daily. 30 tablet 0 Unknown at Unknown  . furosemide (LASIX) 40 MG tablet Take 40 mg by mouth daily.  0 Unknown at Unknown  . metoprolol succinate (TOPROL-XL) 25 MG 24 hr tablet Take 25 mg by mouth daily. Take with or immediately following a meal.    Unknown at Unknown  . pravastatin (PRAVACHOL) 80 MG tablet Take 80 mg by mouth daily.   Unknown at Unknown  . rivastigmine (EXELON) 6 MG capsule Take 6 mg by mouth 2 (two) times daily.   Unknown at Unknown  . cloNIDine (CATAPRES) 0.1 MG tablet Take 1 tablet (0.1 mg total) by mouth 2 (two) times daily as needed.  (Patient not taking: Reported on 11/21/2017) 20 tablet 0 Unknown at Unknown time  . donepezil (ARICEPT) 10 MG tablet Take 1 tablet (10 mg total) by mouth at bedtime. (Patient not taking: Reported on 08/15/2016) 30 tablet 0 Unknown at Unknown time  . doxycycline (VIBRAMYCIN) 50 MG capsule Take 2 capsules (100 mg total) by mouth 2 (two) times daily. (Patient not taking: Reported on 11/05/2017) 20 capsule 0 Unknown at Unknown time  . HYDROcodone-acetaminophen (NORCO) 5-325 MG tablet Take 1 tablet by mouth every 6 (six) hours as needed for moderate pain. (Patient not taking: Reported on 11/05/2017) 15 tablet 0 Unknown at Unknown time  . ibuprofen (ADVIL,MOTRIN) 800 MG tablet Take 1 tablet (800 mg total) by mouth every 8 (eight) hours as needed for moderate pain. (Patient not taking: Reported on 11/21/2017) 15 tablet 0 Unknown at Unknown time    Current medications: Current Facility-Administered Medications  Medication Dose Route Frequency Provider Last Rate Last Dose  . dextrose 5 % in lactated ringers infusion   Intravenous Continuous Tylene Fantasia, PA-C 125 mL/hr at 11/22/17 1149    . enoxaparin (LOVENOX) injection 40 mg  40 mg Subcutaneous Q24H Tylene Fantasia, [place]   40 mg at 11/21/17 2139  . hydrALAZINE (APRESOLINE) injection 10 mg  10 mg Intravenous Q6H PRN Dustin Flock, MD      . meropenem (MERREM) 1 g in sodium chloride 0.9 % 100 mL IVPB  1 g Intravenous Q12H Ell[place], Abby K, RPH      . metoprolol tartrate (LOPRESSOR) injection 5 mg  5 mg Intravenous Q6H PRN Tylene Fantasia, PA-C      . morphine 2 MG/ML injection 1 mg  1 mg Intravenous Q4H PRN Gouru, Aruna, MD      . morphine 2 MG/ML injection 2 mg  2 mg Intravenous Q4H PRN Gouru, Aruna, MD      . morphine 4 MG/ML injection 4 mg  4 mg Intravenous Q4H PRN Gouru, Aruna, MD      . ondansetron (ZOFRAN) injection 4 mg  4 mg Intravenous Once Tylene Fantasia, PA-C      . ondansetron (ZOFRAN-ODT) disintegrating tablet 4 mg  4 mg Oral Q6H PRN  Tylene Fantasia, PA-C       Or  . ondansetron [facility]) injection 4 mg  4 mg Intravenous Q6H PRN Tylene Fantasia, PA-C      . pantoprazole (PROTONIX) injection 40 mg  40 mg Intravenous Q12H Nicholes Mango, MD          Allergies: Allergies  Allergen Reactions  . Penicillins Other (See Comments)    Unknown reaction, childhood  Has patient had a PCN reaction causing immediate rash, facial/tongue/throat swelling, SOB or lightheadedness with hypotension: Yes Has patient had a PCN reaction causing severe rash involving mucus membranes or skin necrosis: No Has patient had a PCN reaction that required hospitalization: No Has patient had a PCN reaction occurring within the last 10 years: No If all of the above answers are "NO", then may proceed with Cephalosporin use.       Past Medical History: Past Medical History:  Diagnosis Date  . Alzheimer's dementia   . BPH (benign prostatic hyperplasia)   . CAD (coronary artery disease)   . Dementia   . Hypertension      Past Surgical History: Past Surgical History:  Procedure Laterality Date  . CORONARY ANGIOPLASTY WITH STENT PLACEMENT    . HERNIA REPAIR       Family History: Family History  Problem Relation Age of Onset  . Hypertension Mother      Social History: Social History   Socioeconomic History  . Marital status: Married    Spouse name: Not on file  . Number of children: Not on file  . Years of education: Not on file  . Highest education level: Not on file  Occupational History  . Not on file  Social Needs  . Financial resource strain: Not on file  . Food insecurity:    Worry: Not on file    Inability: Not on file  . Transportation needs:    Medical: Not on file    Non-medical: Not on file  Tobacco Use  . Smoking status: Never Smoker  . Smokeless tobacco: Never Used  Substance and Sexual Activity  . Alcohol use: Yes    Comment: rarely  . Drug use: No  . Sexual activity: Not on file  Lifestyle  .  Physical activity:    Days per week: Not on file    Minutes per session: Not on file  . Stress: Not on file  Relationships  . Social connections:  Talks on phone: Not on file    Gets together: Not on file    Attends religious service: Not on file    Active member of club or organization: Not on file    Attends meetings of clubs or organizations: Not on file    Relationship status: Not on file  . Intimate partner violence:    Fear of current or ex partner: Not on file    Emotionally abused: Not on file    Physically abused: Not on file    Forced sexual activity: Not on file  Other Topics Concern  . Not on file  Social History Narrative  . Not on file     Review of Systems: Review of Systems  Unable to perform ROS: Dementia    Vital Signs: Blood pressure (!) 131/104, pulse 88, temperature 98.2 F (36.8 C), temperature source Oral, resp. rate 18, height 5\' 11"  (1.803 m), weight 95.1 kg, SpO2 98 %.  Weight trends: Filed Weights   11/21/17 1003  Weight: 95.1 kg    Physical Exam: General: NAD, laying in be  Head: Normocephalic, atraumatic. Moist oral mucosal membranes  Eyes: Anicteric, PERRL  Neck: Supple, trachea midline  Lungs:  Clear to auscultation  Heart: Regular rate and rhythm  Abdomen:  Soft, nontender,   Extremities:  no peripheral edema.  Neurologic: Nonfocal, moving all four extremities  Skin: No lesions         Lab results: Basic Metabolic Panel: Recent Labs  Lab 11/21/17 0223 11/22/17 0356  NA 143 139  K 3.6 4.1  CL 113* 107  CO2 22 25  GLUCOSE 90 141*  BUN 11 24*  CREATININE 1.07 1.65*  CALCIUM 6.6* 8.1*    Liver Function Tests: No results for input(s): AST, ALT, ALKPHOS, BILITOT, PROT, ALBUMIN in the last 168 hours. No results for input(s): LIPASE, AMYLASE in the last 168 hours. No results for input(s): AMMONIA in the last 168 hours.  CBC: Recent Labs  Lab 11/21/17 0142 11/22/17 0356  WBC 14.5* 20.2*  HGB 14.3 15.6  HCT 41.9  45.9  MCV 90.6 90.0  PLT 109* 118*    Cardiac Enzymes: Recent Labs  Lab 11/21/17 0223 11/21/17 0631  TROPONINI <0.03 <0.03    BNP: Invalid input(s): POCBNP  CBG: No results for input(s): GLUCAP in the last 168 hours.  Microbiology: Results for orders placed or performed during the hospital encounter of 08/15/16  Urine culture     Status: None   Collection Time: 08/15/16  7:19 PM  Result Value Ref Range Status   Specimen Description URINE, RANDOM  Final   Special Requests NONE  Final   Culture   Final    NO GROWTH Performed at [facility], [place] [address] [place], [place] [address]    Report Status 08/17/2016 FINAL  Final    Coagulation Studies: No results for input(s): LABPROT, INR in the last 72 hours.  Urinalysis: No results for input(s): COLORURINE, LABSPEC, PHURINE, GLUCOSEU, HGBUR, BILIRUBINUR, KETONESUR, PROTEINUR, UROBILINOGEN, NITRITE, LEUKOCYTESUR in the last 72 hours.  Invalid input(s): APPERANCEUR    Imaging: Ct Abdomen Pelvis Wo Contrast  Result Date: 11/21/2017 CLINICAL DATA:  77 y/o  M; nausea, vomiting, abdominal pain. EXAM: CT CHEST, ABDOMEN AND PELVIS WITHOUT CONTRAST TECHNIQUE: Multidetector CT imaging of the chest, abdomen and pelvis was performed following the standard protocol without IV contrast. COMPARISON:  08/15/2016 CT angiogram of the chest. FINDINGS: CT CHEST FINDINGS Cardiovascular: Stable 4.4 cm ascending aortic aneurysm. Mild aortic  and severe coronary artery calcific atherosclerosis. Normal heart size. No pericardial effusion. Mediastinum/Nodes: No enlarged mediastinal, hilar, or axillary lymph nodes. Thyroid gland, trachea, and esophagus demonstrate no significant findings. Lungs/Pleura: Peribronchial thickening and scattered mucous plugging within the lower lobes. No consolidation. No pleural effusion. No pneumothorax. Musculoskeletal: No chest wall mass or suspicious bone lesions identified. CT ABDOMEN PELVIS FINDINGS  Hepatobiliary: Branching air within the liver which appears to be predominantly peripheral, suspicious for portal venous gas. No gallstones, gallbladder wall thickening, or biliary dilatation. Pancreas: Unremarkable. No pancreatic ductal dilatation or surrounding inflammatory changes. Spleen: Normal in size without focal abnormality. Adrenals/Urinary Tract: Adrenal glands are unremarkable. Kidneys are normal, without renal calculi, focal lesion, or hydronephrosis. Bladder is unremarkable. Contrast is present within the renal collecting system. Stomach/Bowel: Diffuse wall thickening of the stomach and anti dependent pneumatosis in the posterior wall of the stomach body (series 2 image 18-25). Appendix not identified, no pericecal inflammation. No evidence of bowel wall thickening, distention, or inflammatory changes of the small or large bowel. Vascular/Lymphatic: Aortic atherosclerosis. No enlarged abdominal or pelvic lymph nodes. Reproductive: Prostatic calcifications. Mild prostate enlargement extending into the floor of bladder. Other: No abdominal wall hernia or abnormality. No abdominopelvic ascites. Musculoskeletal: Multilevel degenerative changes of the spine greatest at L5-S1 with there is severe loss of intervertebral disc space height. No acute osseous abnormality is evident. IMPRESSION: 1. Liver portal venous gas and anti dependent pneumatosis within the posterior wall of the gastric body. 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 2. Stable 4.4 cm ascending aortic aneurysm. Recommend annual imaging followup by CTA or MRA. This recommendation follows 2010 ACCF/AHA/AATS/ACR/ASA/SCA/SCAI/SIR/STS/SVM Guidelines for the Diagnosis and Management of Patients with Thoracic Aortic Disease. Circulation. 2010; 121: H885-O277. 3. Coronary and aortic calcific atherosclerosis. 4. Peribronchial thickening and mucous plugging in the lung bases which may represent bronchitis or possibly aspiration given distribution. These results  were called by telephone at the time of interpretation on 11/21/2017 at 6:49 am to Dr. Marjean Donna , who verbally acknowledged these results. Electronically Signed   By: Kristine Garbe M.D.   On: 11/21/2017 06:53   Dg Abd 1 View  Result Date: 11/22/2017 CLINICAL DATA:  Perforated gastric ulcer. Epigastric pain and vomiting. EXAM: ABDOMEN - 1 VIEW COMPARISON:  11/21/2017.  CT 11/21/2017. FINDINGS: Contrast noted in the bladder. Vascular calcifications in the pelvis. Air-filled loops of mildly prominent small and large bowel noted suggesting adynamic ileus. No free air identified. Previously it air within the portal venous system less well identified on today's exam P IMPRESSION: 1. Mild prominence of small and large bowel suggesting mild adynamic ileus. 2. Previously identified air in the portal venous system less well identified on today's exam. Reference is made to prior CT report of 11/21/2017. Electronically Signed   By: Marcello Moores  Register   On: 11/22/2017 09:33   Ct Chest Wo Contrast  Result Date: 11/21/2017 CLINICAL DATA:  77 y/o  M; nausea, vomiting, abdominal pain. EXAM: CT CHEST, ABDOMEN AND PELVIS WITHOUT CONTRAST TECHNIQUE: Multidetector CT imaging of the chest, abdomen and pelvis was performed following the standard protocol without IV contrast. COMPARISON:  08/15/2016 CT angiogram of the chest. FINDINGS: CT CHEST FINDINGS Cardiovascular: Stable 4.4 cm ascending aortic aneurysm. Mild aortic and severe coronary artery calcific atherosclerosis. Normal heart size. No pericardial effusion. Mediastinum/Nodes: No enlarged mediastinal, hilar, or axillary lymph nodes. Thyroid gland, trachea, and esophagus demonstrate no significant findings. Lungs/Pleura: Peribronchial thickening and scattered mucous plugging within the lower lobes. No consolidation. No pleural effusion. No pneumothorax. Musculoskeletal:  No chest wall mass or suspicious bone lesions identified. CT ABDOMEN PELVIS FINDINGS  Hepatobiliary: Branching air within the liver which appears to be predominantly peripheral, suspicious for portal venous gas. No gallstones, gallbladder wall thickening, or biliary dilatation. Pancreas: Unremarkable. No pancreatic ductal dilatation or surrounding inflammatory changes. Spleen: Normal in size without focal abnormality. Adrenals/Urinary Tract: Adrenal glands are unremarkable. Kidneys are normal, without renal calculi, focal lesion, or hydronephrosis. Bladder is unremarkable. Contrast is present within the renal collecting system. Stomach/Bowel: Diffuse wall thickening of the stomach and anti dependent pneumatosis in the posterior wall of the stomach body (series 2 image 18-25). Appendix not identified, no pericecal inflammation. No evidence of bowel wall thickening, distention, or inflammatory changes of the small or large bowel. Vascular/Lymphatic: Aortic atherosclerosis. No enlarged abdominal or pelvic lymph nodes. Reproductive: Prostatic calcifications. Mild prostate enlargement extending into the floor of bladder. Other: No abdominal wall hernia or abnormality. No abdominopelvic ascites. Musculoskeletal: Multilevel degenerative changes of the spine greatest at L5-S1 with there is severe loss of intervertebral disc space height. No acute osseous abnormality is evident. IMPRESSION: 1. Liver portal venous gas and anti dependent pneumatosis within the posterior wall of the gastric body. 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 2. Stable 4.4 cm ascending aortic aneurysm. Recommend annual imaging followup by CTA or MRA. This recommendation follows 2010 ACCF/AHA/AATS/ACR/ASA/SCA/SCAI/SIR/STS/SVM Guidelines for the Diagnosis and Management of Patients with Thoracic Aortic Disease. Circulation. 2010; 121: N562-Z308. 3. Coronary and aortic calcific atherosclerosis. 4. Peribronchial thickening and mucous plugging in the lung bases which may represent bronchitis or possibly aspiration given distribution. These results  were called by telephone at the time of interpretation on 11/21/2017 at 6:49 am to Dr. Marjean Donna , who verbally acknowledged these results. Electronically Signed   By: Kristine Garbe M.D.   On: 11/21/2017 06:53   Dg Chest Port 1 View  Result Date: 11/21/2017 CLINICAL DATA:  Chest and abdominal pain EXAM: PORTABLE CHEST 1 VIEW COMPARISON:  11/05/2017 FINDINGS: Bibasilar scarring. Heart is normal size. No acute confluent airspace opacity or effusion. No acute bony abnormality. IMPRESSION: Bibasilar scarring.  No active disease. Electronically Signed   By: Rolm Baptise M.D.   On: 11/21/2017 00:30   Dg Loyce Dys Tube Plc W/fl W/rad  Result Date: 11/21/2017 CLINICAL DATA:  Fluoroscopically assisted placement of a nasogastric tube. EXAM: NASO G TUBE PLACEMENT WITH FL AND WITH RAD CONTRAST:  No contrast was administered. FLUOROSCOPY TIME:  Fluoroscopy Time:  1 minutes Radiation Exposure Index (if provided by the fluoroscopic device): 11.5 mGy Number of Acquired Spot Images: 1 screen save COMPARISON:  Noncontrast abdominopelvic CT scan of today's date. FINDINGS: The anticipated procedure was explained to Mr. Roylance. He voiced his willingness to proceed. The 14 French nasogastric tube was placed under fluoroscopic guidance via the left nostril. The tip of the tube cane to lie in the proximal gastric body with the proximal port in the gastric cardia. The patient tolerated the procedure reasonably well. IMPRESSION: Successful placement of a nasogastric tube with fluoroscopic guidance. No immediate complication. Electronically Signed   By: David  Martinique M.D.   On: 11/21/2017 13:55      Assessment & Plan: Mr. Erroll Wilbourne is a 77 y.o. black male with Alzheimer's dementia, hyeprtension, BPH, coronary artery disease, hyperlipidemia, who was admitted to [facility] on 11/21/2017 for Pneumatosis of intestines [K63.89]  1. Acute renal failure 2. Chronic kidney disease stage III 3. Hypertension:  4.  Hematuria  Impression Baseline creatinine of 1.2, GFR of 55 on 04/27/17.  -  Check Renal ultrasound - Continue IV fluids  LOS: 1 Nneka Blanda 8/27/20193:06 PM

## 2017-11-22 NOTE — Progress Notes (Signed)
Pharmacy Antibiotic Note  Alex [name] is a 77 y.o. male admitted on 11/21/2017 with intra-abdominal infection. Patient has been on ciprofloxacin and metronidazole since 8/26. Concern today with patient's increasing WBC. Surgery recommended switching to broad spectrum antibiotic. Pharmacy has been consulted for meropenem dosing.  Plan: Start meropenem 1 g IV q12h with first dose today @ 1800. Dose adjusted for patient's renal function Will continue to monitor for any changes in renal function for additional dose adjustments  Height: 5\' 11"  (180.3 cm) Weight: 209 lb 10.5 oz (95.1 kg) IBW/kg (Calculated) : 75.3  Temp (24hrs), Avg:98.3 F (36.8 C), Min:98 F (36.7 C), Max:98.7 F (37.1 C)  Recent Labs  Lab 11/21/17 0142 11/21/17 0223 11/22/17 0356 11/22/17 1129  WBC 14.5*  --  20.2*  --   CREATININE  --  1.07 1.65*  --   LATICACIDVEN  --   --   --  3.4*    Estimated Creatinine Clearance: 44.1 mL/min (A) (by C-G formula based on SCr of 1.65 mg/dL (H)).    Allergies  Allergen Reactions  . Penicillins Other (See Comments)    Unknown reaction, childhood  Has patient had a PCN reaction causing immediate rash, facial/tongue/throat swelling, SOB or lightheadedness with hypotension: Yes Has patient had a PCN reaction causing severe rash involving mucus membranes or skin necrosis: No Has patient had a PCN reaction that required hospitalization: No Has patient had a PCN reaction occurring within the last 10 years: No If all of the above answers are "NO", then may proceed with Cephalosporin use.     Antimicrobials this admission: Ciprofloxacin 8/26 >> 8/27 Flagyl 8/26 >> 8/27  Dose adjustments this admission:   Microbiology results:   Thank you for allowing pharmacy to be a part of this patient's care.  Tawnya Crook, PharmD Pharmacy Resident  11/22/2017 2:47 PM

## 2017-11-22 NOTE — Progress Notes (Signed)
Pt had a DG ABD one view xray scheduled for this AM ordered by Dr Dahlia Byes. Per MD plan of care is to see Pt this AM and if needed to replace NGT by radiology. Per Radiology will wait for MD to see Pt if NGT needed, this AM that way Pt does not have to be brought down two times.

## 2017-11-22 NOTE — Progress Notes (Signed)
[facility] [place] NAME: Alex [name]    MR#:  [id number]  DATE OF BIRTH:  1941-01-27  SUBJECTIVE:  CHIEF COMPLAINT: Patient has chronic history of Alzheimer's dementia and unable to get any history.  Wife who is a healthcare power of attorney and other significant family members at bedside.  REVIEW OF SYSTEMS:  Review of system unobtainable as the patient has chronic baseline dementia  DRUG ALLERGIES:   Allergies  Allergen Reactions  . Penicillins Other (See Comments)    Unknown reaction, childhood  Has patient had a PCN reaction causing immediate rash, facial/tongue/throat swelling, SOB or lightheadedness with hypotension: Yes Has patient had a PCN reaction causing severe rash involving mucus membranes or skin necrosis: No Has patient had a PCN reaction that required hospitalization: No Has patient had a PCN reaction occurring within the last 10 years: No If all of the above answers are "NO", then may proceed with Cephalosporin use.     VITALS:  Blood pressure (!) 131/104, pulse 88, temperature 98.2 F (36.8 C), temperature source Oral, resp. rate 18, height 5\' 11"  (1.803 m), weight 95.1 kg, SpO2 98 %.  PHYSICAL EXAMINATION:  GENERAL:  77 y.o.-year-old patient lying in the bed with no acute distress.  EYES: Pupils equal, round, reactive to light and accommodation. No scleral icterus. Extraocular muscles intact.  HEENT: Head atraumatic, normocephalic. Oropharynx and nasopharynx clear.  NECK:  Supple, no jugular venous distention. No thyroid enlargement, no tenderness.  LUNGS: Normal breath sounds bilaterally, no wheezing, rales,rhonchi or crepitation. No use of accessory muscles of respiration.  CARDIOVASCULAR: S1, S2 normal. No murmurs, rubs, or gallops.  ABDOMEN: Soft, some tenderness on palpation, bowel sounds present.  EXTREMITIES: No pedal edema, cyanosis, or clubbing.  NEUROLOGIC: Arousable but disoriented gait not  checked.  PSYCHIATRIC: The patient is arousable but disoriented SKIN: No obvious rash, lesion, or ulcer.    LABORATORY PANEL:   CBC Recent Labs  Lab 11/22/17 0356  WBC 20.2*  HGB 15.6  HCT 45.9  PLT 118*   ------------------------------------------------------------------------------------------------------------------  Chemistries  Recent Labs  Lab 11/22/17 0356  NA 139  K 4.1  CL 107  CO2 25  GLUCOSE 141*  BUN 24*  CREATININE 1.65*  CALCIUM 8.1*   ------------------------------------------------------------------------------------------------------------------  Cardiac Enzymes Recent Labs  Lab 11/21/17 0631  TROPONINI <0.03   ------------------------------------------------------------------------------------------------------------------  RADIOLOGY:  Ct Abdomen Pelvis Wo Contrast  Result Date: 11/21/2017 CLINICAL DATA:  77 y/o  M; nausea, vomiting, abdominal pain. EXAM: CT CHEST, ABDOMEN AND PELVIS WITHOUT CONTRAST TECHNIQUE: Multidetector CT imaging of the chest, abdomen and pelvis was performed following the standard protocol without IV contrast. COMPARISON:  08/15/2016 CT angiogram of the chest. FINDINGS: CT CHEST FINDINGS Cardiovascular: Stable 4.4 cm ascending aortic aneurysm. Mild aortic and severe coronary artery calcific atherosclerosis. Normal heart size. No pericardial effusion. Mediastinum/Nodes: No enlarged mediastinal, hilar, or axillary lymph nodes. Thyroid gland, trachea, and esophagus demonstrate no significant findings. Lungs/Pleura: Peribronchial thickening and scattered mucous plugging within the lower lobes. No consolidation. No pleural effusion. No pneumothorax. Musculoskeletal: No chest wall mass or suspicious bone lesions identified. CT ABDOMEN PELVIS FINDINGS Hepatobiliary: Branching air within the liver which appears to be predominantly peripheral, suspicious for portal venous gas. No gallstones, gallbladder wall thickening, or biliary  dilatation. Pancreas: Unremarkable. No pancreatic ductal dilatation or surrounding inflammatory changes. Spleen: Normal in size without focal abnormality. Adrenals/Urinary Tract: Adrenal glands are unremarkable. Kidneys are normal, without renal calculi, focal lesion, or hydronephrosis.  Bladder is unremarkable. Contrast is present within the renal collecting system. Stomach/Bowel: Diffuse wall thickening of the stomach and anti dependent pneumatosis in the posterior wall of the stomach body (series 2 image 18-25). Appendix not identified, no pericecal inflammation. No evidence of bowel wall thickening, distention, or inflammatory changes of the small or large bowel. Vascular/Lymphatic: Aortic atherosclerosis. No enlarged abdominal or pelvic lymph nodes. Reproductive: Prostatic calcifications. Mild prostate enlargement extending into the floor of bladder. Other: No abdominal wall hernia or abnormality. No abdominopelvic ascites. Musculoskeletal: Multilevel degenerative changes of the spine greatest at L5-S1 with there is severe loss of intervertebral disc space height. No acute osseous abnormality is evident. IMPRESSION: 1. Liver portal venous gas and anti dependent pneumatosis within the posterior wall of the gastric body. 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 2. Stable 4.4 cm ascending aortic aneurysm. Recommend annual imaging followup by CTA or MRA. This recommendation follows 2010 ACCF/AHA/AATS/ACR/ASA/SCA/SCAI/SIR/STS/SVM Guidelines for the Diagnosis and Management of Patients with Thoracic Aortic Disease. Circulation. 2010; 121: M086-P619. 3. Coronary and aortic calcific atherosclerosis. 4. Peribronchial thickening and mucous plugging in the lung bases which may represent bronchitis or possibly aspiration given distribution. These results were called by telephone at the time of interpretation on 11/21/2017 at 6:49 am to Dr. Marjean Donna , who verbally acknowledged these results. Electronically Signed   By: Kristine Garbe M.D.   On: 11/21/2017 06:53   Dg Abd 1 View  Result Date: 11/22/2017 CLINICAL DATA:  Perforated gastric ulcer. Epigastric pain and vomiting. EXAM: ABDOMEN - 1 VIEW COMPARISON:  11/21/2017.  CT 11/21/2017. FINDINGS: Contrast noted in the bladder. Vascular calcifications in the pelvis. Air-filled loops of mildly prominent small and large bowel noted suggesting adynamic ileus. No free air identified. Previously it air within the portal venous system less well identified on today's exam P IMPRESSION: 1. Mild prominence of small and large bowel suggesting mild adynamic ileus. 2. Previously identified air in the portal venous system less well identified on today's exam. Reference is made to prior CT report of 11/21/2017. Electronically Signed   By: Marcello Moores  Register   On: 11/22/2017 09:33   Ct Chest Wo Contrast  Result Date: 11/21/2017 CLINICAL DATA:  77 y/o  M; nausea, vomiting, abdominal pain. EXAM: CT CHEST, ABDOMEN AND PELVIS WITHOUT CONTRAST TECHNIQUE: Multidetector CT imaging of the chest, abdomen and pelvis was performed following the standard protocol without IV contrast. COMPARISON:  08/15/2016 CT angiogram of the chest. FINDINGS: CT CHEST FINDINGS Cardiovascular: Stable 4.4 cm ascending aortic aneurysm. Mild aortic and severe coronary artery calcific atherosclerosis. Normal heart size. No pericardial effusion. Mediastinum/Nodes: No enlarged mediastinal, hilar, or axillary lymph nodes. Thyroid gland, trachea, and esophagus demonstrate no significant findings. Lungs/Pleura: Peribronchial thickening and scattered mucous plugging within the lower lobes. No consolidation. No pleural effusion. No pneumothorax. Musculoskeletal: No chest wall mass or suspicious bone lesions identified. CT ABDOMEN PELVIS FINDINGS Hepatobiliary: Branching air within the liver which appears to be predominantly peripheral, suspicious for portal venous gas. No gallstones, gallbladder wall thickening, or biliary  dilatation. Pancreas: Unremarkable. No pancreatic ductal dilatation or surrounding inflammatory changes. Spleen: Normal in size without focal abnormality. Adrenals/Urinary Tract: Adrenal glands are unremarkable. Kidneys are normal, without renal calculi, focal lesion, or hydronephrosis. Bladder is unremarkable. Contrast is present within the renal collecting system. Stomach/Bowel: Diffuse wall thickening of the stomach and anti dependent pneumatosis in the posterior wall of the stomach body (series 2 image 18-25). Appendix not identified, no pericecal inflammation. No evidence of bowel wall thickening, distention, or inflammatory  changes of the small or large bowel. Vascular/Lymphatic: Aortic atherosclerosis. No enlarged abdominal or pelvic lymph nodes. Reproductive: Prostatic calcifications. Mild prostate enlargement extending into the floor of bladder. Other: No abdominal wall hernia or abnormality. No abdominopelvic ascites. Musculoskeletal: Multilevel degenerative changes of the spine greatest at L5-S1 with there is severe loss of intervertebral disc space height. No acute osseous abnormality is evident. IMPRESSION: 1. Liver portal venous gas and anti dependent pneumatosis within the posterior wall of the gastric body. 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 2. Stable 4.4 cm ascending aortic aneurysm. Recommend annual imaging followup by CTA or MRA. This recommendation follows 2010 ACCF/AHA/AATS/ACR/ASA/SCA/SCAI/SIR/STS/SVM Guidelines for the Diagnosis and Management of Patients with Thoracic Aortic Disease. Circulation. 2010; 121: G818-H631. 3. Coronary and aortic calcific atherosclerosis. 4. Peribronchial thickening and mucous plugging in the lung bases which may represent bronchitis or possibly aspiration given distribution. These results were called by telephone at the time of interpretation on 11/21/2017 at 6:49 am to Dr. Marjean Donna , who verbally acknowledged these results. Electronically Signed   By: Kristine Garbe M.D.   On: 11/21/2017 06:53   Dg Chest Port 1 View  Result Date: 11/21/2017 CLINICAL DATA:  Chest and abdominal pain EXAM: PORTABLE CHEST 1 VIEW COMPARISON:  11/05/2017 FINDINGS: Bibasilar scarring. Heart is normal size. No acute confluent airspace opacity or effusion. No acute bony abnormality. IMPRESSION: Bibasilar scarring.  No active disease. Electronically Signed   By: Rolm Baptise M.D.   On: 11/21/2017 00:30   Dg Loyce Dys Tube Plc W/fl W/rad  Result Date: 11/21/2017 CLINICAL DATA:  Fluoroscopically assisted placement of a nasogastric tube. EXAM: NASO G TUBE PLACEMENT WITH FL AND WITH RAD CONTRAST:  No contrast was administered. FLUOROSCOPY TIME:  Fluoroscopy Time:  1 minutes Radiation Exposure Index (if provided by the fluoroscopic device): 11.5 mGy Number of Acquired Spot Images: 1 screen save COMPARISON:  Noncontrast abdominopelvic CT scan of today's date. FINDINGS: The anticipated procedure was explained to Mr. Skelley. He voiced his willingness to proceed. The 14 French nasogastric tube was placed under fluoroscopic guidance via the left nostril. The tip of the tube cane to lie in the proximal gastric body with the proximal port in the gastric cardia. The patient tolerated the procedure reasonably well. IMPRESSION: Successful placement of a nasogastric tube with fluoroscopic guidance. No immediate complication. Electronically Signed   By: David  Martinique M.D.   On: 11/21/2017 13:55    EKG:   Orders placed or performed during the hospital encounter of 11/21/17  . ED EKG  . ED EKG  . EKG 12-Lead  . 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 Echocardiogram with a 75% ejection fraction.  Cavity size was normal severe concentric hypertrophy is present systolic function was vigorous Broaden the antibiotic coverage to meropenem given the leukocytosis Cipro and Flagyl discontinued  #.Acute kidney injury Hydrate with IV fluids, avoid nephrotoxins Nephrology consult placed discussed with Dr. Juleen China Monitor renal function creatinine 1.65  BUN 24   #  Essential hypertension Due to patient being n.p.o. we will use IV hydralazine as needed  #.  Coronary artery disease with no chest pain Cardiac enzymes negative EKG unrevealing Hold aspirin  other cardiac meds for now until able to take oral p.o.  #.  Alzheimer's dementia-chronic  #  BPH resume Proscar when able to take oral      All the records are reviewed and case discussed with Care Management/Social Workerr. Management plans discussed with the patient, family and they are in agreement.  CODE STATUS:   TOTAL TIME TAKING CARE OF THIS PATIENT: 36  minutes.   POSSIBLE D/C IN ? DAYS, DEPENDING ON CLINICAL CONDITION.  Note: This dictation was prepared with Dragon dictation along with smaller phrase technology. Any transcriptional errors that result from this process are unintentional.   Nicholes Mango M.D on 11/22/2017 at 3:04 PM  Between 7am to 6pm - Pager - [phone] After 6pm go to [email or web address] - password [email or web address] [facility]  Office  [phone]  CC: Primary care physician; Patient, No Pcp Per

## 2017-11-22 NOTE — Progress Notes (Signed)
Spoke with Dr. Benjie Karvonen regarding patient's increasing agitation.  New orders will be entered.

## 2017-11-22 NOTE — Progress Notes (Signed)
CRITICAL VALUE STICKER  CRITICAL VALUE: lactic acid 3.6  RECEIVER (on-site recipient of call): Hoyle Sauer, RN  [place] NOTIFIED: 11/22/17 1525  MD NOTIFIED: Otho Ket, PA  TIME OF NOTIFICATION: 11/19/17 1530  RESPONSE: no new orders

## 2017-11-22 NOTE — Care Management (Signed)
Per Levada Dy [phone] with [facility] patient is open to them with PT services. Patient and spouse currently unavailable. RNCM will confirm with them that we can resume services once discharged if they agree.

## 2017-11-22 NOTE — Progress Notes (Signed)
CRITICAL VALUE STICKER  CRITICAL VALUE:  Lactic acid 3.4  RECEIVER (on-site recipient of call): Hoyle Sauer, [place] NOTIFIED: 11/22/17 1221  MD NOTIFIED: Edison Simon, PA  TIME OF NOTIFICATION: 11/22/17 1225  RESPONSE: no new orders

## 2017-11-22 NOTE — Progress Notes (Addendum)
SURGICAL PROGRESS NOTE (cpt: [phone])   Patient seen and examined as described below with surgical PA-C, Ardell Isaacs.  Assessment/Plan: (ICD-10's: K71.069) 77 y.o. male with gastric infarct of unclear etiology and, while resolved abdominal pain and N/V (despite having self-discontinued his NG tube overnight), increased leukocytosis and Cr with somewhat elevated lactic acid, complicated by comorbidities including advanced chronological age, HTN, CAD, alzheimer's dementia (today expresses belief he is in a grocery store), and BPH with overall guarded prognosis.   - IV fluids increased  - echocardiogram reviewed as part of embolic workup  - continue PPI infusion with antibiotics, broadened to meropenem   - follow-up repeat WBC, Cr, and lactic acid, monitor abdominal exam and bowel function  - medical management of comorbidities per medical team  - DVT prophylaxis  I have personally reviewed the patient's chart, evaluated/examined the patient, proposed the recommended management, and discussed these recommendations with the patient and his family to their expressed satisfaction as well as with patient's RN.  -- Marilynne Drivers. Rosana Hoes, MD, [place]: [facility] - Partnering for exceptional care. Office: [phone]  [facility] Progress Note     Subjective: Patient resting comfortably in bed this morning with family at bedside. He does not endorse any abdominal pain, nausea, or emesis. Overnight, he pulled out his NG Tube. No fevers, chills, CP, SOB.   Objective: Vital signs in last 24 hours: Temp:  [98 F (36.7 C)-98.7 F (37.1 C)] 98.7 F (37.1 C) (08/27 0630) Pulse Rate:  [62-82] 82 (08/27 0630) Resp:  [17-20] 20 (08/27 0630) BP: (142-152)/(82-86) 144/86 (08/27 0630) SpO2:  [95 %-100 %] 100 % (08/27 0630) Last BM Date: 11/21/17  Intake/Output from previous day: 08/26 0701 - 08/27 0700 In: 1002.9 [I.V.:703.4; IV  Piggyback:299.5] Out: 204 [Urine:52; Emesis/NG output:150; Stool:2] Intake/Output this shift: Total I/O In: 1649.7 [I.V.:1311.2; IV Piggyback:338.5] Out: -   PE: Gen:  Alert, NAD, pleasant Card:  Regular rate and rhythm, pedal pulses 2+ BL Pulm:  Normal effort, clear to auscultation bilaterally Abd: Soft, non-tender, non-distended, bowel sounds decreased in all 4 quadrants. No rebound, guarding, or rigidity.  MSK: No peripheral edema, no calf tenderness Skin: warm and dry, no rashes  Psych: A&Ox3   Lab Results:  Recent Labs    11/21/17 0142 11/22/17 0356  WBC 14.5* 20.2*  HGB 14.3 15.6  HCT 41.9 45.9  PLT 109* 118*   BMET Recent Labs    11/21/17 0223 11/22/17 0356  NA 143 139  K 3.6 4.1  CL 113* 107  CO2 22 25  GLUCOSE 90 141*  BUN 11 24*  CREATININE 1.07 1.65*  CALCIUM 6.6* 8.1*   PT/INR No results for input(s): LABPROT, INR in the last 72 hours. CMP     Component Value Date/Time   NA 139 11/22/2017 0356   K 4.1 11/22/2017 0356   CL 107 11/22/2017 0356   CO2 25 11/22/2017 0356   GLUCOSE 141 (H) 11/22/2017 0356   BUN 24 (H) 11/22/2017 0356   CREATININE 1.65 (H) 11/22/2017 0356   CALCIUM 8.1 (L) 11/22/2017 0356   PROT 7.4 04/27/2017 2035   ALBUMIN 4.2 04/27/2017 2035   AST 24 04/27/2017 2035   ALT 13 (L) 04/27/2017 2035   ALKPHOS 67 04/27/2017 2035   BILITOT 0.6 04/27/2017 2035   GFRNONAA 38 (L) 11/22/2017 0356   GFRAA 45 (L) 11/22/2017 0356   Lipase  No results found for: LIPASE     Studies/Results: Ct Abdomen Pelvis Wo  Contrast  Result Date: 11/21/2017 CLINICAL DATA:  77 y/o  M; nausea, vomiting, abdominal pain. EXAM: CT CHEST, ABDOMEN AND PELVIS WITHOUT CONTRAST TECHNIQUE: Multidetector CT imaging of the chest, abdomen and pelvis was performed following the standard protocol without IV contrast. COMPARISON:  08/15/2016 CT angiogram of the chest. FINDINGS: CT CHEST FINDINGS Cardiovascular: Stable 4.4 cm ascending aortic aneurysm. Mild aortic and  severe coronary artery calcific atherosclerosis. Normal heart size. No pericardial effusion. Mediastinum/Nodes: No enlarged mediastinal, hilar, or axillary lymph nodes. Thyroid gland, trachea, and esophagus demonstrate no significant findings. Lungs/Pleura: Peribronchial thickening and scattered mucous plugging within the lower lobes. No consolidation. No pleural effusion. No pneumothorax. Musculoskeletal: No chest wall mass or suspicious bone lesions identified. CT ABDOMEN PELVIS FINDINGS Hepatobiliary: Branching air within the liver which appears to be predominantly peripheral, suspicious for portal venous gas. No gallstones, gallbladder wall thickening, or biliary dilatation. Pancreas: Unremarkable. No pancreatic ductal dilatation or surrounding inflammatory changes. Spleen: Normal in size without focal abnormality. Adrenals/Urinary Tract: Adrenal glands are unremarkable. Kidneys are normal, without renal calculi, focal lesion, or hydronephrosis. Bladder is unremarkable. Contrast is present within the renal collecting system. Stomach/Bowel: Diffuse wall thickening of the stomach and anti dependent pneumatosis in the posterior wall of the stomach body (series 2 image 18-25). Appendix not identified, no pericecal inflammation. No evidence of bowel wall thickening, distention, or inflammatory changes of the small or large bowel. Vascular/Lymphatic: Aortic atherosclerosis. No enlarged abdominal or pelvic lymph nodes. Reproductive: Prostatic calcifications. Mild prostate enlargement extending into the floor of bladder. Other: No abdominal wall hernia or abnormality. No abdominopelvic ascites. Musculoskeletal: Multilevel degenerative changes of the spine greatest at L5-S1 with there is severe loss of intervertebral disc space height. No acute osseous abnormality is evident. IMPRESSION: 1. Liver portal venous gas and anti dependent pneumatosis within the posterior wall of the gastric body. 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  2. Stable 4.4 cm ascending aortic aneurysm. Recommend annual imaging followup by CTA or MRA. This recommendation follows 2010 ACCF/AHA/AATS/ACR/ASA/SCA/SCAI/SIR/STS/SVM Guidelines for the Diagnosis and Management of Patients with Thoracic Aortic Disease. Circulation. 2010; 121: M841-L244. 3. Coronary and aortic calcific atherosclerosis. 4. Peribronchial thickening and mucous plugging in the lung bases which may represent bronchitis or possibly aspiration given distribution. These results were called by telephone at the time of interpretation on 11/21/2017 at 6:49 am to Dr. Marjean Donna , who verbally acknowledged these results. Electronically Signed   By: Kristine Garbe M.D.   On: 11/21/2017 06:53   Dg Abd 1 View  Result Date: 11/22/2017 CLINICAL DATA:  Perforated gastric ulcer. Epigastric pain and vomiting. EXAM: ABDOMEN - 1 VIEW COMPARISON:  11/21/2017.  CT 11/21/2017. FINDINGS: Contrast noted in the bladder. Vascular calcifications in the pelvis. Air-filled loops of mildly prominent small and large bowel noted suggesting adynamic ileus. No free air identified. Previously it air within the portal venous system less well identified on today's exam P IMPRESSION: 1. Mild prominence of small and large bowel suggesting mild adynamic ileus. 2. Previously identified air in the portal venous system less well identified on today's exam. Reference is made to prior CT report of 11/21/2017. Electronically Signed   By: Marcello Moores  Register   On: 11/22/2017 09:33   Ct Chest Wo Contrast  Result Date: 11/21/2017 CLINICAL DATA:  77 y/o  M; nausea, vomiting, abdominal pain. EXAM: CT CHEST, ABDOMEN AND PELVIS WITHOUT CONTRAST TECHNIQUE: Multidetector CT imaging of the chest, abdomen and pelvis was performed following the standard protocol without IV contrast. COMPARISON:  08/15/2016 CT angiogram of  the chest. FINDINGS: CT CHEST FINDINGS Cardiovascular: Stable 4.4 cm ascending aortic aneurysm. Mild aortic and severe  coronary artery calcific atherosclerosis. Normal heart size. No pericardial effusion. Mediastinum/Nodes: No enlarged mediastinal, hilar, or axillary lymph nodes. Thyroid gland, trachea, and esophagus demonstrate no significant findings. Lungs/Pleura: Peribronchial thickening and scattered mucous plugging within the lower lobes. No consolidation. No pleural effusion. No pneumothorax. Musculoskeletal: No chest wall mass or suspicious bone lesions identified. CT ABDOMEN PELVIS FINDINGS Hepatobiliary: Branching air within the liver which appears to be predominantly peripheral, suspicious for portal venous gas. No gallstones, gallbladder wall thickening, or biliary dilatation. Pancreas: Unremarkable. No pancreatic ductal dilatation or surrounding inflammatory changes. Spleen: Normal in size without focal abnormality. Adrenals/Urinary Tract: Adrenal glands are unremarkable. Kidneys are normal, without renal calculi, focal lesion, or hydronephrosis. Bladder is unremarkable. Contrast is present within the renal collecting system. Stomach/Bowel: Diffuse wall thickening of the stomach and anti dependent pneumatosis in the posterior wall of the stomach body (series 2 image 18-25). Appendix not identified, no pericecal inflammation. No evidence of bowel wall thickening, distention, or inflammatory changes of the small or large bowel. Vascular/Lymphatic: Aortic atherosclerosis. No enlarged abdominal or pelvic lymph nodes. Reproductive: Prostatic calcifications. Mild prostate enlargement extending into the floor of bladder. Other: No abdominal wall hernia or abnormality. No abdominopelvic ascites. Musculoskeletal: Multilevel degenerative changes of the spine greatest at L5-S1 with there is severe loss of intervertebral disc space height. No acute osseous abnormality is evident. IMPRESSION: 1. Liver portal venous gas and anti dependent pneumatosis within the posterior wall of the gastric body. 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 2.  Stable 4.4 cm ascending aortic aneurysm. Recommend annual imaging followup by CTA or MRA. This recommendation follows 2010 ACCF/AHA/AATS/ACR/ASA/SCA/SCAI/SIR/STS/SVM Guidelines for the Diagnosis and Management of Patients with Thoracic Aortic Disease. Circulation. 2010; 121: Q259-D638. 3. Coronary and aortic calcific atherosclerosis. 4. Peribronchial thickening and mucous plugging in the lung bases which may represent bronchitis or possibly aspiration given distribution. These results were called by telephone at the time of interpretation on 11/21/2017 at 6:49 am to Dr. Marjean Donna , who verbally acknowledged these results. Electronically Signed   By: Kristine Garbe M.D.   On: 11/21/2017 06:53   Dg Chest Port 1 View  Result Date: 11/21/2017 CLINICAL DATA:  Chest and abdominal pain EXAM: PORTABLE CHEST 1 VIEW COMPARISON:  11/05/2017 FINDINGS: Bibasilar scarring. Heart is normal size. No acute confluent airspace opacity or effusion. No acute bony abnormality. IMPRESSION: Bibasilar scarring.  No active disease. Electronically Signed   By: Rolm Baptise M.D.   On: 11/21/2017 00:30   Dg Loyce Dys Tube Plc W/fl W/rad  Result Date: 11/21/2017 CLINICAL DATA:  Fluoroscopically assisted placement of a nasogastric tube. EXAM: NASO G TUBE PLACEMENT WITH FL AND WITH RAD CONTRAST:  No contrast was administered. FLUOROSCOPY TIME:  Fluoroscopy Time:  1 minutes Radiation Exposure Index (if provided by the fluoroscopic device): 11.5 mGy Number of Acquired Spot Images: 1 screen save COMPARISON:  Noncontrast abdominopelvic CT scan of today's date. FINDINGS: The anticipated procedure was explained to Mr. Jaquith. He voiced his willingness to proceed. The 14 French nasogastric tube was placed under fluoroscopic guidance via the left nostril. The tip of the tube cane to lie in the proximal gastric body with the proximal port in the gastric cardia. The patient tolerated the procedure reasonably well. IMPRESSION: Successful  placement of a nasogastric tube with fluoroscopic guidance. No immediate complication. Electronically Signed   By: David  Martinique M.D.   On: 11/21/2017 13:55  Anti-infectives: Anti-infectives (From admission, onward)   Start     Dose/Rate Route Frequency Ordered Stop   11/21/17 1800  ciprofloxacin (CIPRO) IVPB 400 mg     400 mg 200 mL/hr over 60 Minutes Intravenous Every 12 hours 11/21/17 1012     11/21/17 1400  metroNIDAZOLE (FLAGYL) IVPB 500 mg     500 mg 100 mL/hr over 60 Minutes Intravenous Every 8 hours 11/21/17 1012     11/21/17 0700  ciprofloxacin (CIPRO) IVPB 400 mg     400 mg 200 mL/hr over 60 Minutes Intravenous  Once 11/21/17 0659 11/21/17 0816   11/21/17 0700  metroNIDAZOLE (FLAGYL) IVPB 500 mg     500 mg 100 mL/hr over 60 Minutes Intravenous  Once 11/21/17 0659 11/21/17 5189       Assessment/Plan  Gastric Pneumatosis  - Concern this morning given increase in WBC to 20.4 and increase in BUN to 24 and sCr 1.6. Remains afebrile and abdominal exam is without evidence of peritonitis. Will check lactate and increase IVF to 125 ml/hr and reassess.  - Abdominal XR from this morning shows mild prominence of small and large bowel suggesting mild adynamic ileus and less prominent portal venous air.  - Echocardiogram yesterday showed Normal LVEF, severe LVH and grade 1 diastolic dysfunction - In the interim, he should remain NPO and NG Tube can remain out as he is not vomiting. Continue pain control, IVF, ABx, PPI, SCDs, and continue to reassess.   - Spoke with medicine (Dr. Margaretmary Eddy, MD) will change to broad spectrum antibiotic, likely a carbapenem given penicillin allergy. Family also requested nephrology involvement which she consulted.     LOS: 1 day    Edison Simon , PA-C [facility] 11/22/2017, 11:09 AM [phone] M-F: 7am - 4pm

## 2017-11-23 ENCOUNTER — Inpatient Hospital Stay: Payer: Medicare HMO

## 2017-11-23 DIAGNOSIS — K6389 Other specified diseases of intestine: Secondary | ICD-10-CM

## 2017-11-23 LAB — CBC
HCT: 43.7 % (ref 40.0–52.0)
Hemoglobin: 14.8 g/dL (ref 13.0–18.0)
MCH: 30.9 pg (ref 26.0–34.0)
MCHC: 33.8 g/dL (ref 32.0–36.0)
MCV: 91.2 fL (ref 80.0–100.0)
Platelets: 102 10*3/uL — ABNORMAL LOW (ref 150–440)
RBC: 4.79 MIL/uL (ref 4.40–5.90)
RDW: 14.1 % (ref 11.5–14.5)
WBC: 15.8 10*3/uL — ABNORMAL HIGH (ref 3.8–10.6)

## 2017-11-23 LAB — BASIC METABOLIC PANEL
Anion gap: 9 (ref 5–15)
BUN: 25 mg/dL — ABNORMAL HIGH (ref 8–23)
CO2: 25 mmol/L (ref 22–32)
Calcium: 8.1 mg/dL — ABNORMAL LOW (ref 8.9–10.3)
Chloride: 108 mmol/L (ref 98–111)
Creatinine, Ser: 1.63 mg/dL — ABNORMAL HIGH (ref 0.61–1.24)
GFR calc Af Amer: 45 mL/min — ABNORMAL LOW (ref >60)
GFR calc non Af Amer: 39 mL/min — ABNORMAL LOW (ref >60)
Glucose, Bld: 143 mg/dL — ABNORMAL HIGH (ref 70–99)
Potassium: 4 mmol/L (ref 3.5–5.1)
Sodium: 142 mmol/L (ref 135–145)

## 2017-11-23 IMAGING — RF DG UGI W/ GASTROGRAFIN
3 series · 14 of 14 positions shown · IV contrast (agent unspecified)
Comparison: Portable abdominal radiograph of today at [DATE] a.m..

CLINICAL DATA: Recent gastric perforation. Assess for evidence of a
persistently.

EXAM:
WATER SOLUBLE UPPER GI SERIES
TECHNIQUE: Single-column upper GI series was performed using water soluble
contrast.
CONTRAST:  [DATE] strength Gastrografin was employed.

[Series 2: fluoro_barium 2fps_bw · 0.17mm/px · 4 of 12 frames shown (1 of 2)]
[frame 2/12]
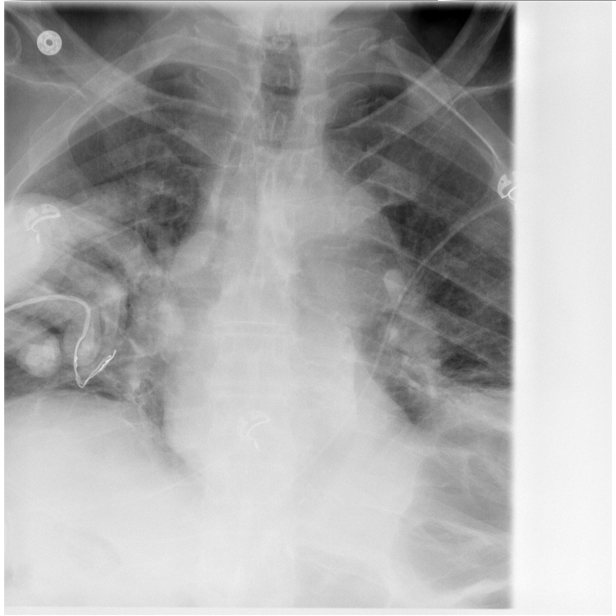
[frame 7/12]
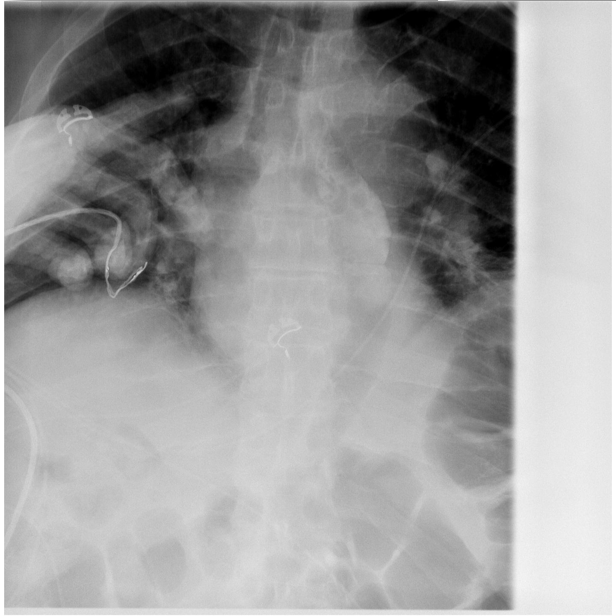
[frame 11/12]
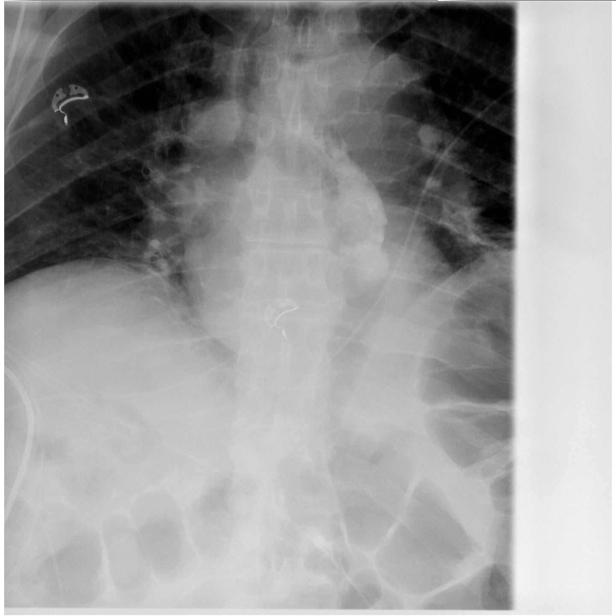
[frame 12/12]
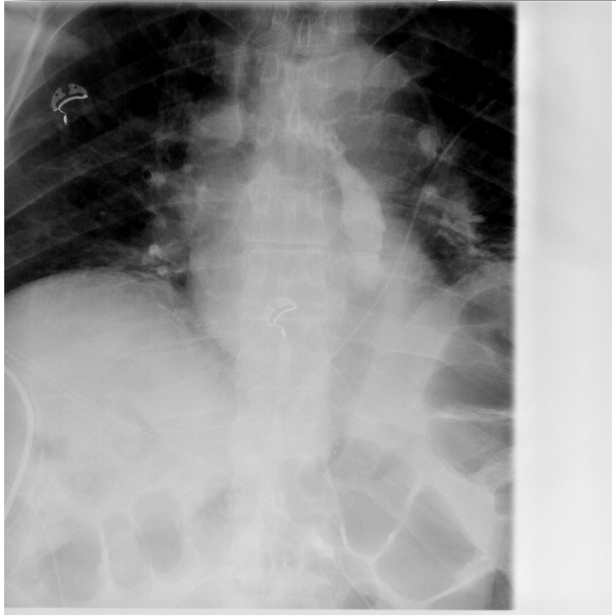

[Series 3: fluoro_barium 2fps_bw · 0.17mm/px · 4 of 9 frames shown (2 of 2)]
[frame 1/9]
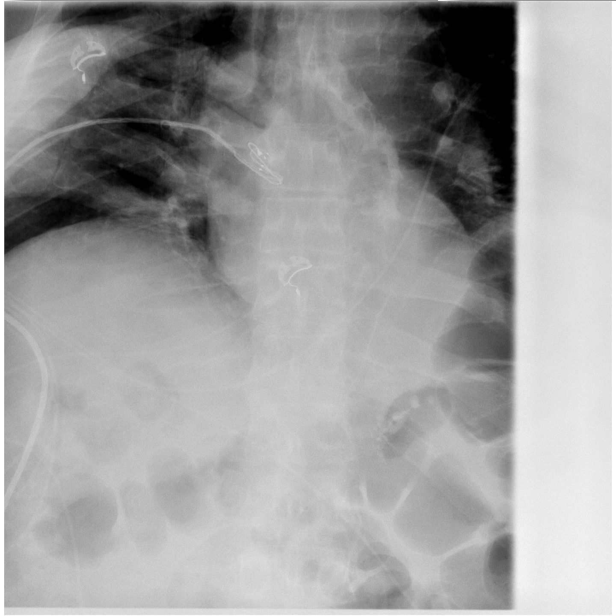
[frame 2/9]
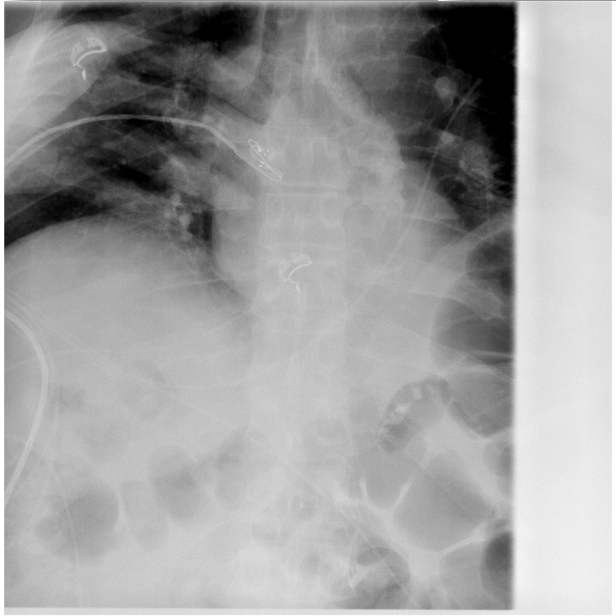
[frame 5/9]
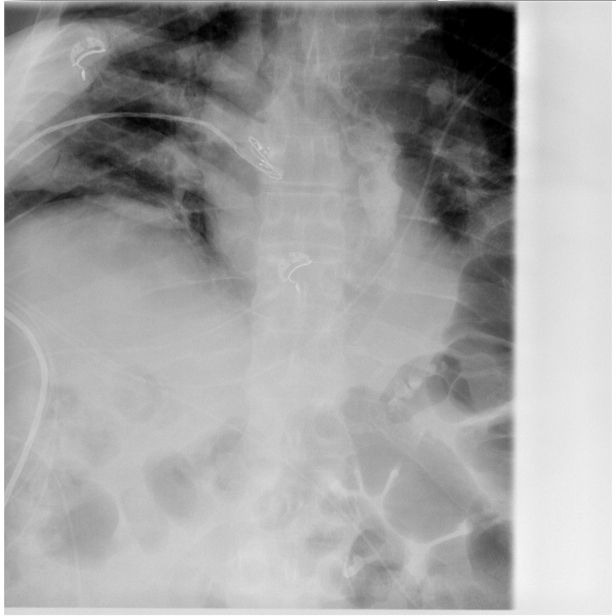
[frame 8/9]
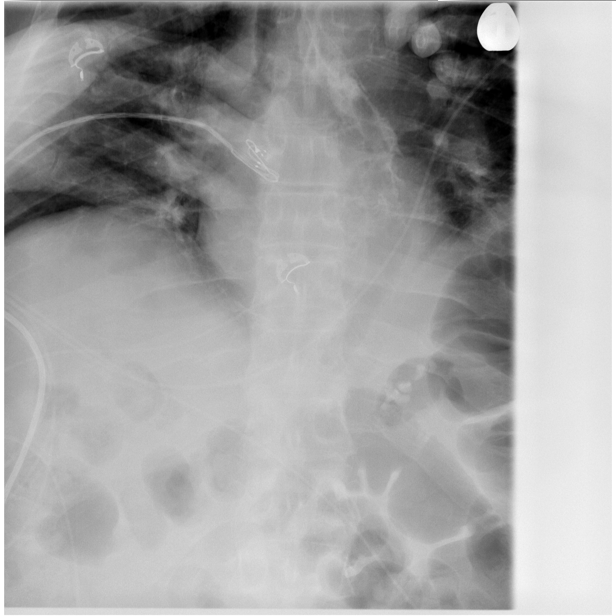

[Series 4: cp_standard · 0.26mm/px · 6 of 6 slices shown]
[im 1/6]
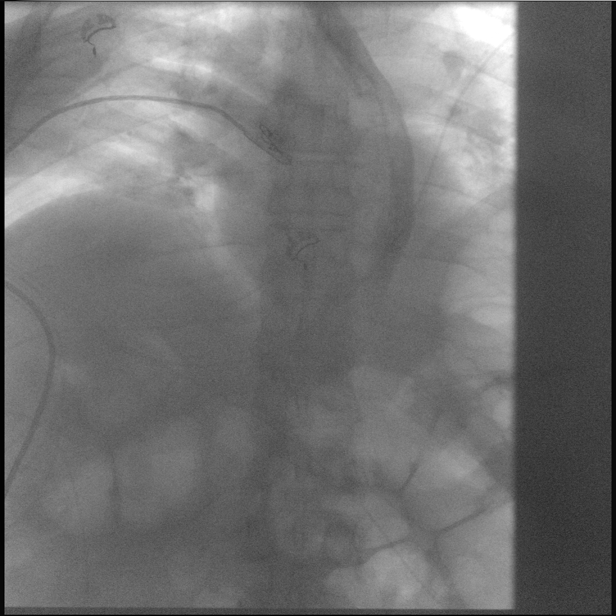
[im 2/6]
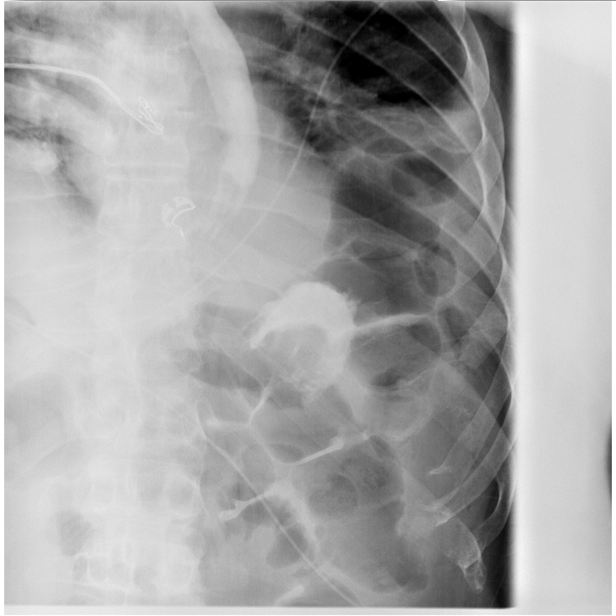
[im 3/6]
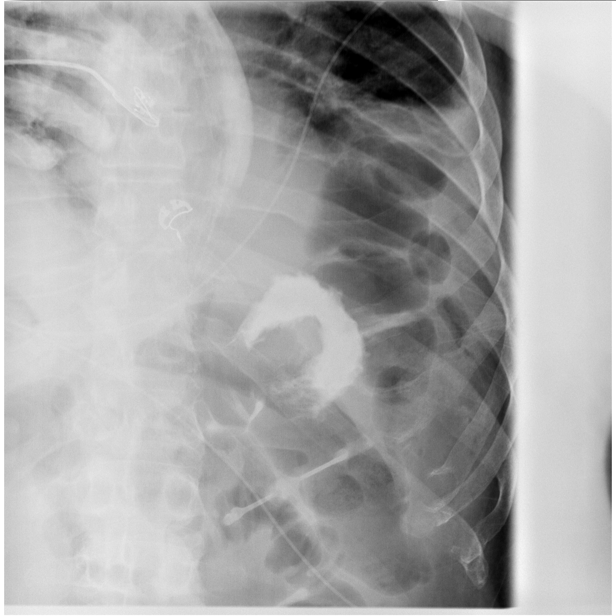
[im 4/6]
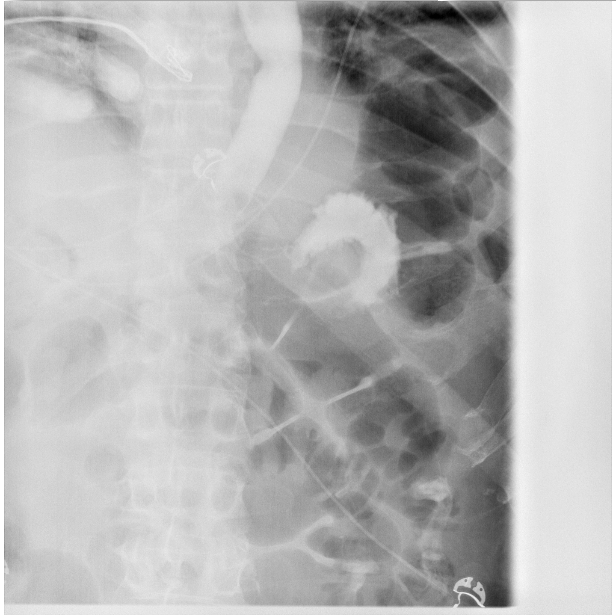
[im 5/6]
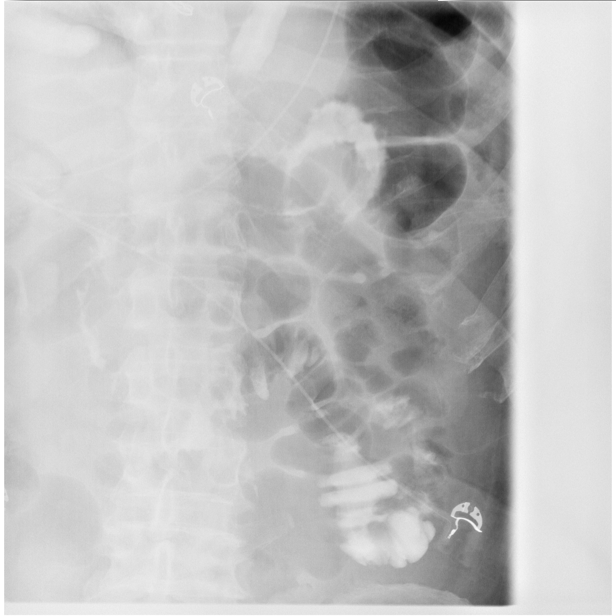
[im 6/6]
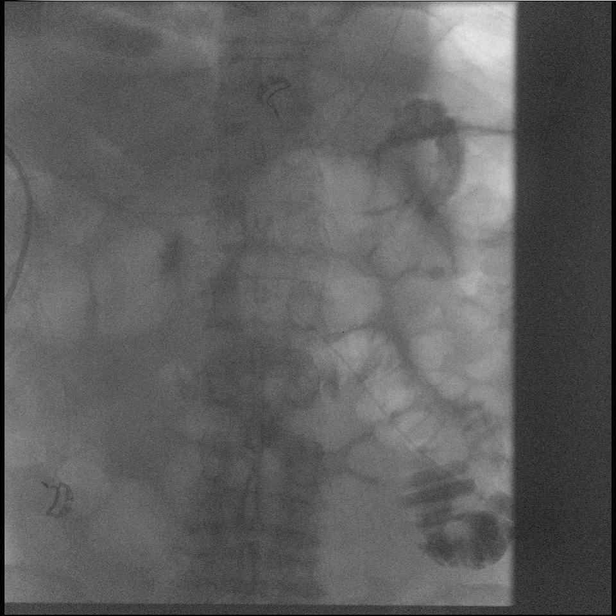

[14 of 14 positions shown; findings below may reference images not displayed]

FLUOROSCOPY TIME:  Fluoroscopy Time:  1 minutes, 54 seconds

Radiation Exposure Index (if provided by the fluoroscopic device):
64.8 mGy

Number of Acquired Spot Images: 6+ 2 video loops.
FINDINGS: The patient was unable willing to drink more than approximately 100
cc of the 50 50 solution of Gastrografin and water. This allowed
only limited visualization of the stomach. Contrast was seen to
passed from the esophagus into the stomach and then into the
duodenum. No definite extraluminal contrast collections were
observed but the volume of contrast ingested (approximately 100 cc)
was inadequate for thorough evaluation.
IMPRESSION: Very limited study with inadequate contrast ingested for true
exclusion of persistent gastric leak. Consideration could be given
for performing an abdominal CT scan with water-soluble Gastroview
which may be tolerated better by the patient.

## 2017-11-23 IMAGING — DX DG ABD PORTABLE 1V
2 series · 2 of 2 positions shown · non-contrast
Comparison: KUB [DATE] at [DATE] a.m.

CLINICAL DATA: Recent gastric perforation.

EXAM:
PORTABLE ABDOMEN - 1 VIEW

[abdomen supine (1 of 2)]
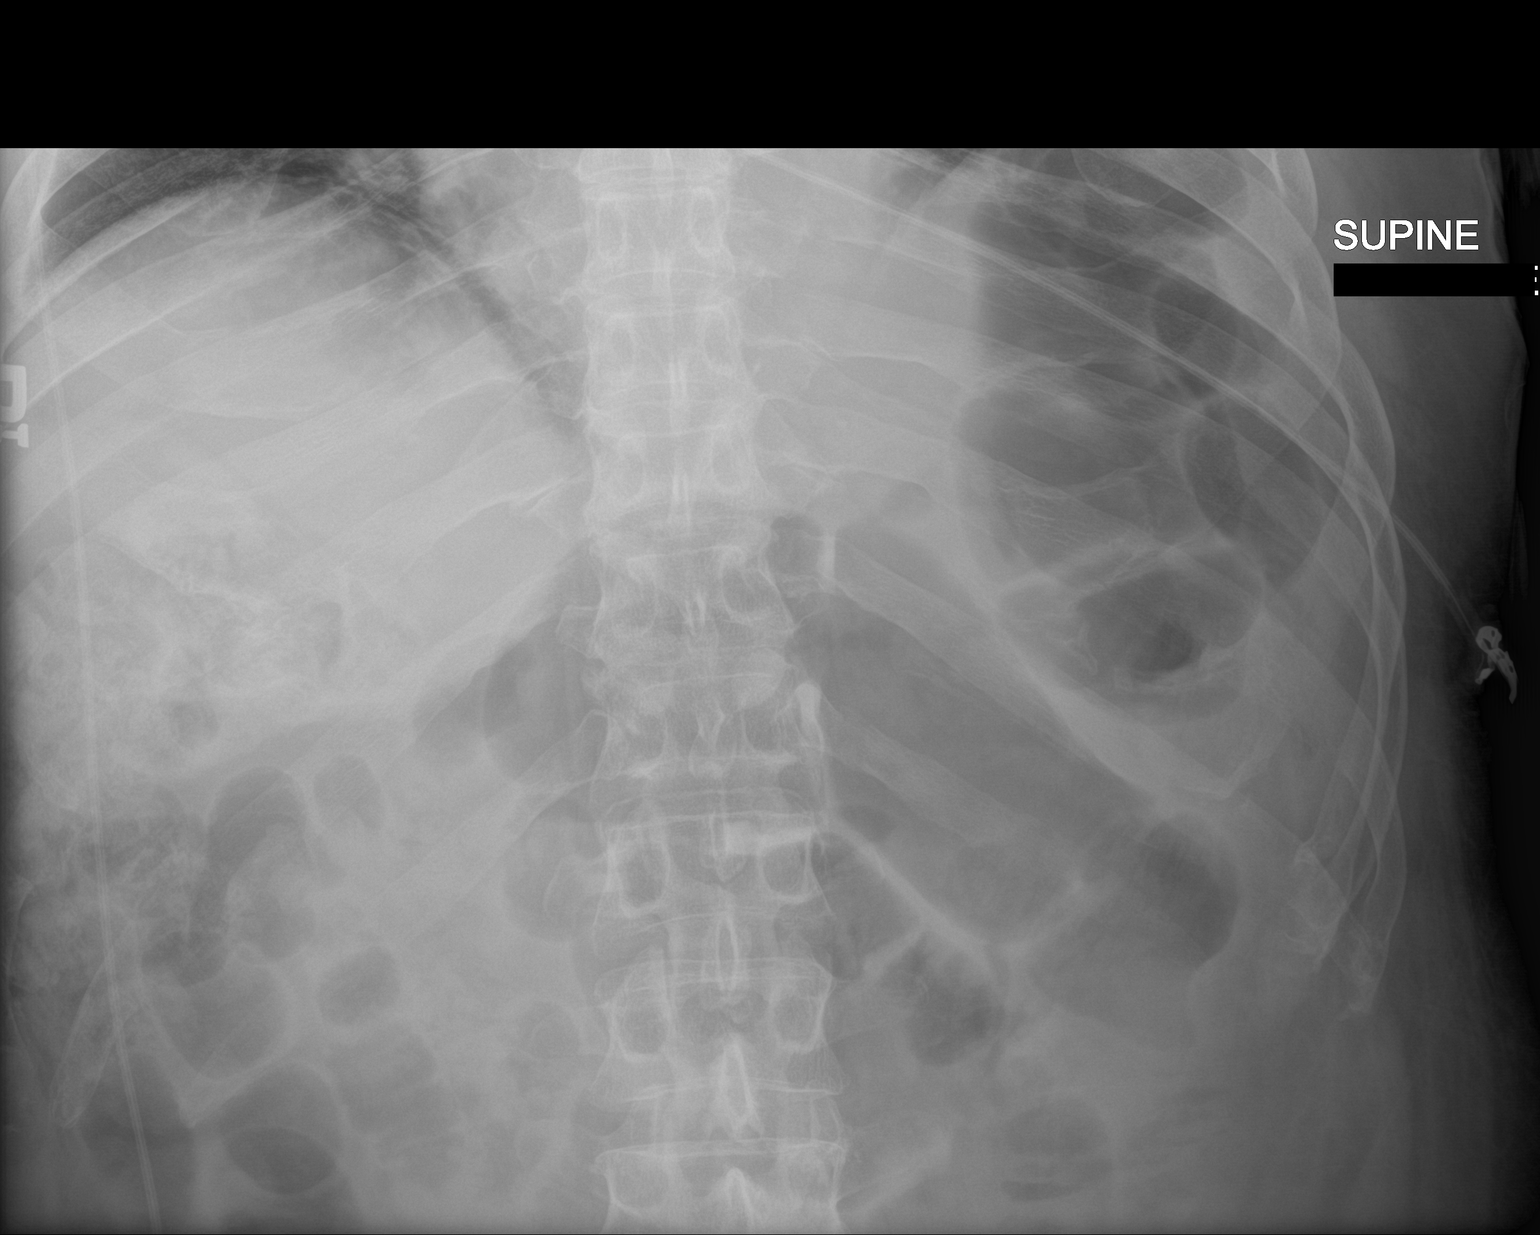

[abdomen supine (2 of 2)]
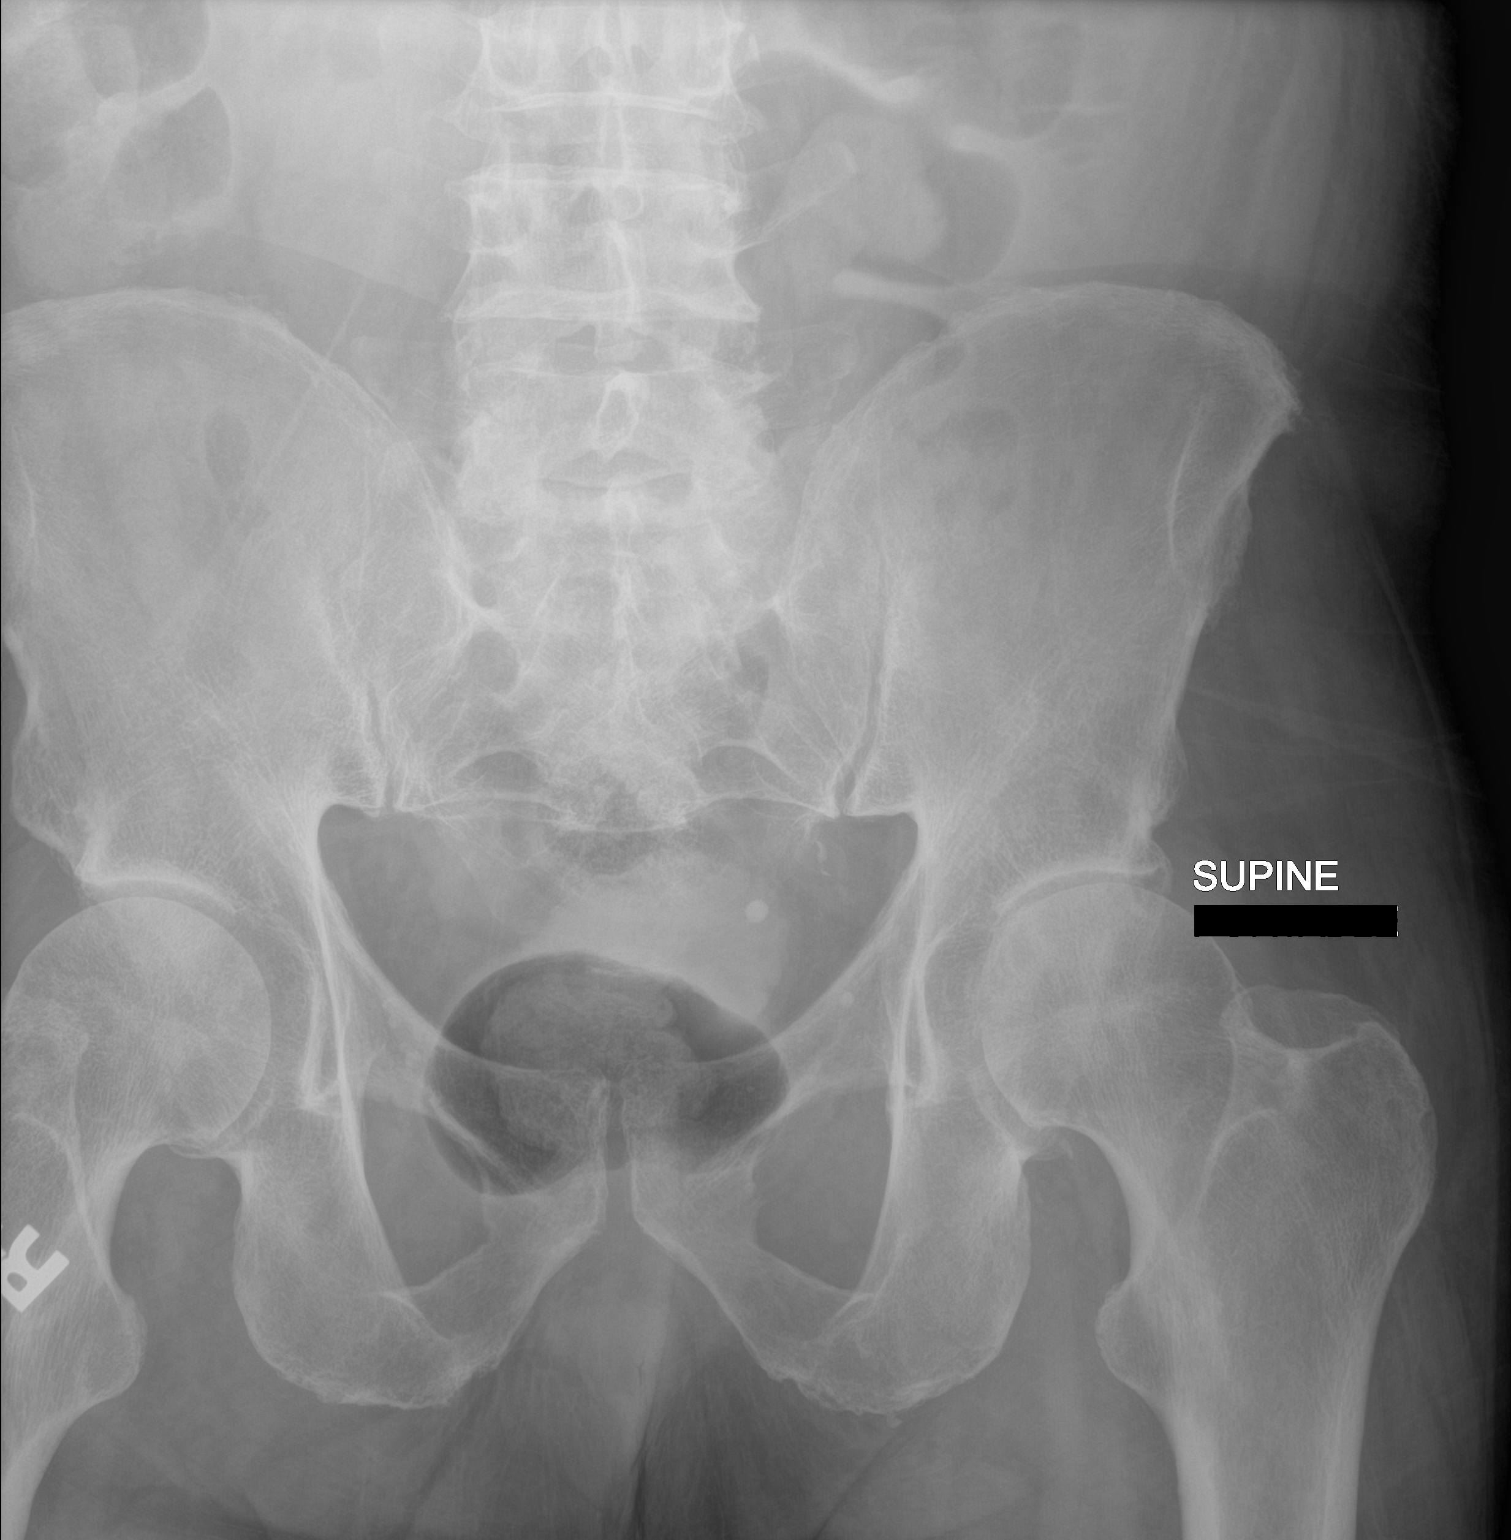

[2 of 2 positions shown; findings below may reference images not displayed]

FINDINGS: There is a moderate amount of gas within small and large bowel in
the mid and upper abdomen. No definite free extraluminal collections
are observed. There is moderate stool burden in the right colon.
There are no abnormal soft tissue calcifications.
IMPRESSION: Bowel gas pattern may reflect a moderate diffuse ileus. No free
extraluminal gas collections are observed.

## 2017-11-23 MED ORDER — CALCIUM CARBONATE ANTACID 500 MG PO CHEW
1.0000 | CHEWABLE_TABLET | Freq: Every day | ORAL | Status: DC
  Administered 2017-11-26 – 2017-11-28 (×3): 200 mg via ORAL
  Filled 2017-11-23 (×5): qty 1

## 2017-11-23 MED ORDER — DIATRIZOATE MEGLUMINE & SODIUM 66-10 % PO SOLN
90.0000 mL | Freq: Once | ORAL | Status: AC
  Administered 2017-11-23: 90 mL via ORAL

## 2017-11-23 NOTE — Progress Notes (Signed)
[facility] [place] NAME: Alex [name]    MR#:  [id number]  DATE OF BIRTH:  [birth year]  SUBJECTIVE:  CHIEF COMPLAINT: Patient has chronic history of Alzheimer's dementia    Patient is more awake and alert today was able to tell his name and denies abdominal pain Wife who is a healthcare power of attorney and other significant family members at bedside.  REVIEW OF SYSTEMS:  Review of system unobtainable as the patient has chronic baseline dementia  DRUG ALLERGIES:   Allergies  Allergen Reactions  . Penicillins Other (See Comments)    Unknown reaction, childhood  Has patient had a PCN reaction causing immediate rash, facial/tongue/throat swelling, SOB or lightheadedness with hypotension: Yes Has patient had a PCN reaction causing severe rash involving mucus membranes or skin necrosis: No Has patient had a PCN reaction that required hospitalization: No Has patient had a PCN reaction occurring within the last 10 years: No If all of the above answers are "NO", then may proceed with Cephalosporin use.     VITALS:  Blood pressure 118/85, pulse 91, temperature 98.3 F (36.8 C), temperature source Oral, resp. rate 20, height 5\' 11"  (1.803 m), weight 95.1 kg, SpO2 96 %.  PHYSICAL EXAMINATION:  GENERAL:  77 y.o.-year-old patient lying in the bed with no acute distress.  EYES: Pupils equal, round, reactive to light and accommodation. No scleral icterus. Extraocular muscles intact.  HEENT: Head atraumatic, normocephalic. Oropharynx and nasopharynx clear.  NECK:  Supple, no jugular venous distention. No thyroid enlargement, no tenderness.  LUNGS: Normal breath sounds bilaterally, no wheezing, rales,rhonchi or crepitation. No use of accessory muscles of respiration.  CARDIOVASCULAR: S1, S2 normal. No murmurs, rubs, or gallops.  ABDOMEN: Soft, nontender, bowel sounds present.  EXTREMITIES: No pedal edema, cyanosis, or clubbing.   NEUROLOGIC: Arousable but disoriented gait not checked.  PSYCHIATRIC: The patient is arousable but disoriented SKIN: No obvious rash, lesion, or ulcer.    LABORATORY PANEL:   CBC Recent Labs  Lab 11/23/17 0413  WBC 15.8*  HGB 14.8  HCT 43.7  PLT 102*   ------------------------------------------------------------------------------------------------------------------  Chemistries  Recent Labs  Lab 11/23/17 0413  NA 142  K 4.0  CL 108  CO2 25  GLUCOSE 143*  BUN 25*  CREATININE 1.63*  CALCIUM 8.1*   ------------------------------------------------------------------------------------------------------------------  Cardiac Enzymes Recent Labs  Lab 11/21/17 0631  TROPONINI <0.03   ------------------------------------------------------------------------------------------------------------------  RADIOLOGY:  Dg Abd 1 View  Result Date: 11/22/2017 CLINICAL DATA:  Perforated gastric ulcer. Epigastric pain and vomiting. EXAM: ABDOMEN - 1 VIEW COMPARISON:  11/21/2017.  CT 11/21/2017. FINDINGS: Contrast noted in the bladder. Vascular calcifications in the pelvis. Air-filled loops of mildly prominent small and large bowel noted suggesting adynamic ileus. No free air identified. Previously it air within the portal venous system less well identified on today's exam P IMPRESSION: 1. Mild prominence of small and large bowel suggesting mild adynamic ileus. 2. Previously identified air in the portal venous system less well identified on today's exam. Reference is made to prior CT report of 11/21/2017. Electronically Signed   By: Marcello Moores  Register   On: 11/22/2017 09:33   US Renal  Result Date: 11/22/2017 CLINICAL DATA:  Acute renal failure EXAM: RENAL / URINARY TRACT ULTRASOUND COMPLETE COMPARISON:  CT abdomen and pelvis of 11/21/2016 FINDINGS: Right Kidney: Length: 8.9 cm. No hydronephrosis is seen. The renal parenchyma is somewhat echogenic consistent with chronic renal medical disease.  Left Kidney: Length: 9.1 cm. No  hydronephrosis is noted. The renal parenchyma is echogenic consistent with chronic renal medical disease. Bladder: Urinary bladder is unremarkable. IMPRESSION: 1. No hydronephrosis. 2. Echogenic renal parenchyma consistent with chronic renal medical disease. Electronically Signed   By: Ivar Drape M.D.   On: 11/22/2017 16:50   Dg Addison Bailey G Tube Plc W/fl W/rad  Result Date: 11/21/2017 CLINICAL DATA:  Fluoroscopically assisted placement of a nasogastric tube. EXAM: NASO G TUBE PLACEMENT WITH FL AND WITH RAD CONTRAST:  No contrast was administered. FLUOROSCOPY TIME:  Fluoroscopy Time:  1 minutes Radiation Exposure Index (if provided by the fluoroscopic device): 11.5 mGy Number of Acquired Spot Images: 1 screen save COMPARISON:  Noncontrast abdominopelvic CT scan of today's date. FINDINGS: The anticipated procedure was explained to Mr. Lazaro. He voiced his willingness to proceed. The 14 French nasogastric tube was placed under fluoroscopic guidance via the left nostril. The tip of the tube cane to lie in the proximal gastric body with the proximal port in the gastric cardia. The patient tolerated the procedure reasonably well. IMPRESSION: Successful placement of a nasogastric tube with fluoroscopic guidance. No immediate complication. Electronically Signed   By: David  Martinique M.D.   On: 11/21/2017 13:55    EKG:   Orders placed or performed during the hospital encounter of 11/21/17  . ED EKG  . ED EKG  . EKG 12-Lead  . EKG 12-Lead    ASSESSMENT AND PLAN:   Patient 77 year old with history of coronary artery disease possible congestive heart failure presenting with abdominal pain  #7 acute  Abdominal pain due to posterior gastric wall infarct with gastric pneumatosis Further therapy per surgery, plan is for conservative management for now Upper GI series with Gastrografin study Leukocytosis is improving but elevated lactic acid level Patient has does not have any  cardiopulmonary symptoms. Echocardiogram with a 75% ejection fraction.  Cavity size was normal severe concentric hypertrophy is present systolic function was vigorous Broadened the antibiotics to meropenem .Cipro and Flagyl discontinued  #.Acute kidney injury on chronic kidney disease stage III Hydrate with IV fluids, avoid nephrotoxins Nephrology consult placed discussed with Dr. Juleen China, appreciate nephrology recommendations Baseline creatinine 1.2 Monitor renal function creatinine 1.65-1.63  BUN 24   #  Essential hypertension  patient being n.p.o. we will use IV hydralazine as needed  #.  Coronary artery disease with no chest pain Cardiac enzymes negative EKG unrevealing Hold aspirin other cardiac meds for now until able to take oral p.o.  #.  Alzheimer's dementia-chronic  #  BPH resume Proscar when able to take oral      All the records are reviewed and case discussed with Care Management/Social Workerr. Management plans discussed with the patient, family and they are in agreement.  CODE STATUS:   TOTAL TIME TAKING CARE OF THIS PATIENT: 36  minutes.   POSSIBLE D/C IN 2-3 DAYS, DEPENDING ON CLINICAL CONDITION.  Note: This dictation was prepared with Dragon dictation along with smaller phrase technology. Any transcriptional errors that result from this process are unintentional.   Nicholes Mango M.D on 11/23/2017 at 9:09 AM  Between 7am to 6pm - Pager - [phone] After 6pm go to [email or web address] - password [email or web address]  Office  [phone]  CC: Primary care physician; Patient, No Pcp Per

## 2017-11-23 NOTE — Progress Notes (Signed)
Pharmacy Antibiotic Note  Alex [name] is a 77 y.o. male admitted on 11/21/2017 with intra-abdominal infection. Patient previously on ciprofloxacin and metronidazole. Yesterday, concern for patient's increasing WBC and surgery recommended switching to broad spectrum antibiotic. Pharmacy consulted for meropenem dosing.  Plan: Continue meropenem 1 g IV q12h Will continue to monitor for any changes in renal function for additional dose adjustments  Height: 5\' 11"  (180.3 cm) Weight: 209 lb 10.5 oz (95.1 kg) IBW/kg (Calculated) : 75.3  Temp (24hrs), Avg:98.6 F (37 C), Min:98.3 F (36.8 C), Max:98.7 F (37.1 C)  Recent Labs  Lab 11/21/17 0142 11/21/17 0223 11/22/17 0356 11/22/17 1129 11/22/17 1432 11/23/17 0413  WBC 14.5*  --  20.2*  --   --  15.8*  CREATININE  --  1.07 1.65*  --   --  1.63*  LATICACIDVEN  --   --   --  3.4* 3.6*  --     Estimated Creatinine Clearance: 44.7 mL/min (A) (by C-G formula based on SCr of 1.63 mg/dL (H)).    Allergies  Allergen Reactions  . Penicillins Other (See Comments)    Unknown reaction, childhood  Has patient had a PCN reaction causing immediate rash, facial/tongue/throat swelling, SOB or lightheadedness with hypotension: Yes Has patient had a PCN reaction causing severe rash involving mucus membranes or skin necrosis: No Has patient had a PCN reaction that required hospitalization: No Has patient had a PCN reaction occurring within the last 10 years: No If all of the above answers are "NO", then may proceed with Cephalosporin use.     Antimicrobials this admission: Ciprofloxacin 8/26 >> 8/27 Flagyl 8/26 >> 8/27 Meropenem 8/27 >>  Dose adjustments this admission:   Microbiology results:   Thank you for allowing pharmacy to be a part of this patient's care.  Tawnya Crook, PharmD Pharmacy Resident  11/23/2017 3:14 PM

## 2017-11-23 NOTE — Progress Notes (Addendum)
[facility] Progress Note     Subjective: Patient is much more awake and interactive this morning. Patient's wife at bedside in agreement with this. Continues to have no complaints of abdominal pain, nausea, or emesis. No fevers or chills.   Objective: Vital signs in last 24 hours: Temp:  [98.2 F (36.8 C)-98.7 F (37.1 C)] 98.3 F (36.8 C) (08/28 0554) Pulse Rate:  [88-94] 91 (08/28 0554) Resp:  [18-20] 20 (08/28 0554) BP: (118-143)/(85-104) 118/85 (08/28 0554) SpO2:  [96 %-98 %] 96 % (08/28 0554) Last BM Date: 11/21/17  Intake/Output from previous day: 08/27 0701 - 08/28 0700 In: 4118.1 [I.V.:3418; IV Piggyback:700.1] Out: 780 [Urine:780] Intake/Output this shift: No intake/output data recorded.  PE: Gen:  Alert, NAD, pleasant Abd: Soft, non-tender, non-distended, no HSM Skin: warm and dry, no rashes  Psych: A&Ox3   Lab Results:  Recent Labs    11/22/17 0356 11/23/17 0413  WBC 20.2* 15.8*  HGB 15.6 14.8  HCT 45.9 43.7  PLT 118* 102*   BMET Recent Labs    11/22/17 0356 11/23/17 0413  NA 139 142  K 4.1 4.0  CL 107 108  CO2 25 25  GLUCOSE 141* 143*  BUN 24* 25*  CREATININE 1.65* 1.63*  CALCIUM 8.1* 8.1*   PT/INR No results for input(s): LABPROT, INR in the last 72 hours. CMP     Component Value Date/Time   NA 142 11/23/2017 0413   K 4.0 11/23/2017 0413   CL 108 11/23/2017 0413   CO2 25 11/23/2017 0413   GLUCOSE 143 (H) 11/23/2017 0413   BUN 25 (H) 11/23/2017 0413   CREATININE 1.63 (H) 11/23/2017 0413   CALCIUM 8.1 (L) 11/23/2017 0413   PROT 7.4 04/27/2017 2035   ALBUMIN 4.2 04/27/2017 2035   AST 24 04/27/2017 2035   ALT 13 (L) 04/27/2017 2035   ALKPHOS 67 04/27/2017 2035   BILITOT 0.6 04/27/2017 2035   GFRNONAA 39 (L) 11/23/2017 0413   GFRAA 45 (L) 11/23/2017 0413   Lipase  No results found for: LIPASE     Studies/Results: Dg Abd 1 View  Result Date: 11/22/2017 CLINICAL DATA:  Perforated gastric ulcer. Epigastric  pain and vomiting. EXAM: ABDOMEN - 1 VIEW COMPARISON:  11/21/2017.  CT 11/21/2017. FINDINGS: Contrast noted in the bladder. Vascular calcifications in the pelvis. Air-filled loops of mildly prominent small and large bowel noted suggesting adynamic ileus. No free air identified. Previously it air within the portal venous system less well identified on today's exam P IMPRESSION: 1. Mild prominence of small and large bowel suggesting mild adynamic ileus. 2. Previously identified air in the portal venous system less well identified on today's exam. Reference is made to prior CT report of 11/21/2017. Electronically Signed   By: Marcello Moores  Register   On: 11/22/2017 09:33   US Renal  Result Date: 11/22/2017 CLINICAL DATA:  Acute renal failure EXAM: RENAL / URINARY TRACT ULTRASOUND COMPLETE COMPARISON:  CT abdomen and pelvis of 11/21/2016 FINDINGS: Right Kidney: Length: 8.9 cm. No hydronephrosis is seen. The renal parenchyma is somewhat echogenic consistent with chronic renal medical disease. Left Kidney: Length: 9.1 cm. No hydronephrosis is noted. The renal parenchyma is echogenic consistent with chronic renal medical disease. Bladder: Urinary bladder is unremarkable. IMPRESSION: 1. No hydronephrosis. 2. Echogenic renal parenchyma consistent with chronic renal medical disease. Electronically Signed   By: Ivar Drape M.D.   On: 11/22/2017 16:50   Dg Addison Bailey G Tube Plc W/fl W/rad  Result Date: 11/21/2017 CLINICAL DATA:  Fluoroscopically assisted placement of a nasogastric tube. EXAM: NASO G TUBE PLACEMENT WITH FL AND WITH RAD CONTRAST:  No contrast was administered. FLUOROSCOPY TIME:  Fluoroscopy Time:  1 minutes Radiation Exposure Index (if provided by the fluoroscopic device): 11.5 mGy Number of Acquired Spot Images: 1 screen save COMPARISON:  Noncontrast abdominopelvic CT scan of today's date. FINDINGS: The anticipated procedure was explained to Mr. Sotelo. He voiced his willingness to proceed. The 14 French nasogastric  tube was placed under fluoroscopic guidance via the left nostril. The tip of the tube cane to lie in the proximal gastric body with the proximal port in the gastric cardia. The patient tolerated the procedure reasonably well. IMPRESSION: Successful placement of a nasogastric tube with fluoroscopic guidance. No immediate complication. Electronically Signed   By: David  Martinique M.D.   On: 11/21/2017 13:55    Anti-infectives: Anti-infectives (From admission, onward)   Start     Dose/Rate Route Frequency Ordered Stop   11/22/17 1800  meropenem (MERREM) 1 g in sodium chloride 0.9 % 100 mL IVPB  Status:  Discontinued     1 g 200 mL/hr over 30 Minutes Intravenous Every 12 hours 11/22/17 1438 11/22/17 1545   11/22/17 1600  meropenem (MERREM) 1 g in sodium chloride 0.9 % 100 mL IVPB     1 g 200 mL/hr over 30 Minutes Intravenous Every 12 hours 11/22/17 1545     11/21/17 1800  ciprofloxacin (CIPRO) IVPB 400 mg  Status:  Discontinued     400 mg 200 mL/hr over 60 Minutes Intravenous Every 12 hours 11/21/17 1012 11/22/17 1347   11/21/17 1400  metroNIDAZOLE (FLAGYL) IVPB 500 mg  Status:  Discontinued     500 mg 100 mL/hr over 60 Minutes Intravenous Every 8 hours 11/21/17 1012 11/22/17 1438   11/21/17 0700  ciprofloxacin (CIPRO) IVPB 400 mg     400 mg 200 mL/hr over 60 Minutes Intravenous  Once 11/21/17 0659 11/21/17 0816   11/21/17 0700  metroNIDAZOLE (FLAGYL) IVPB 500 mg     500 mg 100 mL/hr over 60 Minutes Intravenous  Once 11/21/17 0659 11/21/17 5400       Assessment/Plan  Gastric Pneumatosis - Patient showing some clinical improvement this morning.  - Abdominal exam is not concerning for peritonitis this morning. Abdominal XR this morning reviewed with Dr. Dahlia Byes, no free air, dilated bowel loops unchanged from yesterdays image. Will obtain an upper GI series with gastrografin this morning to rule out gastric perforation.  - Remain NPO at this time.  - Leukocytosis improved on CBC this morning,  continue meropenem  - Renal function unchanged this AM. Nephrology consulted and following, we appreciate their input. Renal US was consistent with chronic medical disease. Continue IVF - Medicine following for chronic medical conditions, appreciate their help.  - Continue protective PPI, DVT Prophylaxis   LOS: 2 days    Edison Simon , PA-C [facility] 11/23/2017, 8:38 AM [phone] M-F: 7am - 4pm

## 2017-11-24 LAB — BASIC METABOLIC PANEL
Anion gap: 6 (ref 5–15)
BUN: 21 mg/dL (ref 8–23)
CO2: 27 mmol/L (ref 22–32)
Calcium: 8 mg/dL — ABNORMAL LOW (ref 8.9–10.3)
Chloride: 111 mmol/L (ref 98–111)
Creatinine, Ser: 1.59 mg/dL — ABNORMAL HIGH (ref 0.61–1.24)
GFR calc Af Amer: 47 mL/min — ABNORMAL LOW (ref >60)
GFR calc non Af Amer: 40 mL/min — ABNORMAL LOW (ref >60)
Glucose, Bld: 114 mg/dL — ABNORMAL HIGH (ref 70–99)
Potassium: 3.6 mmol/L (ref 3.5–5.1)
Sodium: 144 mmol/L (ref 135–145)

## 2017-11-24 LAB — CBC
HCT: 37.6 % — ABNORMAL LOW (ref 40.0–52.0)
Hemoglobin: 12.7 g/dL — ABNORMAL LOW (ref 13.0–18.0)
MCH: 30.4 pg (ref 26.0–34.0)
MCHC: 33.7 g/dL (ref 32.0–36.0)
MCV: 90 fL (ref 80.0–100.0)
Platelets: 95 10*3/uL — ABNORMAL LOW (ref 150–440)
RBC: 4.18 MIL/uL — ABNORMAL LOW (ref 4.40–5.90)
RDW: 14.1 % (ref 11.5–14.5)
WBC: 11.6 10*3/uL — ABNORMAL HIGH (ref 3.8–10.6)

## 2017-11-24 MED ORDER — BOOST / RESOURCE BREEZE PO LIQD CUSTOM
1.0000 | Freq: Three times a day (TID) | ORAL | Status: DC
  Administered 2017-11-24 – 2017-11-28 (×7): 1 via ORAL

## 2017-11-24 NOTE — Progress Notes (Signed)
Pharmacy Antibiotic Note  Alex [name] R48 y.o.maleadmittedon 8/26/2019with intra-abdominal infection.Patient previously on ciprofloxacin and metronidazole. Pharmacy consulted for meropenemdosing for broad spectrum coverage w/ spike in patient's WBC. WBC is now trending back down.  Plan: Continue meropenem 1 g IV q12h Will continue to monitor for any changes in renal function for additional dose adjustments  Height: 5\' 11"  (180.3 cm) Weight: 209 lb 10.5 oz (95.1 kg) IBW/kg (Calculated) : 75.3  Temp (24hrs), Avg:98.6 F (37 C), Min:98.3 F (36.8 C), Max:98.8 F (37.1 C)  Recent Labs  Lab 11/21/17 0142 11/21/17 0223 11/22/17 0356 11/22/17 1129 11/22/17 1432 11/23/17 0413 11/24/17 0435  WBC 14.5*  --  20.2*  --   --  15.8* 11.6*  CREATININE  --  1.07 1.65*  --   --  1.63* 1.59*  LATICACIDVEN  --   --   --  3.4* 3.6*  --   --     Estimated Creatinine Clearance: 45.8 mL/min (A) (by C-G formula based on SCr of 1.59 mg/dL (H)).    Allergies  Allergen Reactions  . Penicillins Other (See Comments)    Unknown reaction, childhood  Has patient had a PCN reaction causing immediate rash, facial/tongue/throat swelling, SOB or lightheadedness with hypotension: Yes Has patient had a PCN reaction causing severe rash involving mucus membranes or skin necrosis: No Has patient had a PCN reaction that required hospitalization: No Has patient had a PCN reaction occurring within the last 10 years: No If all of the above answers are "NO", then may proceed with Cephalosporin use.     Antimicrobials this admission: Ciprofloxacin 8/26>>8/27 Flagyl 8/26>>8/27 Meropenem 8/27 >>   Thank you for allowing pharmacy to be a part of this patient's care.  Tawnya Crook, PharmD Pharmacy Resident  11/24/2017 1:54 PM

## 2017-11-24 NOTE — Care Management Important Message (Signed)
Copy of signed IM left with patient's family in room.  

## 2017-11-24 NOTE — Progress Notes (Signed)
[facility] Progress Note     Subjective: Pt resting comfortably in bed with wife at bedside. He denied any complaints this morning including abdominal pain, nausea, emesis, CP, or SOB.   Objective: Vital signs in last 24 hours: Temp:  [98.3 F (36.8 C)-98.8 F (37.1 C)] 98.3 F (36.8 C) (08/29 0427) Pulse Rate:  [91-101] 101 (08/29 0427) Resp:  [18-20] 20 (08/29 0427) BP: (123-154)/(84-86) 154/86 (08/29 0427) SpO2:  [94 %-98 %] 94 % (08/29 0427) Last BM Date: 11/21/17  Intake/Output from previous day: 08/28 0701 - 08/29 0700 In: 1069.3 [I.V.:1069.3] Out: 850 [Urine:850] Intake/Output this shift: No intake/output data recorded.  PE: Gen:  Alert, NAD, pleasant Card:  Regular rate and rhythm, pedal pulses 2+ BL Pulm:  Normal effort, clear to auscultation bilaterally Abd: Soft, non-tender, non-distended, bowel sounds present in all 4 quadrants MSK: No peripheral Edema Skin: warm and dry, no rashes  Psych: Alert and oriented to self only  Lab Results:  Recent Labs    11/23/17 0413 11/24/17 0435  WBC 15.8* 11.6*  HGB 14.8 12.7*  HCT 43.7 37.6*  PLT 102* 95*   BMET Recent Labs    11/23/17 0413 11/24/17 0435  NA 142 144  K 4.0 3.6  CL 108 111  CO2 25 27  GLUCOSE 143* 114*  BUN 25* 21  CREATININE 1.63* 1.59*  CALCIUM 8.1* 8.0*   PT/INR No results for input(s): LABPROT, INR in the last 72 hours. CMP     Component Value Date/Time   NA 144 11/24/2017 0435   K 3.6 11/24/2017 0435   CL 111 11/24/2017 0435   CO2 27 11/24/2017 0435   GLUCOSE 114 (H) 11/24/2017 0435   BUN 21 11/24/2017 0435   CREATININE 1.59 (H) 11/24/2017 0435   CALCIUM 8.0 (L) 11/24/2017 0435   PROT 7.4 04/27/2017 2035   ALBUMIN 4.2 04/27/2017 2035   AST 24 04/27/2017 2035   ALT 13 (L) 04/27/2017 2035   ALKPHOS 67 04/27/2017 2035   BILITOT 0.6 04/27/2017 2035   GFRNONAA 40 (L) 11/24/2017 0435   GFRAA 47 (L) 11/24/2017 0435   Lipase  No results found for:  LIPASE     Studies/Results: US Renal  Result Date: 11/22/2017 CLINICAL DATA:  Acute renal failure EXAM: RENAL / URINARY TRACT ULTRASOUND COMPLETE COMPARISON:  CT abdomen and pelvis of 11/21/2016 FINDINGS: Right Kidney: Length: 8.9 cm. No hydronephrosis is seen. The renal parenchyma is somewhat echogenic consistent with chronic renal medical disease. Left Kidney: Length: 9.1 cm. No hydronephrosis is noted. The renal parenchyma is echogenic consistent with chronic renal medical disease. Bladder: Urinary bladder is unremarkable. IMPRESSION: 1. No hydronephrosis. 2. Echogenic renal parenchyma consistent with chronic renal medical disease. Electronically Signed   By: Ivar Drape M.D.   On: 11/22/2017 16:50   Dg Abd Portable 1v  Result Date: 11/23/2017 CLINICAL DATA:  Recent gastric perforation. EXAM: PORTABLE ABDOMEN - 1 VIEW COMPARISON:  KUB of November 22, 2017 at 6:40 a.m. FINDINGS: There is a moderate amount of gas within small and large bowel in the mid and upper abdomen. No definite free extraluminal collections are observed. There is moderate stool burden in the right colon. There are no abnormal soft tissue calcifications. IMPRESSION: Bowel gas pattern may reflect a moderate diffuse ileus. No free extraluminal gas collections are observed. Electronically Signed   By: David  Martinique M.D.   On: 11/23/2017 11:27   Dg Duanne Limerick W/water Sol Cm  Result Date: 11/23/2017 CLINICAL DATA:  Recent gastric  perforation. Assess for evidence of a persistently. EXAM: WATER SOLUBLE UPPER GI SERIES TECHNIQUE: Single-column upper GI series was performed using water soluble contrast. CONTRAST:  1/2 strength Gastrografin was employed. COMPARISON:  Portable abdominal radiograph of today at 5:40 a.m. FLUOROSCOPY TIME:  Fluoroscopy Time:  1 minutes, 54 seconds Radiation Exposure Index (if provided by the fluoroscopic device): 64.8 mGy Number of Acquired Spot Images: 6+ 2 video loops. FINDINGS: The patient was unable willing to  drink more than approximately 100 cc of the 50 50 solution of Gastrografin and water. This allowed only limited visualization of the stomach. Contrast was seen to passed from the esophagus into the stomach and then into the duodenum. No definite extraluminal contrast collections were observed but the volume of contrast ingested (approximately 100 cc) was inadequate for thorough evaluation. IMPRESSION: Very limited study with inadequate contrast ingested for true exclusion of persistent gastric leak. Consideration could be given for performing an abdominal CT scan with water-soluble Gastroview which may be tolerated better by the patient. Electronically Signed   By: David  Martinique M.D.   On: 11/23/2017 11:26    Anti-infectives: Anti-infectives (From admission, onward)   Start     Dose/Rate Route Frequency Ordered Stop   11/22/17 1800  meropenem (MERREM) 1 g in sodium chloride 0.9 % 100 mL IVPB  Status:  Discontinued     1 g 200 mL/hr over 30 Minutes Intravenous Every 12 hours 11/22/17 1438 11/22/17 1545   11/22/17 1600  meropenem (MERREM) 1 g in sodium chloride 0.9 % 100 mL IVPB     1 g 200 mL/hr over 30 Minutes Intravenous Every 12 hours 11/22/17 1545     11/21/17 1800  ciprofloxacin (CIPRO) IVPB 400 mg  Status:  Discontinued     400 mg 200 mL/hr over 60 Minutes Intravenous Every 12 hours 11/21/17 1012 11/22/17 1347   11/21/17 1400  metroNIDAZOLE (FLAGYL) IVPB 500 mg  Status:  Discontinued     500 mg 100 mL/hr over 60 Minutes Intravenous Every 8 hours 11/21/17 1012 11/22/17 1438   11/21/17 0700  ciprofloxacin (CIPRO) IVPB 400 mg     400 mg 200 mL/hr over 60 Minutes Intravenous  Once 11/21/17 0659 11/21/17 0816   11/21/17 0700  metroNIDAZOLE (FLAGYL) IVPB 500 mg     500 mg 100 mL/hr over 60 Minutes Intravenous  Once 11/21/17 5883 11/21/17 2549       Assessment/Plan  Gastric Pneumatosis - Patient continues to show clinical improvement this morning.  - Abdominal exam is not concerning for  peritonitis this morning. - Will consider CTA later this afternoon to access for thrombus as source of pneumatosis.   - Remain NPO at this time.  - Leukocytosis improved on CBC this morning, continue meropenem. Will continue to follow - Renal function improved this AM. Nephrology consulted and following, we appreciate their input. Renal US was consistent with chronic medical disease. Continue IVF, will continue to reassess - Medicine following for chronic medical conditions, appreciate their help.  - Continue protective PPI, DVT Prophylaxis    LOS: 3 days    Edison Simon , PA-C [facility] 11/24/2017, 9:34 AM [phone] M-F: 7am - 4pm

## 2017-11-24 NOTE — Progress Notes (Signed)
[facility]  ROUNDING NOTE   Subjective:   Family at bedside.   Patient more awake.   Creatinine 1.59 (1.63)  Objective:  Vital signs in last 24 hours:  Temp:  [98.3 F (36.8 C)-98.8 F (37.1 C)] 98.3 F (36.8 C) (08/29 0427) Pulse Rate:  [99-101] 101 (08/29 0427) Resp:  [20] 20 (08/29 0427) BP: (123-154)/(86) 154/86 (08/29 0427) SpO2:  [94 %-98 %] 94 % (08/29 0427)  Weight change:  Filed Weights   11/21/17 1003  Weight: 95.1 kg    Intake/Output: I/O last 3 completed shifts: In: 2525.1 [I.V.:2425.1; IV Piggyback:100] Out: 1330 [Urine:1330]   Intake/Output this shift:  No intake/output data recorded.  Physical Exam: General: NAD, laying in bed  Head: Normocephalic, atraumatic. Moist oral mucosal membranes  Eyes: Anicteric, PERRL  Neck: Supple, trachea midline  Lungs:  Clear to auscultation  Heart: Regular rate and rhythm  Abdomen:  Soft, nontender,   Extremities:  no peripheral edema.  Neurologic: Alert to self, moving all four extremities  Skin: No lesions        Basic Metabolic Panel: Recent Labs  Lab 11/21/17 0223 11/22/17 0356 11/23/17 0413 11/24/17 0435  NA 143 139 142 144  K 3.6 4.1 4.0 3.6  CL 113* 107 108 111  CO2 22 25 25 27   GLUCOSE 90 141* 143* 114*  BUN 11 24* 25* 21  CREATININE 1.07 1.65* 1.63* 1.59*  CALCIUM 6.6* 8.1* 8.1* 8.0*    Liver Function Tests: No results for input(s): AST, ALT, ALKPHOS, BILITOT, PROT, ALBUMIN in the last 168 hours. No results for input(s): LIPASE, AMYLASE in the last 168 hours. No results for input(s): AMMONIA in the last 168 hours.  CBC: Recent Labs  Lab 11/21/17 0142 11/22/17 0356 11/23/17 0413 11/24/17 0435  WBC 14.5* 20.2* 15.8* 11.6*  HGB 14.3 15.6 14.8 12.7*  HCT 41.9 45.9 43.7 37.6*  MCV 90.6 90.0 91.2 90.0  PLT 109* 118* 102* 95*    Cardiac Enzymes: Recent Labs  Lab 11/21/17 0223 11/21/17 0631  TROPONINI <0.03 <0.03    BNP: Invalid input(s): POCBNP  CBG: No  results for input(s): GLUCAP in the last 168 hours.  Microbiology: Results for orders placed or performed during the hospital encounter of 08/15/16  Urine culture     Status: None   Collection Time: 08/15/16  7:19 PM  Result Value Ref Range Status   Specimen Description URINE, RANDOM  Final   Special Requests NONE  Final   Culture   Final    NO GROWTH Performed at [facility], [place] [address], [place], [place] [address]    Report Status 08/17/2016 FINAL  Final    Coagulation Studies: No results for input(s): LABPROT, INR in the last 72 hours.  Urinalysis: No results for input(s): COLORURINE, LABSPEC, PHURINE, GLUCOSEU, HGBUR, BILIRUBINUR, KETONESUR, PROTEINUR, UROBILINOGEN, NITRITE, LEUKOCYTESUR in the last 72 hours.  Invalid input(s): APPERANCEUR    Imaging: US Renal  Result Date: 11/22/2017 CLINICAL DATA:  Acute renal failure EXAM: RENAL / URINARY TRACT ULTRASOUND COMPLETE COMPARISON:  CT abdomen and pelvis of 11/21/2016 FINDINGS: Right Kidney: Length: 8.9 cm. No hydronephrosis is seen. The renal parenchyma is somewhat echogenic consistent with chronic renal medical disease. Left Kidney: Length: 9.1 cm. No hydronephrosis is noted. The renal parenchyma is echogenic consistent with chronic renal medical disease. Bladder: Urinary bladder is unremarkable. IMPRESSION: 1. No hydronephrosis. 2. Echogenic renal parenchyma consistent with chronic renal medical disease. Electronically Signed   By: Windy Canny.D.  On: 11/22/2017 16:50   Dg Abd Portable 1v  Result Date: 11/23/2017 CLINICAL DATA:  Recent gastric perforation. EXAM: PORTABLE ABDOMEN - 1 VIEW COMPARISON:  KUB of November 22, 2017 at 6:40 a.m. FINDINGS: There is a moderate amount of gas within small and large bowel in the mid and upper abdomen. No definite free extraluminal collections are observed. There is moderate stool burden in the right colon. There are no abnormal soft tissue calcifications. IMPRESSION: Bowel gas  pattern may reflect a moderate diffuse ileus. No free extraluminal gas collections are observed. Electronically Signed   By: David  Martinique M.D.   On: 11/23/2017 11:27   Dg Duanne Limerick W/water Sol Cm  Result Date: 11/23/2017 CLINICAL DATA:  Recent gastric perforation. Assess for evidence of a persistently. EXAM: WATER SOLUBLE UPPER GI SERIES TECHNIQUE: Single-column upper GI series was performed using water soluble contrast. CONTRAST:  1/2 strength Gastrografin was employed. COMPARISON:  Portable abdominal radiograph of today at 5:40 a.m. FLUOROSCOPY TIME:  Fluoroscopy Time:  1 minutes, 54 seconds Radiation Exposure Index (if provided by the fluoroscopic device): 64.8 mGy Number of Acquired Spot Images: 6+ 2 video loops. FINDINGS: The patient was unable willing to drink more than approximately 100 cc of the 50 50 solution of Gastrografin and water. This allowed only limited visualization of the stomach. Contrast was seen to passed from the esophagus into the stomach and then into the duodenum. No definite extraluminal contrast collections were observed but the volume of contrast ingested (approximately 100 cc) was inadequate for thorough evaluation. IMPRESSION: Very limited study with inadequate contrast ingested for true exclusion of persistent gastric leak. Consideration could be given for performing an abdominal CT scan with water-soluble Gastroview which may be tolerated better by the patient. Electronically Signed   By: David  Martinique M.D.   On: 11/23/2017 11:26     Medications:   . dextrose 5% lactated ringers 125 mL/hr at 11/24/17 0733  . meropenem (MERREM) IV 1 g (11/24/17 0557)   . calcium carbonate  1 tablet Oral Daily  . enoxaparin (LOVENOX) injection  40 mg Subcutaneous Q24H  . ondansetron (ZOFRAN) IV  4 mg Intravenous Once  . pantoprazole  40 mg Intravenous Q12H   haloperidol lactate, hydrALAZINE, metoprolol tartrate, morphine injection **OR** morphine injection **OR** morphine injection,  ondansetron **OR** ondansetron (ZOFRAN) IV  Assessment/ Plan:  [name] is a 77 y.o. black male with Alzheimer's dementia, hyeprtension, BPH, coronary artery disease, hyperlipidemia, who was admitted to [facility] on 11/21/2017 for Pneumatosis of intestines [K63.89]  1. Acute renal failure 2. Chronic kidney disease stage III 3. Hypertension:  4. Hematuria  Impression Baseline creatinine of 1.2, GFR of 55 on 04/27/17.  Renal ultrasound reviewed with family.  Recommend outpatient follow up with Nephrology. Please call with questions.    LOS: 3 Leeum Sankey 8/29/201912:17 PM

## 2017-11-24 NOTE — Progress Notes (Signed)
[facility] NAME: Alex [name]    MR#:  [id number]  DATE OF BIRTH:  [birth year]  SUBJECTIVE:  CHIEF COMPLAINT: Denying any abdominal pain due to dementia not a good historian  REVIEW OF SYSTEMS:  Review of system unobtainable as the patient has chronic baseline dementia  DRUG ALLERGIES:   Allergies  Allergen Reactions  . Penicillins Other (See Comments)    Unknown reaction, childhood  Has patient had a PCN reaction causing immediate rash, facial/tongue/throat swelling, SOB or lightheadedness with hypotension: Yes Has patient had a PCN reaction causing severe rash involving mucus membranes or skin necrosis: No Has patient had a PCN reaction that required hospitalization: No Has patient had a PCN reaction occurring within the last 10 years: No If all of the above answers are "NO", then may proceed with Cephalosporin use.     VITALS:  Blood pressure (!) 142/93, pulse 92, temperature 98.7 F (37.1 C), temperature source Oral, resp. rate 20, height 5\' 11"  (1.803 m), weight 95.1 kg, SpO2 100 %.  PHYSICAL EXAMINATION:  GENERAL:  77 y.o.-year-old patient lying in the bed with no acute distress.  EYES: Pupils equal, round, reactive to light and accommodation. No scleral icterus. Extraocular muscles intact.  HEENT: Head atraumatic, normocephalic. Oropharynx and nasopharynx clear.  NECK:  Supple, no jugular venous distention. No thyroid enlargement, no tenderness.  LUNGS: Normal breath sounds bilaterally, no wheezing, rales,rhonchi or crepitation. No use of accessory muscles of respiration.  CARDIOVASCULAR: S1, S2 normal. No murmurs, rubs, or gallops.  ABDOMEN: Soft, nontender, bowel sounds present.  EXTREMITIES: No pedal edema, cyanosis, or clubbing.  NEUROLOGIC: Arousable but disoriented gait not checked.  PSYCHIATRIC: The patient is arousable but disoriented SKIN: No obvious rash, lesion, or ulcer.    LABORATORY PANEL:    CBC Recent Labs  Lab 11/24/17 0435  WBC 11.6*  HGB 12.7*  HCT 37.6*  PLT 95*   ------------------------------------------------------------------------------------------------------------------  Chemistries  Recent Labs  Lab 11/24/17 0435  NA 144  K 3.6  CL 111  CO2 27  GLUCOSE 114*  BUN 21  CREATININE 1.59*  CALCIUM 8.0*   ------------------------------------------------------------------------------------------------------------------  Cardiac Enzymes Recent Labs  Lab 11/21/17 0631  TROPONINI <0.03   ------------------------------------------------------------------------------------------------------------------  RADIOLOGY:  US Renal  Result Date: 11/22/2017 CLINICAL DATA:  Acute renal failure EXAM: RENAL / URINARY TRACT ULTRASOUND COMPLETE COMPARISON:  CT abdomen and pelvis of 11/21/2016 FINDINGS: Right Kidney: Length: 8.9 cm. No hydronephrosis is seen. The renal parenchyma is somewhat echogenic consistent with chronic renal medical disease. Left Kidney: Length: 9.1 cm. No hydronephrosis is noted. The renal parenchyma is echogenic consistent with chronic renal medical disease. Bladder: Urinary bladder is unremarkable. IMPRESSION: 1. No hydronephrosis. 2. Echogenic renal parenchyma consistent with chronic renal medical disease. Electronically Signed   By: Ivar Drape M.D.   On: 11/22/2017 16:50   Dg Abd Portable 1v  Result Date: 11/23/2017 CLINICAL DATA:  Recent gastric perforation. EXAM: PORTABLE ABDOMEN - 1 VIEW COMPARISON:  KUB of November 22, 2017 at 6:40 a.m. FINDINGS: There is a moderate amount of gas within small and large bowel in the mid and upper abdomen. No definite free extraluminal collections are observed. There is moderate stool burden in the right colon. There are no abnormal soft tissue calcifications. IMPRESSION: Bowel gas pattern may reflect a moderate diffuse ileus. No free extraluminal gas collections are observed. Electronically Signed   By: David   Martinique M.D.   On: 11/23/2017 11:27  Dg Duanne Limerick W/water Sol Cm  Result Date: 11/23/2017 CLINICAL DATA:  Recent gastric perforation. Assess for evidence of a persistently. EXAM: WATER SOLUBLE UPPER GI SERIES TECHNIQUE: Single-column upper GI series was performed using water soluble contrast. CONTRAST:  1/2 strength Gastrografin was employed. COMPARISON:  Portable abdominal radiograph of today at 5:40 a.m. FLUOROSCOPY TIME:  Fluoroscopy Time:  1 minutes, 54 seconds Radiation Exposure Index (if provided by the fluoroscopic device): 64.8 mGy Number of Acquired Spot Images: 6+ 2 video loops. FINDINGS: The patient was unable willing to drink more than approximately 100 cc of the 50 50 solution of Gastrografin and water. This allowed only limited visualization of the stomach. Contrast was seen to passed from the esophagus into the stomach and then into the duodenum. No definite extraluminal contrast collections were observed but the volume of contrast ingested (approximately 100 cc) was inadequate for thorough evaluation. IMPRESSION: Very limited study with inadequate contrast ingested for true exclusion of persistent gastric leak. Consideration could be given for performing an abdominal CT scan with water-soluble Gastroview which may be tolerated better by the patient. Electronically Signed   By: David  Martinique M.D.   On: 11/23/2017 11:26    EKG:   Orders placed or performed during the hospital encounter of 11/21/17  . ED EKG  . ED EKG  . EKG 12-Lead  . 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 we will use IV hydralazine as needed  #.  Coronary artery disease with no chest pain Cardiac enzymes negative EKG unrevealing Hold aspirin other cardiac meds for now until able to take oral p.o.  #.  Alzheimer's dementia-chronic  #  BPH resume Proscar when able to take oral      All the records are reviewed and case discussed with Care Management/Social Workerr. Management plans discussed with the patient, family and they are in agreement.  CODE STATUS:   TOTAL TIME TAKING CARE OF THIS PATIENT: 35  minutes.   POSSIBLE D/C IN 2-3 DAYS, DEPENDING ON CLINICAL CONDITION.  Note: This dictation was prepared with Dragon dictation along with smaller phrase technology. Any transcriptional errors that result from this process are unintentional.   Dustin Flock M.D on 11/24/2017 at 1:38 PM  Between 7am to 6pm - Pager - [phone] After 6pm go to [email or web address] - password [email or web address] [facility]  Office  [phone]  CC: Primary care physician; Patient, No Pcp Per

## 2017-11-25 ENCOUNTER — Inpatient Hospital Stay: Payer: Medicare HMO

## 2017-11-25 LAB — CBC
HCT: 33.6 % — ABNORMAL LOW (ref 40.0–52.0)
Hemoglobin: 11.5 g/dL — ABNORMAL LOW (ref 13.0–18.0)
MCH: 31.1 pg (ref 26.0–34.0)
MCHC: 34.2 g/dL (ref 32.0–36.0)
MCV: 90.7 fL (ref 80.0–100.0)
Platelets: 94 10*3/uL — ABNORMAL LOW (ref 150–440)
RBC: 3.71 MIL/uL — ABNORMAL LOW (ref 4.40–5.90)
RDW: 13.7 % (ref 11.5–14.5)
WBC: 8.6 10*3/uL (ref 3.8–10.6)

## 2017-11-25 LAB — BASIC METABOLIC PANEL
Anion gap: 4 — ABNORMAL LOW (ref 5–15)
BUN: 14 mg/dL (ref 8–23)
CO2: 29 mmol/L (ref 22–32)
Calcium: 7.9 mg/dL — ABNORMAL LOW (ref 8.9–10.3)
Chloride: 111 mmol/L (ref 98–111)
Creatinine, Ser: 1.54 mg/dL — ABNORMAL HIGH (ref 0.61–1.24)
GFR calc Af Amer: 48 mL/min — ABNORMAL LOW (ref >60)
GFR calc non Af Amer: 42 mL/min — ABNORMAL LOW (ref >60)
Glucose, Bld: 118 mg/dL — ABNORMAL HIGH (ref 70–99)
Potassium: 3.3 mmol/L — ABNORMAL LOW (ref 3.5–5.1)
Sodium: 144 mmol/L (ref 135–145)

## 2017-11-25 IMAGING — CT CT ANGIO ABDOMEN
2 of 6 series · 14 of 46 positions shown, 16 images · IV contrast (APPLIED)
Comparison: CT scan of the chest, abdomen and pelvis without
intravenous contrast [DATE]

CLINICAL DATA: 76-year-old male with gastric pneumatosis. Evaluate
for possible source for arterial embolus.

EXAM:
CT ANGIOGRAPHY CHEST, ABDOMEN AND PELVIS
TECHNIQUE: Multidetector CT imaging through the chest, abdomen and pelvis was
performed using the standard protocol during bolus administration of
intravenous contrast. Multiplanar reconstructed images and MIPs were
obtained and reviewed to evaluate the vascular anatomy.
CONTRAST:  100mL [K4] IOPAMIDOL ([K4]) INJECTION 76%

[Series 4: axial arterial · axial · arterial · 0.85mm/px · z∈[-452,-59]mm · 11 of 151 slices shown, 13 images]
[im 10/151  soft-tissue]
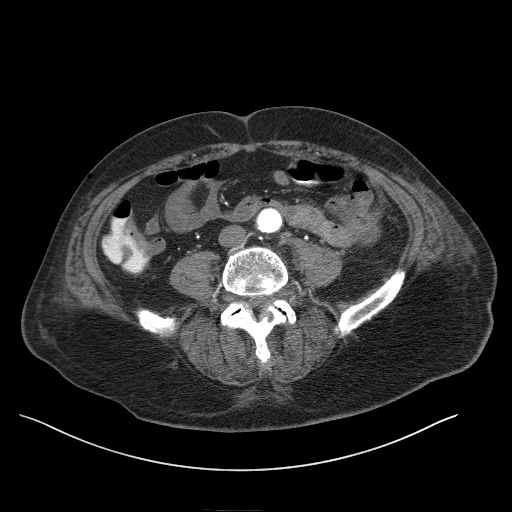
[im 10/151  bone]
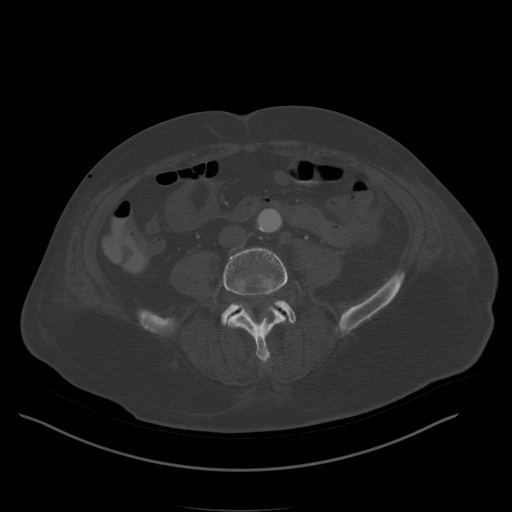
[im 29/151  soft-tissue]
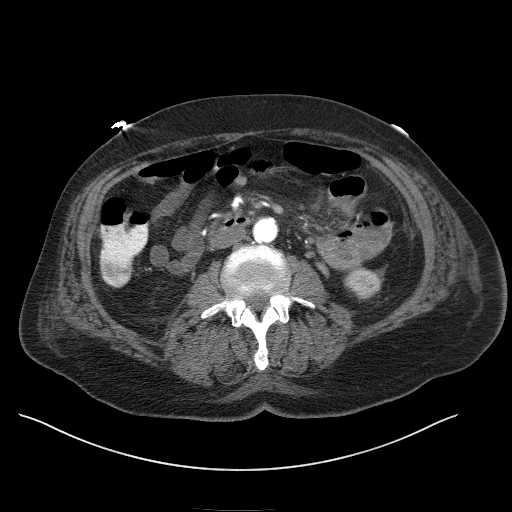
[im 38/151  soft-tissue]
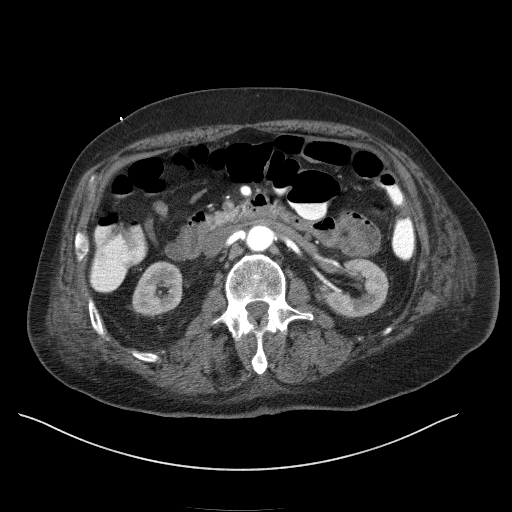
[im 47/151  soft-tissue]
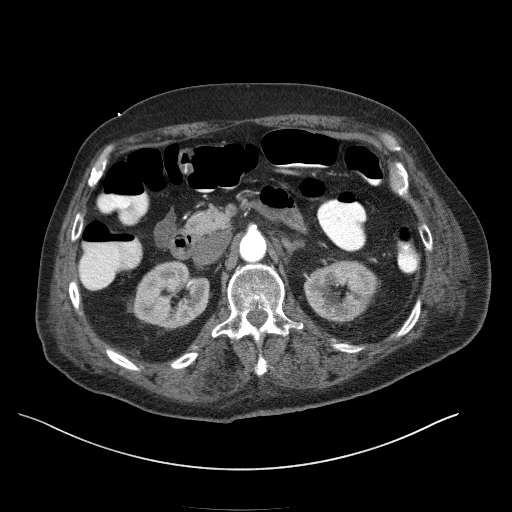
[im 66/151  soft-tissue]
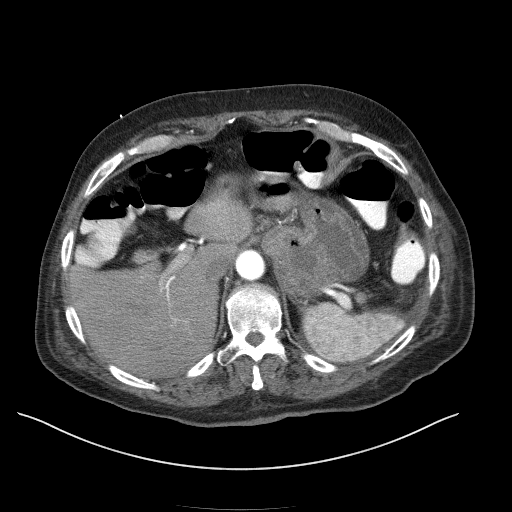
[im 76/151  soft-tissue]
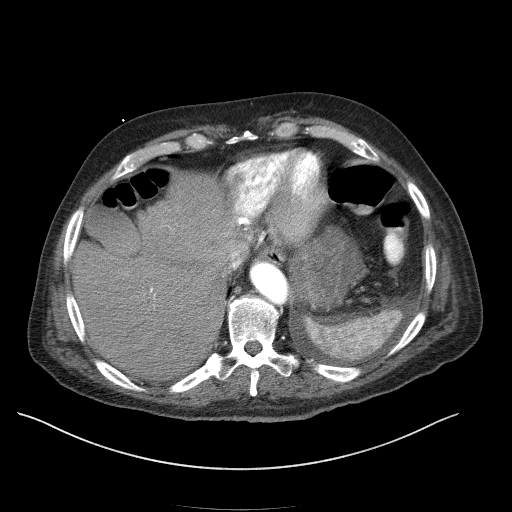
[im 85/151  soft-tissue]
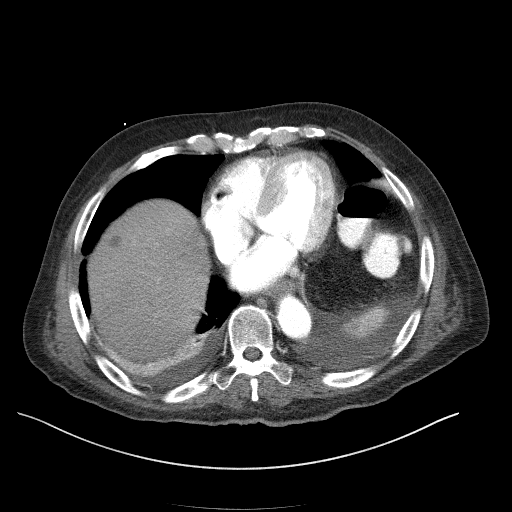
[im 104/151  soft-tissue]
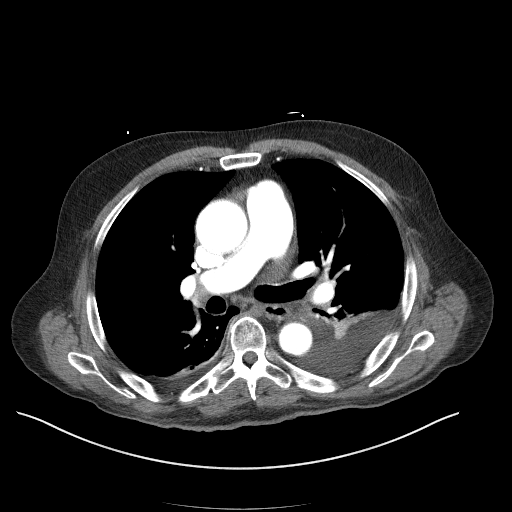
[im 113/151  soft-tissue]
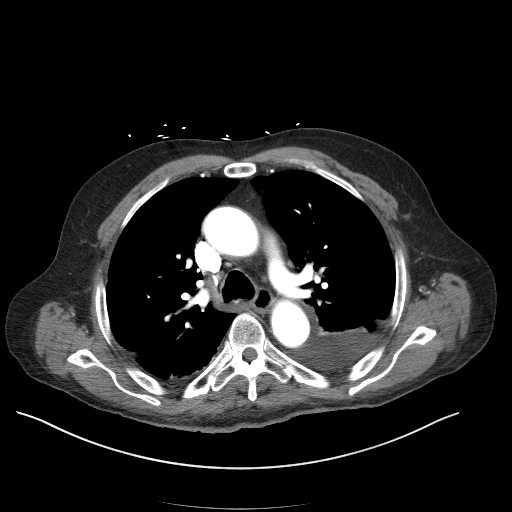
[im 113/151  bone]
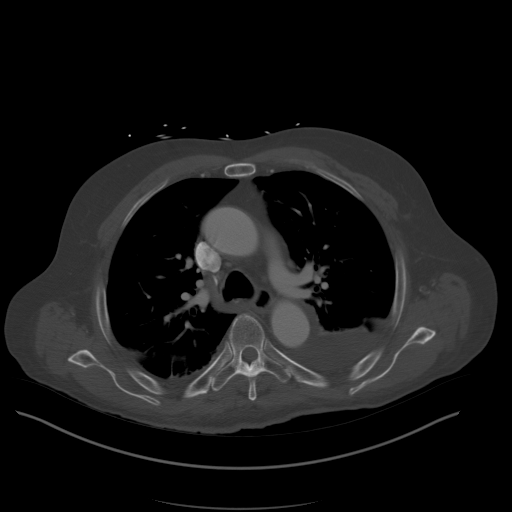
[im 122/151  soft-tissue]
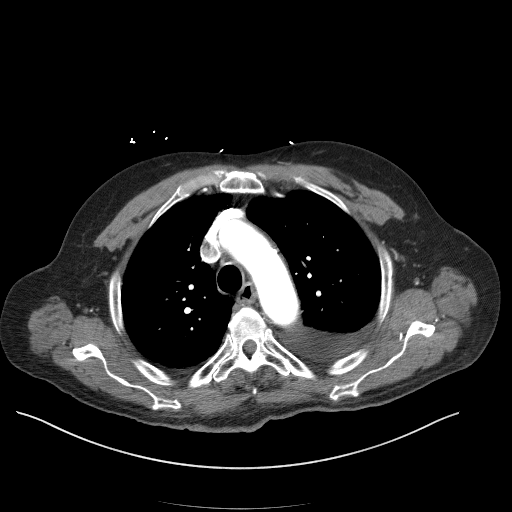
[im 141/151  soft-tissue]
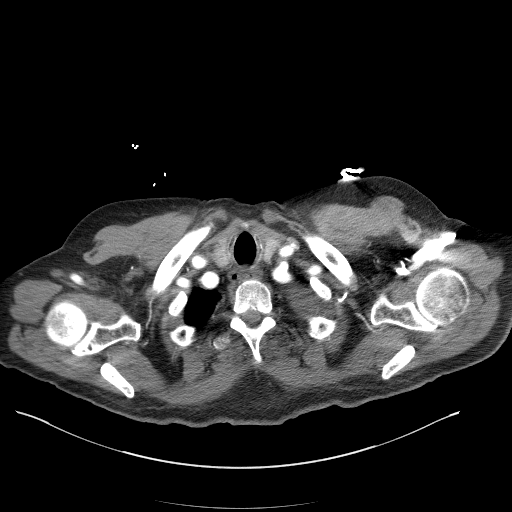

[Series 7: coronal · coronal · 0.83mm/px · 3 of 93 slices shown]
[im 24/93  soft-tissue]
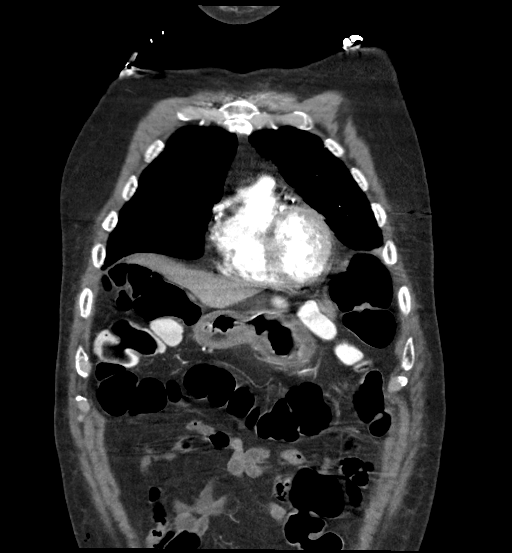
[im 47/93  soft-tissue]
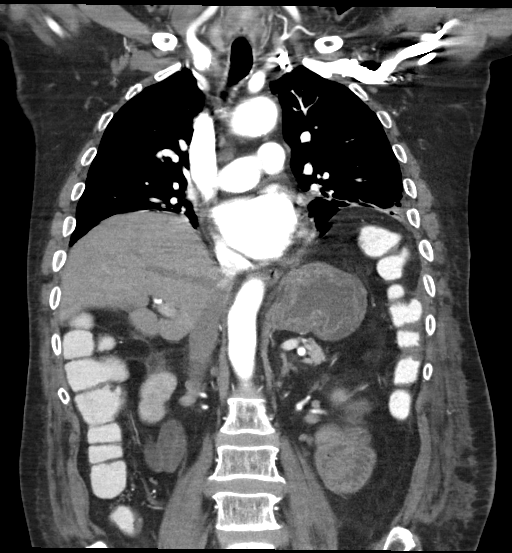
[im 70/93  soft-tissue]
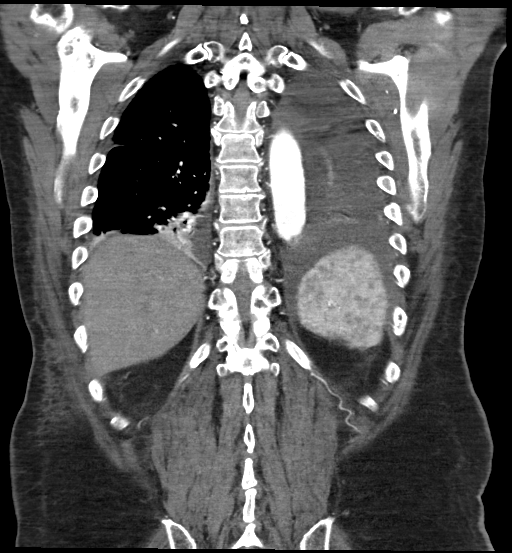

[14 of 46 positions shown; findings below may reference images not displayed]

FINDINGS: CTA CHEST FINDINGS

Cardiovascular: Conventional 3 vessel arch anatomy. Trace
atherosclerotic calcifications present. No irregular or ulcerated
atherosclerotic plaque. Fusiform aneurysmal dilatation of the
tubular portion of the ascending thoracic aorta with a maximal
diameter of 4.4 cm. No evidence of dissection. The aortic root is
normal in caliber. No effacement of the KANKER junction.
Calcifications are present throughout the coronary arteries. The
heart is normal in size. No evidence of thrombus within the left
atrial appendage. No pericardial thickening or effusion.

Mediastinum/Nodes: Unremarkable CT appearance of the thyroid gland.
No suspicious mediastinal or hilar adenopathy. No soft tissue
mediastinal mass. The thoracic esophagus is unremarkable.

Lungs/Pleura: Trace right pleural effusion. Small to moderate left
layering pleural effusion. Associated left lower lobe atelectasis.
No evidence of pulmonary edema.

Musculoskeletal: No acute fracture or aggressive appearing lytic or
blastic osseous lesion.

Review of the MIP images confirms the above findings.

CTA ABDOMEN AND PELVIS FINDINGS

VASCULAR

Aorta: Mild heterogeneous atherosclerotic plaque throughout the
abdominal aorta. No evidence of aneurysm or dissection.

Celiac: Patent without evidence of aneurysm, dissection, vasculitis
or significant stenosis.

SMA: Patent without evidence of aneurysm, dissection, vasculitis or
significant stenosis.

Renals: Both renal arteries are patent without evidence of aneurysm,
dissection, vasculitis, fibromuscular dysplasia or significant
stenosis.

IMA: Patent without evidence of aneurysm, dissection, vasculitis or
significant stenosis.

Inflow: Patent without evidence of aneurysm, dissection, vasculitis
or significant stenosis.

Veins: No obvious venous abnormality within the limitations of this
arterial phase study.

Review of the MIP images confirms the above findings.

NON-VASCULAR

Hepatobiliary: Interval resolution of portal venous gas. Stable
small circumscribed low-attenuation lesion in the hepatic dome
consistent with a benign cyst. Relative hypoplasia of hepatic
segment 4B, similar compared to prior imaging. High attenuation
material present within the gallbladder lumen likely represents
vicarious excretion. No intra or extrahepatic biliary ductal
dilatation.

Pancreas: Unremarkable. No pancreatic ductal dilatation or
surrounding inflammatory changes.

Spleen: Normal in size without focal abnormality.

Adrenals/Urinary Tract: Adrenal glands are unremarkable. Kidneys are
normal, without renal calculi, focal lesion, or hydronephrosis.

Stomach/Bowel: Interval resolution of pneumatosis from the gastric
wall. There is a suggestion of diffuse mild submucosal edema
throughout the stomach but no focal irregularity or ulceration. The
proximal duodenum is unremarkable. No evidence of bowel obstruction.
The visualized portions of the colon are unremarkable.

Lymphatic: No suspicious lymphadenopathy.

Other: No abdominal wall hernia or abnormality. Simple perisplenic
ascites. No evidence of loculation or peripheral rim enhancement to
suggest abscess.

Musculoskeletal: No acute fracture or aggressive appearing lytic or
blastic osseous lesion.

Review of the MIP images confirms the above findings.
IMPRESSION: 1. Mild multifocal predominantly calcified atherosclerotic plaque.
No irregular fibrofatty plaque, ulcerations or wall adherent mural
thrombus to suggest a potential embolic source. No evidence of
thrombus within the left atrial appendage. No evidence of additional
embolic phenomenon (no splenic or renal infarcts).
2. Interval resolution of posterior gastric wall pneumatosis. There
is some residual perisplenic ascites and a likely reactive small to
moderate left-sided pleural effusion which may represent sequelae
from the recent gastric process.
3. Stable thoracic aortic aneurysm with a maximal diameter of
cm. Recommend annual imaging followup by CTA or MRA. This
recommendation follows [K4]
ACCF/AHA/AATS/ACR/ASA/SCA/KANKER/KANKER/KANKER/KANKER Guidelines for the
Diagnosis and Management of Patients with Thoracic Aortic Disease.
Circulation. [K4]; 121: [id number]. Aortic aneurysm NOS
([K4]-[K4]); Aortic Atherosclerosis ([K4]-170.0)
4. Coronary artery calcifications.

## 2017-11-25 MED ORDER — IOPAMIDOL (ISOVUE-370) INJECTION 76%
100.0000 mL | Freq: Once | INTRAVENOUS | Status: AC | PRN
  Administered 2017-11-25: 100 mL via INTRAVENOUS

## 2017-11-25 MED ORDER — POTASSIUM CHLORIDE CRYS ER 20 MEQ PO TBCR
40.0000 meq | EXTENDED_RELEASE_TABLET | Freq: Once | ORAL | Status: AC
  Administered 2017-11-25: 40 meq via ORAL
  Filled 2017-11-25: qty 2

## 2017-11-25 MED ORDER — METOPROLOL SUCCINATE ER 25 MG PO TB24
25.0000 mg | ORAL_TABLET | Freq: Every day | ORAL | Status: DC
  Administered 2017-11-26: 25 mg via ORAL
  Filled 2017-11-25: qty 1

## 2017-11-25 NOTE — Progress Notes (Signed)
[facility] [place] NAME: Alex [name]    MR#:  [id number]  DATE OF BIRTH:  10/03/1940  SUBJECTIVE:  CHIEF COMPLAINT: Patient denies any abdominal pain wife at bedside  REVIEW OF SYSTEMS:  Review of system unobtainable as the patient has chronic baseline dementia  DRUG ALLERGIES:   Allergies  Allergen Reactions  . Penicillins Other (See Comments)    Unknown reaction, childhood  Has patient had a PCN reaction causing immediate rash, facial/tongue/throat swelling, SOB or lightheadedness with hypotension: Yes Has patient had a PCN reaction causing severe rash involving mucus membranes or skin necrosis: No Has patient had a PCN reaction that required hospitalization: No Has patient had a PCN reaction occurring within the last 10 years: No If all of the above answers are "NO", then may proceed with Cephalosporin use.     VITALS:  Blood pressure (!) 148/85, pulse 85, temperature 98.3 F (36.8 C), temperature source Oral, resp. rate 17, height 5\' 11"  (1.803 m), weight 95.1 kg, SpO2 97 %.  PHYSICAL EXAMINATION:  GENERAL:  77 y.o.-year-old patient lying in the bed with no acute distress.  EYES: Pupils equal, round, reactive to light and accommodation. No scleral icterus. Extraocular muscles intact.  HEENT: Head atraumatic, normocephalic. Oropharynx and nasopharynx clear.  NECK:  Supple, no jugular venous distention. No thyroid enlargement, no tenderness.  LUNGS: Normal breath sounds bilaterally, no wheezing, rales,rhonchi or crepitation. No use of accessory muscles of respiration.  CARDIOVASCULAR: S1, S2 normal. No murmurs, rubs, or gallops.  ABDOMEN: Soft, nontender, bowel sounds present.  EXTREMITIES: No pedal edema, cyanosis, or clubbing.  NEUROLOGIC: Arousable but disoriented gait not checked.  PSYCHIATRIC: The patient is arousable but disoriented SKIN: No obvious rash, lesion, or ulcer.    LABORATORY PANEL:   CBC Recent  Labs  Lab 11/25/17 0451  WBC 8.6  HGB 11.5*  HCT 33.6*  PLT 94*   ------------------------------------------------------------------------------------------------------------------  Chemistries  Recent Labs  Lab 11/25/17 0451  NA 144  K 3.3*  CL 111  CO2 29  GLUCOSE 118*  BUN 14  CREATININE 1.54*  CALCIUM 7.9*   ------------------------------------------------------------------------------------------------------------------  Cardiac Enzymes Recent Labs  Lab 11/21/17 0631  TROPONINI <0.03   ------------------------------------------------------------------------------------------------------------------  RADIOLOGY:  Ct Angio Abdomen W &/or Wo Contrast  Result Date: 11/25/2017 CLINICAL DATA:  77 year old male with gastric pneumatosis. Evaluate for possible source for arterial embolus. EXAM: CT ANGIOGRAPHY CHEST, ABDOMEN AND PELVIS TECHNIQUE: Multidetector CT imaging through the chest, abdomen and pelvis was performed using the standard protocol during bolus administration of intravenous contrast. Multiplanar reconstructed images and MIPs were obtained and reviewed to evaluate the vascular anatomy. CONTRAST:  140mL ISOVUE-370 IOPAMIDOL (ISOVUE-370) INJECTION 76% COMPARISON:  CT scan of the chest, abdomen and pelvis without intravenous contrast 11/21/2017 FINDINGS: CTA CHEST FINDINGS Cardiovascular: Conventional 3 vessel arch anatomy. Trace atherosclerotic calcifications present. No irregular or ulcerated atherosclerotic plaque. Fusiform aneurysmal dilatation of the tubular portion of the ascending thoracic aorta with a maximal diameter of 4.4 cm. No evidence of dissection. The aortic root is normal in caliber. No effacement of the sino-tubular junction. Calcifications are present throughout the coronary arteries. The heart is normal in size. No evidence of thrombus within the left atrial appendage. No pericardial thickening or effusion. Mediastinum/Nodes: Unremarkable CT appearance  of the thyroid gland. No suspicious mediastinal or hilar adenopathy. No soft tissue mediastinal mass. The thoracic esophagus is unremarkable. Lungs/Pleura: Trace right pleural effusion. Small to moderate left layering pleural effusion. Associated left  lower lobe atelectasis. No evidence of pulmonary edema. Musculoskeletal: No acute fracture or aggressive appearing lytic or blastic osseous lesion. Review of the MIP images confirms the above findings. CTA ABDOMEN AND PELVIS FINDINGS VASCULAR Aorta: Mild heterogeneous atherosclerotic plaque throughout the abdominal aorta. No evidence of aneurysm or dissection. Celiac: Patent without evidence of aneurysm, dissection, vasculitis or significant stenosis. SMA: Patent without evidence of aneurysm, dissection, vasculitis or significant stenosis. Renals: Both renal arteries are patent without evidence of aneurysm, dissection, vasculitis, fibromuscular dysplasia or significant stenosis. IMA: Patent without evidence of aneurysm, dissection, vasculitis or significant stenosis. Inflow: Patent without evidence of aneurysm, dissection, vasculitis or significant stenosis. Veins: No obvious venous abnormality within the limitations of this arterial phase study. Review of the MIP images confirms the above findings. NON-VASCULAR Hepatobiliary: Interval resolution of portal venous gas. Stable small circumscribed low-attenuation lesion in the hepatic dome consistent with a benign cyst. Relative hypoplasia of hepatic segment 4B, similar compared to prior imaging. High attenuation material present within the gallbladder lumen likely represents vicarious excretion. No intra or extrahepatic biliary ductal dilatation. Pancreas: Unremarkable. No pancreatic ductal dilatation or surrounding inflammatory changes. Spleen: Normal in size without focal abnormality. Adrenals/Urinary Tract: Adrenal glands are unremarkable. Kidneys are normal, without renal calculi, focal lesion, or hydronephrosis.  Stomach/Bowel: Interval resolution of pneumatosis from the gastric wall. There is a suggestion of diffuse mild submucosal edema throughout the stomach but no focal irregularity or ulceration. The proximal duodenum is unremarkable. No evidence of bowel obstruction. The visualized portions of the colon are unremarkable. Lymphatic: No suspicious lymphadenopathy. Other: No abdominal wall hernia or abnormality. Simple perisplenic ascites. No evidence of loculation or peripheral rim enhancement to suggest abscess. Musculoskeletal: No acute fracture or aggressive appearing lytic or blastic osseous lesion. Review of the MIP images confirms the above findings. IMPRESSION: 1. Mild multifocal predominantly calcified atherosclerotic plaque. No irregular fibrofatty plaque, ulcerations or wall adherent mural thrombus to suggest a potential embolic source. No evidence of thrombus within the left atrial appendage. No evidence of additional embolic phenomenon (no splenic or renal infarcts). 2. Interval resolution of posterior gastric wall pneumatosis. There is some residual perisplenic ascites and a likely reactive small to moderate left-sided pleural effusion which may represent sequelae from the recent gastric process. 3. Stable thoracic aortic aneurysm with a maximal diameter of 4.4 cm. Recommend annual imaging followup by CTA or MRA. This recommendation follows 2010 ACCF/AHA/AATS/ACR/ASA/SCA/SCAI/SIR/STS/SVM Guidelines for the Diagnosis and Management of Patients with Thoracic Aortic Disease. Circulation. 2010; 121: X324-M010. Aortic aneurysm NOS (ICD10-I71.9); Aortic Atherosclerosis (ICD10-170.0) 4. Coronary artery calcifications. Electronically Signed   By: Jacqulynn Cadet M.D.   On: 11/25/2017 14:42   Ct Angio Chest Aorta W/cm &/or Wo/cm  Result Date: 11/25/2017 CLINICAL DATA:  77 year old male with gastric pneumatosis. Evaluate for possible source for arterial embolus. EXAM: CT ANGIOGRAPHY CHEST, ABDOMEN AND PELVIS  TECHNIQUE: Multidetector CT imaging through the chest, abdomen and pelvis was performed using the standard protocol during bolus administration of intravenous contrast. Multiplanar reconstructed images and MIPs were obtained and reviewed to evaluate the vascular anatomy. CONTRAST:  140mL ISOVUE-370 IOPAMIDOL (ISOVUE-370) INJECTION 76% COMPARISON:  CT scan of the chest, abdomen and pelvis without intravenous contrast 11/21/2017 FINDINGS: CTA CHEST FINDINGS Cardiovascular: Conventional 3 vessel arch anatomy. Trace atherosclerotic calcifications present. No irregular or ulcerated atherosclerotic plaque. Fusiform aneurysmal dilatation of the tubular portion of the ascending thoracic aorta with a maximal diameter of 4.4 cm. No evidence of dissection. The aortic root is normal in caliber. No effacement of  the sino-tubular junction. Calcifications are present throughout the coronary arteries. The heart is normal in size. No evidence of thrombus within the left atrial appendage. No pericardial thickening or effusion. Mediastinum/Nodes: Unremarkable CT appearance of the thyroid gland. No suspicious mediastinal or hilar adenopathy. No soft tissue mediastinal mass. The thoracic esophagus is unremarkable. Lungs/Pleura: Trace right pleural effusion. Small to moderate left layering pleural effusion. Associated left lower lobe atelectasis. No evidence of pulmonary edema. Musculoskeletal: No acute fracture or aggressive appearing lytic or blastic osseous lesion. Review of the MIP images confirms the above findings. CTA ABDOMEN AND PELVIS FINDINGS VASCULAR Aorta: Mild heterogeneous atherosclerotic plaque throughout the abdominal aorta. No evidence of aneurysm or dissection. Celiac: Patent without evidence of aneurysm, dissection, vasculitis or significant stenosis. SMA: Patent without evidence of aneurysm, dissection, vasculitis or significant stenosis. Renals: Both renal arteries are patent without evidence of aneurysm, dissection,  vasculitis, fibromuscular dysplasia or significant stenosis. IMA: Patent without evidence of aneurysm, dissection, vasculitis or significant stenosis. Inflow: Patent without evidence of aneurysm, dissection, vasculitis or significant stenosis. Veins: No obvious venous abnormality within the limitations of this arterial phase study. Review of the MIP images confirms the above findings. NON-VASCULAR Hepatobiliary: Interval resolution of portal venous gas. Stable small circumscribed low-attenuation lesion in the hepatic dome consistent with a benign cyst. Relative hypoplasia of hepatic segment 4B, similar compared to prior imaging. High attenuation material present within the gallbladder lumen likely represents vicarious excretion. No intra or extrahepatic biliary ductal dilatation. Pancreas: Unremarkable. No pancreatic ductal dilatation or surrounding inflammatory changes. Spleen: Normal in size without focal abnormality. Adrenals/Urinary Tract: Adrenal glands are unremarkable. Kidneys are normal, without renal calculi, focal lesion, or hydronephrosis. Stomach/Bowel: Interval resolution of pneumatosis from the gastric wall. There is a suggestion of diffuse mild submucosal edema throughout the stomach but no focal irregularity or ulceration. The proximal duodenum is unremarkable. No evidence of bowel obstruction. The visualized portions of the colon are unremarkable. Lymphatic: No suspicious lymphadenopathy. Other: No abdominal wall hernia or abnormality. Simple perisplenic ascites. No evidence of loculation or peripheral rim enhancement to suggest abscess. Musculoskeletal: No acute fracture or aggressive appearing lytic or blastic osseous lesion. Review of the MIP images confirms the above findings. IMPRESSION: 1. Mild multifocal predominantly calcified atherosclerotic plaque. No irregular fibrofatty plaque, ulcerations or wall adherent mural thrombus to suggest a potential embolic source. No evidence of thrombus  within the left atrial appendage. No evidence of additional embolic phenomenon (no splenic or renal infarcts). 2. Interval resolution of posterior gastric wall pneumatosis. There is some residual perisplenic ascites and a likely reactive small to moderate left-sided pleural effusion which may represent sequelae from the recent gastric process. 3. Stable thoracic aortic aneurysm with a maximal diameter of 4.4 cm. Recommend annual imaging followup by CTA or MRA. This recommendation follows 2010 ACCF/AHA/AATS/ACR/ASA/SCA/SCAI/SIR/STS/SVM Guidelines for the Diagnosis and Management of Patients with Thoracic Aortic Disease. Circulation. 2010; 121: V616-W737. Aortic aneurysm NOS (ICD10-I71.9); Aortic Atherosclerosis (ICD10-170.0) 4. Coronary artery calcifications. Electronically Signed   By: Jacqulynn Cadet M.D.   On: 11/25/2017 14:42    EKG:   Orders placed or performed during the hospital encounter of 11/21/17  . ED EKG  . ED EKG  . EKG 12-Lead  . EKG 12-Lead    ASSESSMENT AND PLAN:   Patient 77 year old with history of coronary artery disease possible congestive heart failure presenting with abdominal pain  # acute  Abdominal pain due to posterior gastric wall infarct with gastric pneumatosis CTA of the abdomen shows resolution of  the pneumatosis Further therapy per surgery, plan is for conservative management for now Further recommendations per surgery   #.Acute kidney injury on chronic kidney disease stage III Hydrate with IV fluids, avoid nephrotoxins Appreciate nephrology input Baseline creatinine 1.2  #  Essential hypertension  patient being n.p.o. we will use IV hydralazine as needed  #.  Coronary artery disease with no chest pain Cardiac enzymes negative EKG unrevealing Hold aspirin other cardiac meds for now until able to take oral p.o.  #.  Alzheimer's dementia-chronic  #  BPH resume Proscar when able to take oral      All the records are reviewed and case  discussed with Care Management/Social Workerr. Management plans discussed with the patient, family and they are in agreement.  CODE STATUS:   TOTAL TIME TAKING CARE OF THIS PATIENT: 32  minutes.   POSSIBLE D/C IN 2-3 DAYS, DEPENDING ON CLINICAL CONDITION.  Note: This dictation was prepared with Dragon dictation along with smaller phrase technology. Any transcriptional errors that result from this process are unintentional.   Dustin Flock M.D on 11/25/2017 at 3:28 PM  Between 7am to 6pm - Pager - [phone] After 6pm go to [email or web address] - password [email or web address] [facility]  Office  [phone]  CC: Primary care physician; Patient, No Pcp Per

## 2017-11-25 NOTE — Progress Notes (Signed)
Pharmacy Antibiotic Note  Alex [name] O66 y.o.maleadmittedon 8/26/2019with intra-abdominal infection.Patientpreviouslyon ciprofloxacin and metronidazole. Pharmacy consulted for meropenemdosing for broad spectrum coverage w/ spike in patient's WBC. WBC is now trending back down.  Plan: Continue meropenem 1 g IV q12h  Height: 5\' 11"  (180.3 cm) Weight: 209 lb 10.5 oz (95.1 kg) IBW/kg (Calculated) : 75.3  Temp (24hrs), Avg:98.7 F (37.1 C), Min:98.3 F (36.8 C), Max:99 F (37.2 C)  Recent Labs  Lab 11/21/17 0142 11/21/17 0223 11/22/17 0356 11/22/17 1129 11/22/17 1432 11/23/17 0413 11/24/17 0435 11/25/17 0451  WBC 14.5*  --  20.2*  --   --  15.8* 11.6* 8.6  CREATININE  --  1.07 1.65*  --   --  1.63* 1.59* 1.54*  LATICACIDVEN  --   --   --  3.4* 3.6*  --   --   --     Estimated Creatinine Clearance: 47.3 mL/min (A) (by C-G formula based on SCr of 1.54 mg/dL (H)).    Allergies  Allergen Reactions  . Penicillins Other (See Comments)    Unknown reaction, childhood  Has patient had a PCN reaction causing immediate rash, facial/tongue/throat swelling, SOB or lightheadedness with hypotension: Yes Has patient had a PCN reaction causing severe rash involving mucus membranes or skin necrosis: No Has patient had a PCN reaction that required hospitalization: No Has patient had a PCN reaction occurring within the last 10 years: No If all of the above answers are "NO", then may proceed with Cephalosporin use.     Antimicrobials this admission: Ciprofloxacin 8/26>>8/27 Flagyl 8/26>>8/27 Meropenem 8/27 >>   Thank you for allowing pharmacy to be a part of this patient's care.  Tawnya Crook, PharmD Pharmacy Resident  11/25/2017 3:19 PM

## 2017-11-25 NOTE — Progress Notes (Signed)
[facility]  ROUNDING NOTE   Subjective:   Family at bedside.   CT with contrast scheduled - D5LR at 161mL/hr started.   Objective:  Vital signs in last 24 hours:  Temp:  [98.7 F (37.1 C)-99 F (37.2 C)] 99 F (37.2 C) (08/30 0354) Pulse Rate:  [92-99] 99 (08/30 0354) Resp:  [20] 20 (08/30 0354) BP: (142-143)/(87-93) 143/87 (08/30 0354) SpO2:  [94 %-100 %] 94 % (08/30 0354)  Weight change:  Filed Weights   11/21/17 1003  Weight: 95.1 kg    Intake/Output: I/O last 3 completed shifts: In: 3090.3 [I.V.:2890.3; IV Piggyback:200.1] Out: 150 [Urine:150]   Intake/Output this shift:  No intake/output data recorded.  Physical Exam: General: NAD, laying in bed  Head: Normocephalic, atraumatic. Moist oral mucosal membranes  Eyes: Anicteric, PERRL  Neck: Supple, trachea midline  Lungs:  Clear to auscultation  Heart: Regular rate and rhythm  Abdomen:  Soft, nontender, +bowel sounds  Extremities:  no peripheral edema.  Neurologic: Alert to self, moving all four extremities  Skin: No lesions        Basic Metabolic Panel: Recent Labs  Lab 11/21/17 0223 11/22/17 0356 11/23/17 0413 11/24/17 0435 11/25/17 0451  NA 143 139 142 144 144  K 3.6 4.1 4.0 3.6 3.3*  CL 113* 107 108 111 111  CO2 22 25 25 27 29   GLUCOSE 90 141* 143* 114* 118*  BUN 11 24* 25* 21 14  CREATININE 1.07 1.65* 1.63* 1.59* 1.54*  CALCIUM 6.6* 8.1* 8.1* 8.0* 7.9*    Liver Function Tests: No results for input(s): AST, ALT, ALKPHOS, BILITOT, PROT, ALBUMIN in the last 168 hours. No results for input(s): LIPASE, AMYLASE in the last 168 hours. No results for input(s): AMMONIA in the last 168 hours.  CBC: Recent Labs  Lab 11/21/17 0142 11/22/17 0356 11/23/17 0413 11/24/17 0435 11/25/17 0451  WBC 14.5* 20.2* 15.8* 11.6* 8.6  HGB 14.3 15.6 14.8 12.7* 11.5*  HCT 41.9 45.9 43.7 37.6* 33.6*  MCV 90.6 90.0 91.2 90.0 90.7  PLT 109* 118* 102* 95* 94*    Cardiac Enzymes: Recent Labs   Lab 11/21/17 0223 11/21/17 0631  TROPONINI <0.03 <0.03    BNP: Invalid input(s): POCBNP  CBG: No results for input(s): GLUCAP in the last 168 hours.  Microbiology: Results for orders placed or performed during the hospital encounter of 08/15/16  Urine culture     Status: None   Collection Time: 08/15/16  7:19 PM  Result Value Ref Range Status   Specimen Description URINE, RANDOM  Final   Special Requests NONE  Final   Culture   Final    NO GROWTH Performed at [facility], [place] [address] [place], [place] [address]    Report Status 08/17/2016 FINAL  Final    Coagulation Studies: No results for input(s): LABPROT, INR in the last 72 hours.  Urinalysis: No results for input(s): COLORURINE, LABSPEC, PHURINE, GLUCOSEU, HGBUR, BILIRUBINUR, KETONESUR, PROTEINUR, UROBILINOGEN, NITRITE, LEUKOCYTESUR in the last 72 hours.  Invalid input(s): APPERANCEUR    Imaging: No results found.   Medications:   . dextrose 5% lactated ringers 125 mL/hr at 11/25/17 0813  . meropenem (MERREM) IV 1 g (11/25/17 0532)   . calcium carbonate  1 tablet Oral Daily  . enoxaparin (LOVENOX) injection  40 mg Subcutaneous Q24H  . feeding supplement  1 Container Oral TID WC  . ondansetron (ZOFRAN) IV  4 mg Intravenous Once  . pantoprazole  40 mg Intravenous Q12H  haloperidol lactate, hydrALAZINE, metoprolol tartrate, morphine injection **OR** morphine injection **OR** morphine injection, ondansetron **OR** ondansetron (ZOFRAN) IV  Assessment/ Plan:  [name] is a 77 y.o. black male with Alzheimer's dementia, hyeprtension, BPH, coronary artery disease, hyperlipidemia, who was admitted to [facility] on 11/21/2017 for Pneumatosis of intestines [K63.89]  1. Acute renal failure 2. Chronic kidney disease stage III 3. Hypertension:  4. Hematuria  Impression Baseline creatinine of 1.2, GFR of 55 on 04/27/17.  Now with IV contrast scheduled.  Agree with IV fluids and holding ibuprofen and  furosemide.  Appreciate surgery input.    LOS: 4 Alex [name] 8/30/201912:09 PM

## 2017-11-25 NOTE — Progress Notes (Signed)
   11/25/17 1405  Clinical Encounter Type  Visited With Family  Visit Type Initial;Spiritual support  Referral From Patient  Consult/Referral To Chaplain  Spiritual Encounters  Spiritual Needs Emotional   CH met the patient's wife in the waiting area. When she realized that I and [place] were [place] she wanted to talk about her husband and concerns for family dynamics. The patient's wife feels that the patient's sister is being inappropriate towards her. I offered support and emotional encouragement.

## 2017-11-25 NOTE — Progress Notes (Addendum)
SURGICAL PROGRESS NOTE(cpt: [phone])  Patient seen and examined as described below with surgical PA-C, Ardell Isaacs.  Assessment/Plan: (ICD-10's:K55.069) 77 y.o.malewith gastric infarct of unclear etiology and improving leukocytosis, complicated by comorbidities includingadvanced chronological age, HTN, CAD, alzheimer's dementia, and BPHwith overall guarded prognosis.   - continue clear liquids diet for now - continue IV fluids, particularly prior to contrast - echocardiogram reviewed as part of embolic workup             - discussed with radiology decreased IV contrast protocol for CTA chest and abdomen to evaluate ascending thoracic aortic aneurysm for thrombus and reassess stomach, to be performed today - continue PPI infusion with antibiotics, previously broadened to meropenem  - follow-up repeat WBC and Cr (now baseline), monitor abdominal exam and bowel function - medical management of comorbidities per medical team - DVT prophylaxis, ambulation encouraged  I have personally reviewed the patient's chart, evaluated/examined the patient, proposed the recommended management, and discussed these recommendations with the patient and his family to their expressed satisfaction as well as with patient's RN.  -- Marilynne Drivers. Rosana Hoes, MD, [place]: [facility] - Partnering for exceptional care. Office: [phone]  [facility] Progress Note     Subjective: Pt resting comfortably in bed this morning with wife at bedside. No complaints of abdominal pain. He had some of the Boost Breeze supplement drinks yesterday without nausea or emesis. Family is unsure exactly how much he drank. No fevers.   Objective: Vital signs in last 24 hours: Temp:  [98.7 F (37.1 C)-99 F (37.2 C)] 99 F (37.2 C) (08/30 0354) Pulse Rate:  [92-99] 99 (08/30 0354) Resp:   [20] 20 (08/30 0354) BP: (142-143)/(87-93) 143/87 (08/30 0354) SpO2:  [94 %-100 %] 94 % (08/30 0354) Last BM Date: 11/21/17  Intake/Output from previous day: 08/29 0701 - 08/30 0700 In: 3090.3 [I.V.:2890.3; IV Piggyback:200.1] Out: 150 [Urine:150] Intake/Output this shift: No intake/output data recorded.  PE: Gen:  Alert, NAD, pleasant Card:  Regular rate and rhythm, pedal pulses 2+ BL Pulm:  Normal effort, clear to auscultation bilaterally Abd: Soft, non-tender, non-distended MSK: No peripheral edema, SCDs in place Skin: warm and dry, no rashes  Psych: Alert and Orient to self only  Lab Results:  Recent Labs    11/24/17 0435 11/25/17 0451  WBC 11.6* 8.6  HGB 12.7* 11.5*  HCT 37.6* 33.6*  PLT 95* 94*   BMET Recent Labs    11/24/17 0435 11/25/17 0451  NA 144 144  K 3.6 3.3*  CL 111 111  CO2 27 29  GLUCOSE 114* 118*  BUN 21 14  CREATININE 1.59* 1.54*  CALCIUM 8.0* 7.9*   PT/INR No results for input(s): LABPROT, INR in the last 72 hours. CMP     Component Value Date/Time   NA 144 11/25/2017 0451   K 3.3 (L) 11/25/2017 0451   CL 111 11/25/2017 0451   CO2 29 11/25/2017 0451   GLUCOSE 118 (H) 11/25/2017 0451   BUN 14 11/25/2017 0451   CREATININE 1.54 (H) 11/25/2017 0451   CALCIUM 7.9 (L) 11/25/2017 0451   PROT 7.4 04/27/2017 2035   ALBUMIN 4.2 04/27/2017 2035   AST 24 04/27/2017 2035   ALT 13 (L) 04/27/2017 2035   ALKPHOS 67 04/27/2017 2035   BILITOT 0.6 04/27/2017 2035   GFRNONAA 42 (L) 11/25/2017 0451   GFRAA 48 (L) 11/25/2017 0451   Lipase  No results found for: LIPASE     Studies/Results: Dg Ugi  W/water Sol Cm  Result Date: 11/23/2017 CLINICAL DATA:  Recent gastric perforation. Assess for evidence of a persistently. EXAM: WATER SOLUBLE UPPER GI SERIES TECHNIQUE: Single-column upper GI series was performed using water soluble contrast. CONTRAST:  1/2 strength Gastrografin was employed. COMPARISON:  Portable abdominal radiograph of today at 5:40  a.m. FLUOROSCOPY TIME:  Fluoroscopy Time:  1 minutes, 54 seconds Radiation Exposure Index (if provided by the fluoroscopic device): 64.8 mGy Number of Acquired Spot Images: 6+ 2 video loops. FINDINGS: The patient was unable willing to drink more than approximately 100 cc of the 50 50 solution of Gastrografin and water. This allowed only limited visualization of the stomach. Contrast was seen to passed from the esophagus into the stomach and then into the duodenum. No definite extraluminal contrast collections were observed but the volume of contrast ingested (approximately 100 cc) was inadequate for thorough evaluation. IMPRESSION: Very limited study with inadequate contrast ingested for true exclusion of persistent gastric leak. Consideration could be given for performing an abdominal CT scan with water-soluble Gastroview which may be tolerated better by the patient. Electronically Signed   By: David  Martinique M.D.   On: 11/23/2017 11:26    Anti-infectives: Anti-infectives (From admission, onward)   Start     Dose/Rate Route Frequency Ordered Stop   11/22/17 1800  meropenem (MERREM) 1 g in sodium chloride 0.9 % 100 mL IVPB  Status:  Discontinued     1 g 200 mL/hr over 30 Minutes Intravenous Every 12 hours 11/22/17 1438 11/22/17 1545   11/22/17 1600  meropenem (MERREM) 1 g in sodium chloride 0.9 % 100 mL IVPB     1 g 200 mL/hr over 30 Minutes Intravenous Every 12 hours 11/22/17 1545     11/21/17 1800  ciprofloxacin (CIPRO) IVPB 400 mg  Status:  Discontinued     400 mg 200 mL/hr over 60 Minutes Intravenous Every 12 hours 11/21/17 1012 11/22/17 1347   11/21/17 1400  metroNIDAZOLE (FLAGYL) IVPB 500 mg  Status:  Discontinued     500 mg 100 mL/hr over 60 Minutes Intravenous Every 8 hours 11/21/17 1012 11/22/17 1438   11/21/17 0700  ciprofloxacin (CIPRO) IVPB 400 mg     400 mg 200 mL/hr over 60 Minutes Intravenous  Once 11/21/17 0659 11/21/17 0816   11/21/17 0700  metroNIDAZOLE (FLAGYL) IVPB 500 mg      500 mg 100 mL/hr over 60 Minutes Intravenous  Once 11/21/17 1497 11/21/17 0263       Assessment/Plan  Gastric Pneumatosis - Abdominal examination continues to be unremarkable, given improvement in lab results there continues to be no indication for surgical intervention at this time. .  - Will obtain CTA of chest and Abdomen this morning to access for embolic source of potential gastric infarct. IV access is difficult. IV Team has been consulted. .   - Remain NPO at this time except Boost Breeze, will consider moving to liquid diet today.  - Leukocytosis improved on CBC this morning, continue meropenem. Will continue to follow - Renal function stable this morning, he may have returned to his baseline. Nephrology consulted and following, we appreciate their input. Continue IVF, will continue to reassess - Medicine following for chronic medical conditions, appreciate their help. - Continue protective PPI, DVT Prophylaxis     LOS: 4 days    Edison Simon , PA-C [facility] 11/25/2017, 8:46 AM [phone] M-F: 7am - 4pm

## 2017-11-26 LAB — RENAL FUNCTION PANEL
Albumin: 2.6 g/dL — ABNORMAL LOW (ref 3.5–5.0)
Anion gap: 7 (ref 5–15)
BUN: 11 mg/dL (ref 8–23)
CO2: 27 mmol/L (ref 22–32)
Calcium: 8 mg/dL — ABNORMAL LOW (ref 8.9–10.3)
Chloride: 108 mmol/L (ref 98–111)
Creatinine, Ser: 1.43 mg/dL — ABNORMAL HIGH (ref 0.61–1.24)
GFR calc Af Amer: 53 mL/min — ABNORMAL LOW (ref >60)
GFR calc non Af Amer: 46 mL/min — ABNORMAL LOW (ref >60)
Glucose, Bld: 124 mg/dL — ABNORMAL HIGH (ref 70–99)
Phosphorus: 2.8 mg/dL (ref 2.5–4.6)
Potassium: 3.5 mmol/L (ref 3.5–5.1)
Sodium: 142 mmol/L (ref 135–145)

## 2017-11-26 LAB — CBC
HCT: 31.8 % — ABNORMAL LOW (ref 40.0–52.0)
Hemoglobin: 11.1 g/dL — ABNORMAL LOW (ref 13.0–18.0)
MCH: 31.7 pg (ref 26.0–34.0)
MCHC: 35 g/dL (ref 32.0–36.0)
MCV: 90.7 fL (ref 80.0–100.0)
Platelets: 101 10*3/uL — ABNORMAL LOW (ref 150–440)
RBC: 3.51 MIL/uL — ABNORMAL LOW (ref 4.40–5.90)
RDW: 13.8 % (ref 11.5–14.5)
WBC: 7.7 10*3/uL (ref 3.8–10.6)

## 2017-11-26 MED ORDER — FINASTERIDE 5 MG PO TABS
5.0000 mg | ORAL_TABLET | Freq: Every day | ORAL | Status: DC
  Administered 2017-11-26 – 2017-11-28 (×3): 5 mg via ORAL
  Filled 2017-11-26 (×3): qty 1

## 2017-11-26 MED ORDER — PRAVASTATIN SODIUM 40 MG PO TABS
80.0000 mg | ORAL_TABLET | Freq: Every day | ORAL | Status: DC
  Administered 2017-11-26 – 2017-11-28 (×3): 80 mg via ORAL
  Filled 2017-11-26 (×2): qty 4
  Filled 2017-11-26 (×3): qty 2
  Filled 2017-11-26: qty 4

## 2017-11-26 MED ORDER — RIVASTIGMINE TARTRATE 3 MG PO CAPS
6.0000 mg | ORAL_CAPSULE | Freq: Two times a day (BID) | ORAL | Status: DC
  Administered 2017-11-26 – 2017-11-27 (×3): 6 mg via ORAL
  Filled 2017-11-26 (×4): qty 2

## 2017-11-26 MED ORDER — METOPROLOL SUCCINATE ER 25 MG PO TB24
25.0000 mg | ORAL_TABLET | Freq: Every day | ORAL | Status: DC
  Administered 2017-11-27 – 2017-11-28 (×2): 25 mg via ORAL
  Filled 2017-11-26 (×2): qty 1

## 2017-11-26 MED ORDER — METOPROLOL SUCCINATE ER 25 MG PO TB24: 25.0000 mg | ORAL_TABLET | Freq: Every day | ORAL | Status: DC

## 2017-11-26 MED ORDER — AMLODIPINE BESYLATE 5 MG PO TABS
5.0000 mg | ORAL_TABLET | Freq: Every day | ORAL | Status: DC
  Administered 2017-11-26 – 2017-11-28 (×3): 5 mg via ORAL
  Filled 2017-11-26 (×3): qty 1

## 2017-11-26 NOTE — Progress Notes (Signed)
Dr Peyton Najjar notified pt wheezing, orders given.

## 2017-11-26 NOTE — Progress Notes (Addendum)
[facility] [place] NAME: Alex [name]    MR#:  [id number]  DATE OF BIRTH:  [birth year]  SUBJECTIVE:  CHIEF COMPLAINT: Patient denying any abdominal pain CT shows improvement  REVIEW OF SYSTEMS:  Review of system unobtainable as the patient has chronic baseline dementia  DRUG ALLERGIES:   Allergies  Allergen Reactions  . Penicillins Other (See Comments)    Unknown reaction, childhood  Has patient had a PCN reaction causing immediate rash, facial/tongue/throat swelling, SOB or lightheadedness with hypotension: Yes Has patient had a PCN reaction causing severe rash involving mucus membranes or skin necrosis: No Has patient had a PCN reaction that required hospitalization: No Has patient had a PCN reaction occurring within the last 10 years: No If all of the above answers are "NO", then may proceed with Cephalosporin use.     VITALS:  Blood pressure (!) 151/101, pulse 79, temperature 98.5 F (36.9 C), temperature source Oral, resp. rate 18, height 5\' 11"  (1.803 m), weight 95.1 kg, SpO2 95 %.  PHYSICAL EXAMINATION:  GENERAL:  77 y.o.-year-old patient lying in the bed with no acute distress.  EYES: Pupils equal, round, reactive to light and accommodation. No scleral icterus. Extraocular muscles intact.  HEENT: Head atraumatic, normocephalic. Oropharynx and nasopharynx clear.  NECK:  Supple, no jugular venous distention. No thyroid enlargement, no tenderness.  LUNGS: Normal breath sounds bilaterally, no wheezing, rales,rhonchi or crepitation. No use of accessory muscles of respiration.  CARDIOVASCULAR: S1, S2 normal. No murmurs, rubs, or gallops.  ABDOMEN: Soft, nontender, bowel sounds present.  EXTREMITIES: No pedal edema, cyanosis, or clubbing.  NEUROLOGIC: Arousable but disoriented gait not checked.  PSYCHIATRIC: The patient is arousable but disoriented SKIN: No obvious rash, lesion, or ulcer.    LABORATORY PANEL:    CBC Recent Labs  Lab 11/26/17 0403  WBC 7.7  HGB 11.1*  HCT 31.8*  PLT 101*   ------------------------------------------------------------------------------------------------------------------  Chemistries  Recent Labs  Lab 11/26/17 0941  NA 142  K 3.5  CL 108  CO2 27  GLUCOSE 124*  BUN 11  CREATININE 1.43*  CALCIUM 8.0*   ------------------------------------------------------------------------------------------------------------------  Cardiac Enzymes Recent Labs  Lab 11/21/17 0631  TROPONINI <0.03   ------------------------------------------------------------------------------------------------------------------  RADIOLOGY:  Ct Angio Abdomen W &/or Wo Contrast  Result Date: 11/25/2017 CLINICAL DATA:  77 year old male with gastric pneumatosis. Evaluate for possible source for arterial embolus. EXAM: CT ANGIOGRAPHY CHEST, ABDOMEN AND PELVIS TECHNIQUE: Multidetector CT imaging through the chest, abdomen and pelvis was performed using the standard protocol during bolus administration of intravenous contrast. Multiplanar reconstructed images and MIPs were obtained and reviewed to evaluate the vascular anatomy. CONTRAST:  162mL ISOVUE-370 IOPAMIDOL (ISOVUE-370) INJECTION 76% COMPARISON:  CT scan of the chest, abdomen and pelvis without intravenous contrast 11/21/2017 FINDINGS: CTA CHEST FINDINGS Cardiovascular: Conventional 3 vessel arch anatomy. Trace atherosclerotic calcifications present. No irregular or ulcerated atherosclerotic plaque. Fusiform aneurysmal dilatation of the tubular portion of the ascending thoracic aorta with a maximal diameter of 4.4 cm. No evidence of dissection. The aortic root is normal in caliber. No effacement of the sino-tubular junction. Calcifications are present throughout the coronary arteries. The heart is normal in size. No evidence of thrombus within the left atrial appendage. No pericardial thickening or effusion. Mediastinum/Nodes:  Unremarkable CT appearance of the thyroid gland. No suspicious mediastinal or hilar adenopathy. No soft tissue mediastinal mass. The thoracic esophagus is unremarkable. Lungs/Pleura: Trace right pleural effusion. Small to moderate left layering pleural effusion. Associated left  lower lobe atelectasis. No evidence of pulmonary edema. Musculoskeletal: No acute fracture or aggressive appearing lytic or blastic osseous lesion. Review of the MIP images confirms the above findings. CTA ABDOMEN AND PELVIS FINDINGS VASCULAR Aorta: Mild heterogeneous atherosclerotic plaque throughout the abdominal aorta. No evidence of aneurysm or dissection. Celiac: Patent without evidence of aneurysm, dissection, vasculitis or significant stenosis. SMA: Patent without evidence of aneurysm, dissection, vasculitis or significant stenosis. Renals: Both renal arteries are patent without evidence of aneurysm, dissection, vasculitis, fibromuscular dysplasia or significant stenosis. IMA: Patent without evidence of aneurysm, dissection, vasculitis or significant stenosis. Inflow: Patent without evidence of aneurysm, dissection, vasculitis or significant stenosis. Veins: No obvious venous abnormality within the limitations of this arterial phase study. Review of the MIP images confirms the above findings. NON-VASCULAR Hepatobiliary: Interval resolution of portal venous gas. Stable small circumscribed low-attenuation lesion in the hepatic dome consistent with a benign cyst. Relative hypoplasia of hepatic segment 4B, similar compared to prior imaging. High attenuation material present within the gallbladder lumen likely represents vicarious excretion. No intra or extrahepatic biliary ductal dilatation. Pancreas: Unremarkable. No pancreatic ductal dilatation or surrounding inflammatory changes. Spleen: Normal in size without focal abnormality. Adrenals/Urinary Tract: Adrenal glands are unremarkable. Kidneys are normal, without renal calculi, focal  lesion, or hydronephrosis. Stomach/Bowel: Interval resolution of pneumatosis from the gastric wall. There is a suggestion of diffuse mild submucosal edema throughout the stomach but no focal irregularity or ulceration. The proximal duodenum is unremarkable. No evidence of bowel obstruction. The visualized portions of the colon are unremarkable. Lymphatic: No suspicious lymphadenopathy. Other: No abdominal wall hernia or abnormality. Simple perisplenic ascites. No evidence of loculation or peripheral rim enhancement to suggest abscess. Musculoskeletal: No acute fracture or aggressive appearing lytic or blastic osseous lesion. Review of the MIP images confirms the above findings. IMPRESSION: 1. Mild multifocal predominantly calcified atherosclerotic plaque. No irregular fibrofatty plaque, ulcerations or wall adherent mural thrombus to suggest a potential embolic source. No evidence of thrombus within the left atrial appendage. No evidence of additional embolic phenomenon (no splenic or renal infarcts). 2. Interval resolution of posterior gastric wall pneumatosis. There is some residual perisplenic ascites and a likely reactive small to moderate left-sided pleural effusion which may represent sequelae from the recent gastric process. 3. Stable thoracic aortic aneurysm with a maximal diameter of 4.4 cm. Recommend annual imaging followup by CTA or MRA. This recommendation follows 2010 ACCF/AHA/AATS/ACR/ASA/SCA/SCAI/SIR/STS/SVM Guidelines for the Diagnosis and Management of Patients with Thoracic Aortic Disease. Circulation. 2010; 121: F027-X412. Aortic aneurysm NOS (ICD10-I71.9); Aortic Atherosclerosis (ICD10-170.0) 4. Coronary artery calcifications. Electronically Signed   By: Jacqulynn Cadet M.D.   On: 11/25/2017 14:42   Ct Angio Chest Aorta W/cm &/or Wo/cm  Result Date: 11/25/2017 CLINICAL DATA:  77 year old male with gastric pneumatosis. Evaluate for possible source for arterial embolus. EXAM: CT ANGIOGRAPHY  CHEST, ABDOMEN AND PELVIS TECHNIQUE: Multidetector CT imaging through the chest, abdomen and pelvis was performed using the standard protocol during bolus administration of intravenous contrast. Multiplanar reconstructed images and MIPs were obtained and reviewed to evaluate the vascular anatomy. CONTRAST:  154mL ISOVUE-370 IOPAMIDOL (ISOVUE-370) INJECTION 76% COMPARISON:  CT scan of the chest, abdomen and pelvis without intravenous contrast 11/21/2017 FINDINGS: CTA CHEST FINDINGS Cardiovascular: Conventional 3 vessel arch anatomy. Trace atherosclerotic calcifications present. No irregular or ulcerated atherosclerotic plaque. Fusiform aneurysmal dilatation of the tubular portion of the ascending thoracic aorta with a maximal diameter of 4.4 cm. No evidence of dissection. The aortic root is normal in caliber. No effacement of  the sino-tubular junction. Calcifications are present throughout the coronary arteries. The heart is normal in size. No evidence of thrombus within the left atrial appendage. No pericardial thickening or effusion. Mediastinum/Nodes: Unremarkable CT appearance of the thyroid gland. No suspicious mediastinal or hilar adenopathy. No soft tissue mediastinal mass. The thoracic esophagus is unremarkable. Lungs/Pleura: Trace right pleural effusion. Small to moderate left layering pleural effusion. Associated left lower lobe atelectasis. No evidence of pulmonary edema. Musculoskeletal: No acute fracture or aggressive appearing lytic or blastic osseous lesion. Review of the MIP images confirms the above findings. CTA ABDOMEN AND PELVIS FINDINGS VASCULAR Aorta: Mild heterogeneous atherosclerotic plaque throughout the abdominal aorta. No evidence of aneurysm or dissection. Celiac: Patent without evidence of aneurysm, dissection, vasculitis or significant stenosis. SMA: Patent without evidence of aneurysm, dissection, vasculitis or significant stenosis. Renals: Both renal arteries are patent without  evidence of aneurysm, dissection, vasculitis, fibromuscular dysplasia or significant stenosis. IMA: Patent without evidence of aneurysm, dissection, vasculitis or significant stenosis. Inflow: Patent without evidence of aneurysm, dissection, vasculitis or significant stenosis. Veins: No obvious venous abnormality within the limitations of this arterial phase study. Review of the MIP images confirms the above findings. NON-VASCULAR Hepatobiliary: Interval resolution of portal venous gas. Stable small circumscribed low-attenuation lesion in the hepatic dome consistent with a benign cyst. Relative hypoplasia of hepatic segment 4B, similar compared to prior imaging. High attenuation material present within the gallbladder lumen likely represents vicarious excretion. No intra or extrahepatic biliary ductal dilatation. Pancreas: Unremarkable. No pancreatic ductal dilatation or surrounding inflammatory changes. Spleen: Normal in size without focal abnormality. Adrenals/Urinary Tract: Adrenal glands are unremarkable. Kidneys are normal, without renal calculi, focal lesion, or hydronephrosis. Stomach/Bowel: Interval resolution of pneumatosis from the gastric wall. There is a suggestion of diffuse mild submucosal edema throughout the stomach but no focal irregularity or ulceration. The proximal duodenum is unremarkable. No evidence of bowel obstruction. The visualized portions of the colon are unremarkable. Lymphatic: No suspicious lymphadenopathy. Other: No abdominal wall hernia or abnormality. Simple perisplenic ascites. No evidence of loculation or peripheral rim enhancement to suggest abscess. Musculoskeletal: No acute fracture or aggressive appearing lytic or blastic osseous lesion. Review of the MIP images confirms the above findings. IMPRESSION: 1. Mild multifocal predominantly calcified atherosclerotic plaque. No irregular fibrofatty plaque, ulcerations or wall adherent mural thrombus to suggest a potential embolic  source. No evidence of thrombus within the left atrial appendage. No evidence of additional embolic phenomenon (no splenic or renal infarcts). 2. Interval resolution of posterior gastric wall pneumatosis. There is some residual perisplenic ascites and a likely reactive small to moderate left-sided pleural effusion which may represent sequelae from the recent gastric process. 3. Stable thoracic aortic aneurysm with a maximal diameter of 4.4 cm. Recommend annual imaging followup by CTA or MRA. This recommendation follows 2010 ACCF/AHA/AATS/ACR/ASA/SCA/SCAI/SIR/STS/SVM Guidelines for the Diagnosis and Management of Patients with Thoracic Aortic Disease. Circulation. 2010; 121: T700-F749. Aortic aneurysm NOS (ICD10-I71.9); Aortic Atherosclerosis (ICD10-170.0) 4. Coronary artery calcifications. Electronically Signed   By: Jacqulynn Cadet M.D.   On: 11/25/2017 14:42    EKG:   Orders placed or performed during the hospital encounter of 11/21/17  . ED EKG  . ED EKG  . EKG 12-Lead  . EKG 12-Lead    ASSESSMENT AND PLAN:   Patient 77 year old with history of coronary artery disease possible congestive heart failure presenting with abdominal pain  # acute  Abdominal pain due to posterior gastric wall infarct with gastric pneumatosis CTA of the abdomen shows resolution of  the pneumatosis Further therapy per surgery, plan is for conservative management for now Further recommendations per surgery   #.Acute kidney injury on chronic kidney disease stage III Hydrate with IV fluids, avoid nephrotoxins Stable  #  Essential hypertension Resume home medication  #.  Coronary artery disease with no chest pain Cardiac enzymes negative EKG unrevealing Hold aspirin other cardiac meds for now until able to take oral p.o.  #.  Alzheimer's dementia-chronic  #  BPH resume Proscar       All the records are reviewed and case discussed with Care Management/Social Workerr. Management plans discussed  with the patient, family and they are in agreement.  CODE STATUS:   TOTAL TIME TAKING CARE OF THIS PATIENT: 32  minutes.   POSSIBLE D/C IN 2-3 DAYS, DEPENDING ON CLINICAL CONDITION.  Note: This dictation was prepared with Dragon dictation along with smaller phrase technology. Any transcriptional errors that result from this process are unintentional.   Dustin Flock M.D on 11/26/2017 at 12:11 PM  Between 7am to 6pm - Pager - [phone] After 6pm go to [email or web address] - password [email or web address] [facility]  Office  [phone]  CC: Primary care physician; Patient, No Pcp Per

## 2017-11-26 NOTE — Progress Notes (Signed)
[facility] Day(s): 5.   Post op day(s):  Marland Kitchen   Interval History: Patient seen and examined, no acute events or new complaints overnight. Patient reports and her wife report no change on her status. No abdominal pain. No nausea or vomiting of his clear liquid diet.   Vital signs in last 24 hours: [min-max] current  Temp:  [98.3 F (36.8 C)-98.7 F (37.1 C)] 98.5 F (36.9 C) (08/31 0609) Pulse Rate:  [85-98] 97 (08/31 0609) Resp:  [17-20] 18 (08/31 0609) BP: (131-155)/(85-92) 155/89 (08/31 0609) SpO2:  [95 %-98 %] 95 % (08/31 0609)     Height: 5\' 11"  (180.3 cm) Weight: 95.1 kg BMI (Calculated): 29.25    Physical Exam:  Constitutional: alert, cooperative and no distress  Respiratory: breathing non-labored at rest  Cardiovascular: regular rate and sinus rhythm  Gastrointestinal: soft, non-tender, and non-distended  Labs:  CBC Latest Ref Rng & Units 11/26/2017 11/25/2017 11/24/2017  WBC 3.8 - 10.6 K/uL 7.7 8.6 11.6(H)  Hemoglobin 13.0 - 18.0 g/dL 11.1(L) 11.5(L) 12.7(L)  Hematocrit 40.0 - 52.0 % 31.8(L) 33.6(L) 37.6(L)  Platelets 150 - 440 K/uL 101(L) 94(L) 95(L)   CMP Latest Ref Rng & Units 11/25/2017 11/24/2017 11/23/2017  Glucose 70 - 99 mg/dL 118(H) 114(H) 143(H)  BUN 8 - 23 mg/dL 14 21 25(H)  Creatinine 0.61 - 1.24 mg/dL 1.54(H) 1.59(H) 1.63(H)  Sodium 135 - 145 mmol/L 144 144 142  Potassium 3.5 - 5.1 mmol/L 3.3(L) 3.6 4.0  Chloride 98 - 111 mmol/L 111 111 108  CO2 22 - 32 mmol/L 29 27 25   Calcium 8.9 - 10.3 mg/dL 7.9(L) 8.0(L) 8.1(L)  Total Protein 6.5 - 8.1 g/dL - - -  Total Bilirubin 0.3 - 1.2 mg/dL - - -  Alkaline Phos 38 - 126 U/L - - -  AST 15 - 41 U/L - - -  ALT 17 - 63 U/L - - -    Imaging studies:  EXAM: CT ANGIOGRAPHY CHEST, ABDOMEN AND PELVIS  TECHNIQUE: Multidetector CT imaging through the chest, abdomen and pelvis was performed using the standard protocol during bolus administration of intravenous contrast. Multiplanar  reconstructed images and MIPs were obtained and reviewed to evaluate the vascular anatomy.  CONTRAST:  170mL ISOVUE-370 IOPAMIDOL (ISOVUE-370) INJECTION 76%  COMPARISON:  CT scan of the chest, abdomen and pelvis without intravenous contrast 11/21/2017  FINDINGS: CTA CHEST FINDINGS  Cardiovascular: Conventional 3 vessel arch anatomy. Trace atherosclerotic calcifications present. No irregular or ulcerated atherosclerotic plaque. Fusiform aneurysmal dilatation of the tubular portion of the ascending thoracic aorta with a maximal diameter of 4.4 cm. No evidence of dissection. The aortic root is normal in caliber. No effacement of the sino-tubular junction. Calcifications are present throughout the coronary arteries. The heart is normal in size. No evidence of thrombus within the left atrial appendage. No pericardial thickening or effusion.  Mediastinum/Nodes: Unremarkable CT appearance of the thyroid gland. No suspicious mediastinal or hilar adenopathy. No soft tissue mediastinal mass. The thoracic esophagus is unremarkable.  Lungs/Pleura: Trace right pleural effusion. Small to moderate left layering pleural effusion. Associated left lower lobe atelectasis. No evidence of pulmonary edema.  Musculoskeletal: No acute fracture or aggressive appearing lytic or blastic osseous lesion.  Review of the MIP images confirms the above findings.  CTA ABDOMEN AND PELVIS FINDINGS  VASCULAR  Aorta: Mild heterogeneous atherosclerotic plaque throughout the abdominal aorta. No evidence of aneurysm or dissection.  Celiac: Patent without evidence of aneurysm, dissection, vasculitis or significant stenosis.  SMA:  Patent without evidence of aneurysm, dissection, vasculitis or significant stenosis.  Renals: Both renal arteries are patent without evidence of aneurysm, dissection, vasculitis, fibromuscular dysplasia or significant stenosis.  IMA: Patent without evidence of  aneurysm, dissection, vasculitis or significant stenosis.  Inflow: Patent without evidence of aneurysm, dissection, vasculitis or significant stenosis.  Veins: No obvious venous abnormality within the limitations of this arterial phase study.  Review of the MIP images confirms the above findings.  NON-VASCULAR  Hepatobiliary: Interval resolution of portal venous gas. Stable small circumscribed low-attenuation lesion in the hepatic dome consistent with a benign cyst. Relative hypoplasia of hepatic segment 4B, similar compared to prior imaging. High attenuation material present within the gallbladder lumen likely represents vicarious excretion. No intra or extrahepatic biliary ductal dilatation.  Pancreas: Unremarkable. No pancreatic ductal dilatation or surrounding inflammatory changes.  Spleen: Normal in size without focal abnormality.  Adrenals/Urinary Tract: Adrenal glands are unremarkable. Kidneys are normal, without renal calculi, focal lesion, or hydronephrosis.  Stomach/Bowel: Interval resolution of pneumatosis from the gastric wall. There is a suggestion of diffuse mild submucosal edema throughout the stomach but no focal irregularity or ulceration. The proximal duodenum is unremarkable. No evidence of bowel obstruction. The visualized portions of the colon are unremarkable.  Lymphatic: No suspicious lymphadenopathy.  Other: No abdominal wall hernia or abnormality. Simple perisplenic ascites. No evidence of loculation or peripheral rim enhancement to suggest abscess.  Musculoskeletal: No acute fracture or aggressive appearing lytic or blastic osseous lesion.  Review of the MIP images confirms the above findings.  IMPRESSION: 1. Mild multifocal predominantly calcified atherosclerotic plaque. No irregular fibrofatty plaque, ulcerations or wall adherent mural thrombus to suggest a potential embolic source. No evidence of thrombus within the left  atrial appendage. No evidence of additional embolic phenomenon (no splenic or renal infarcts). 2. Interval resolution of posterior gastric wall pneumatosis. There is some residual perisplenic ascites and a likely reactive small to moderate left-sided pleural effusion which may represent sequelae from the recent gastric process. 3. Stable thoracic aortic aneurysm with a maximal diameter of 4.4 cm. Recommend annual imaging followup by CTA or MRA. This recommendation follows 2010 ACCF/AHA/AATS/ACR/ASA/SCA/SCAI/SIR/STS/SVM Guidelines for the Diagnosis and Management of Patients with Thoracic Aortic Disease. Circulation. 2010; 121: B559-R416. Aortic aneurysm NOS (ICD10-I71.9); Aortic Atherosclerosis (ICD10-170.0) 4. Coronary artery calcifications.   Electronically Signed   By: Jacqulynn Cadet M.D.   On: 11/25/2017 14:42  Assessment/Plan:  77 y.o.malewith gastric infarct of unclear etiology and improving leukocytosis, complicated by comorbidities includingadvanced chronological age, HTN, CAD, alzheimer's dementia, and BPHwith overall guarded prognosis. - Patient tolerated clear liquid diet - CT scan was discussed with wife and family member in room that shows improvement of gastric wall. - Will progress diet to full liquid and assess for toleration  I have personally reviewed the patient's chart, evaluated/examined the patient, proposed the recommended management, and discussed these recommendations with the patient and his family to their expressed satisfaction as well as with patient's RN.  Arnold Long, MD

## 2017-11-27 LAB — BASIC METABOLIC PANEL
Anion gap: 7 (ref 5–15)
BUN: 10 mg/dL (ref 8–23)
CO2: 25 mmol/L (ref 22–32)
Calcium: 7.9 mg/dL — ABNORMAL LOW (ref 8.9–10.3)
Chloride: 107 mmol/L (ref 98–111)
Creatinine, Ser: 1.2 mg/dL (ref 0.61–1.24)
GFR calc Af Amer: >60 mL/min (ref >60)
GFR calc non Af Amer: 57 mL/min — ABNORMAL LOW (ref >60)
Glucose, Bld: 102 mg/dL — ABNORMAL HIGH (ref 70–99)
Potassium: 3.6 mmol/L (ref 3.5–5.1)
Sodium: 139 mmol/L (ref 135–145)

## 2017-11-27 MED ORDER — RIVASTIGMINE TARTRATE 3 MG PO CAPS
6.0000 mg | ORAL_CAPSULE | Freq: Two times a day (BID) | ORAL | Status: DC
  Administered 2017-11-27 – 2017-11-28 (×2): 6 mg via ORAL
  Filled 2017-11-27 (×3): qty 2

## 2017-11-27 NOTE — Progress Notes (Signed)
Pt and family refused to have depends changed

## 2017-11-27 NOTE — Progress Notes (Signed)
Patient refused to ambulate in halls upon each request. Patient's daughter also tried to get him to ambulate and patient refused. Patient stated " you are trying to expose me". Patient's daughter stated " I think because he has the gown on it feels open to him, I'm going to have my mom bring him some pants or something that will cover him up more so he feels secure and then maybe he will walk". Will continue to try and ambulate patient. Patient was up to chair for the majority of the shift.

## 2017-11-27 NOTE — Progress Notes (Signed)
[facility] Day(s): 6.   Post op day(s):  Marland Kitchen   Interval History: Patient seen and examined today sitting on the chair. Patient pulled condom foley yesterday. No significant problem with that. Today calm and ready to eat breakfast. Wife denies nausea or vomiting. Wife denies patient has complained of abdominal pain.   Vital signs in last 24 hours: [min-max] current  Temp:  [98.7 F (37.1 C)-99.4 F (37.4 C)] 98.9 F (37.2 C) (09/01 0637) Pulse Rate:  [71-78] 71 (09/01 0915) Resp:  [17-18] 18 (09/01 0637) BP: (119-168)/(76-88) 124/84 (09/01 0915) SpO2:  [96 %-98 %] 96 % (09/01 0637)     Height: 5\' 11"  (180.3 cm) Weight: 95.1 kg BMI (Calculated): 29.25   Physical Exam:  Constitutional: alert, cooperative and no distress  Respiratory: breathing non-labored at rest  Cardiovascular: regular rate and sinus rhythm  Gastrointestinal: soft, non-tender, and non-distended  Labs:  CBC Latest Ref Rng & Units 11/26/2017 11/25/2017 11/24/2017  WBC 3.8 - 10.6 K/uL 7.7 8.6 11.6(H)  Hemoglobin 13.0 - 18.0 g/dL 11.1(L) 11.5(L) 12.7(L)  Hematocrit 40.0 - 52.0 % 31.8(L) 33.6(L) 37.6(L)  Platelets 150 - 440 K/uL 101(L) 94(L) 95(L)   CMP Latest Ref Rng & Units 11/27/2017 11/26/2017 11/25/2017  Glucose 70 - 99 mg/dL 102(H) 124(H) 118(H)  BUN 8 - 23 mg/dL 10 11 14   Creatinine 0.61 - 1.24 mg/dL 1.20 1.43(H) 1.54(H)  Sodium 135 - 145 mmol/L 139 142 144  Potassium 3.5 - 5.1 mmol/L 3.6 3.5 3.3(L)  Chloride 98 - 111 mmol/L 107 108 111  CO2 22 - 32 mmol/L 25 27 29   Calcium 8.9 - 10.3 mg/dL 7.9(L) 8.0(L) 7.9(L)  Total Protein 6.5 - 8.1 g/dL - - -  Total Bilirubin 0.3 - 1.2 mg/dL - - -  Alkaline Phos 38 - 126 U/L - - -  AST 15 - 41 U/L - - -  ALT 17 - 63 U/L - - -    Imaging studies: No new pertinent imaging studies   Assessment/Plan:  77 y.o.malewith gastric infarct of unclear etiology and improving leukocytosis, complicated by comorbidities includingadvanced chronological age,  HTN, CAD, alzheimer's dementia, and BPHwith overall guarded prognosis. - Patient tolerated full liquid diet yesterday. Will progress to soft diet.  - Encourage wife to get help to walk patient as tolerated.  - If patient tolerates diet will consider discharge tomorrow.   I have personally reviewed the patient's chart, evaluated/examined the patient, proposed the recommended management, and discussed these recommendations with the patient and his family to their expressed satisfaction as well as with patient's RN.   Arnold Long, MD

## 2017-11-27 NOTE — Progress Notes (Signed)
Pt agitated pulled codom cath pulled off, pt attempting to get out of bed.

## 2017-11-27 NOTE — Progress Notes (Signed)
[facility] [place] NAME: Alex [name]    MR#:  [id number]  DATE OF BIRTH:  16-Aug-1940  SUBJECTIVE:  CHIEF COMPLAINT: Doing well has no complaints REVIEW OF SYSTEMS:  Review of system unobtainable as the patient has chronic baseline dementia  DRUG ALLERGIES:   Allergies  Allergen Reactions  . Penicillins Other (See Comments)    Unknown reaction, childhood  Has patient had a PCN reaction causing immediate rash, facial/tongue/throat swelling, SOB or lightheadedness with hypotension: Yes Has patient had a PCN reaction causing severe rash involving mucus membranes or skin necrosis: No Has patient had a PCN reaction that required hospitalization: No Has patient had a PCN reaction occurring within the last 10 years: No If all of the above answers are "NO", then may proceed with Cephalosporin use.     VITALS:  Blood pressure 127/79, pulse 76, temperature 98.7 F (37.1 C), temperature source Oral, resp. rate 19, height 5\' 11"  (1.803 m), weight 95.1 kg, SpO2 96 %.  PHYSICAL EXAMINATION:  GENERAL:  77 y.o.-year-old patient lying in the bed with no acute distress.  EYES: Pupils equal, round, reactive to light and accommodation. No scleral icterus. Extraocular muscles intact.  HEENT: Head atraumatic, normocephalic. Oropharynx and nasopharynx clear.  NECK:  Supple, no jugular venous distention. No thyroid enlargement, no tenderness.  LUNGS: Normal breath sounds bilaterally, no wheezing, rales,rhonchi or crepitation. No use of accessory muscles of respiration.  CARDIOVASCULAR: S1, S2 normal. No murmurs, rubs, or gallops.  ABDOMEN: Soft, nontender, bowel sounds present.  EXTREMITIES: No pedal edema, cyanosis, or clubbing.  NEUROLOGIC: Arousable but disoriented gait not checked.  PSYCHIATRIC: The patient is arousable but disoriented SKIN: No obvious rash, lesion, or ulcer.    LABORATORY PANEL:   CBC Recent Labs  Lab 11/26/17 0403   WBC 7.7  HGB 11.1*  HCT 31.8*  PLT 101*   ------------------------------------------------------------------------------------------------------------------  Chemistries  Recent Labs  Lab 11/27/17 0600  NA 139  K 3.6  CL 107  CO2 25  GLUCOSE 102*  BUN 10  CREATININE 1.20  CALCIUM 7.9*   ------------------------------------------------------------------------------------------------------------------  Cardiac Enzymes Recent Labs  Lab 11/21/17 0631  TROPONINI <0.03   ------------------------------------------------------------------------------------------------------------------  RADIOLOGY:  Ct Angio Abdomen W &/or Wo Contrast  Result Date: 11/25/2017 CLINICAL DATA:  77 year old male with gastric pneumatosis. Evaluate for possible source for arterial embolus. EXAM: CT ANGIOGRAPHY CHEST, ABDOMEN AND PELVIS TECHNIQUE: Multidetector CT imaging through the chest, abdomen and pelvis was performed using the standard protocol during bolus administration of intravenous contrast. Multiplanar reconstructed images and MIPs were obtained and reviewed to evaluate the vascular anatomy. CONTRAST:  165mL ISOVUE-370 IOPAMIDOL (ISOVUE-370) INJECTION 76% COMPARISON:  CT scan of the chest, abdomen and pelvis without intravenous contrast 11/21/2017 FINDINGS: CTA CHEST FINDINGS Cardiovascular: Conventional 3 vessel arch anatomy. Trace atherosclerotic calcifications present. No irregular or ulcerated atherosclerotic plaque. Fusiform aneurysmal dilatation of the tubular portion of the ascending thoracic aorta with a maximal diameter of 4.4 cm. No evidence of dissection. The aortic root is normal in caliber. No effacement of the sino-tubular junction. Calcifications are present throughout the coronary arteries. The heart is normal in size. No evidence of thrombus within the left atrial appendage. No pericardial thickening or effusion. Mediastinum/Nodes: Unremarkable CT appearance of the thyroid gland. No  suspicious mediastinal or hilar adenopathy. No soft tissue mediastinal mass. The thoracic esophagus is unremarkable. Lungs/Pleura: Trace right pleural effusion. Small to moderate left layering pleural effusion. Associated left lower lobe atelectasis. No evidence  of pulmonary edema. Musculoskeletal: No acute fracture or aggressive appearing lytic or blastic osseous lesion. Review of the MIP images confirms the above findings. CTA ABDOMEN AND PELVIS FINDINGS VASCULAR Aorta: Mild heterogeneous atherosclerotic plaque throughout the abdominal aorta. No evidence of aneurysm or dissection. Celiac: Patent without evidence of aneurysm, dissection, vasculitis or significant stenosis. SMA: Patent without evidence of aneurysm, dissection, vasculitis or significant stenosis. Renals: Both renal arteries are patent without evidence of aneurysm, dissection, vasculitis, fibromuscular dysplasia or significant stenosis. IMA: Patent without evidence of aneurysm, dissection, vasculitis or significant stenosis. Inflow: Patent without evidence of aneurysm, dissection, vasculitis or significant stenosis. Veins: No obvious venous abnormality within the limitations of this arterial phase study. Review of the MIP images confirms the above findings. NON-VASCULAR Hepatobiliary: Interval resolution of portal venous gas. Stable small circumscribed low-attenuation lesion in the hepatic dome consistent with a benign cyst. Relative hypoplasia of hepatic segment 4B, similar compared to prior imaging. High attenuation material present within the gallbladder lumen likely represents vicarious excretion. No intra or extrahepatic biliary ductal dilatation. Pancreas: Unremarkable. No pancreatic ductal dilatation or surrounding inflammatory changes. Spleen: Normal in size without focal abnormality. Adrenals/Urinary Tract: Adrenal glands are unremarkable. Kidneys are normal, without renal calculi, focal lesion, or hydronephrosis. Stomach/Bowel: Interval  resolution of pneumatosis from the gastric wall. There is a suggestion of diffuse mild submucosal edema throughout the stomach but no focal irregularity or ulceration. The proximal duodenum is unremarkable. No evidence of bowel obstruction. The visualized portions of the colon are unremarkable. Lymphatic: No suspicious lymphadenopathy. Other: No abdominal wall hernia or abnormality. Simple perisplenic ascites. No evidence of loculation or peripheral rim enhancement to suggest abscess. Musculoskeletal: No acute fracture or aggressive appearing lytic or blastic osseous lesion. Review of the MIP images confirms the above findings. IMPRESSION: 1. Mild multifocal predominantly calcified atherosclerotic plaque. No irregular fibrofatty plaque, ulcerations or wall adherent mural thrombus to suggest a potential embolic source. No evidence of thrombus within the left atrial appendage. No evidence of additional embolic phenomenon (no splenic or renal infarcts). 2. Interval resolution of posterior gastric wall pneumatosis. There is some residual perisplenic ascites and a likely reactive small to moderate left-sided pleural effusion which may represent sequelae from the recent gastric process. 3. Stable thoracic aortic aneurysm with a maximal diameter of 4.4 cm. Recommend annual imaging followup by CTA or MRA. This recommendation follows 2010 ACCF/AHA/AATS/ACR/ASA/SCA/SCAI/SIR/STS/SVM Guidelines for the Diagnosis and Management of Patients with Thoracic Aortic Disease. Circulation. 2010; 121: H607-P710. Aortic aneurysm NOS (ICD10-I71.9); Aortic Atherosclerosis (ICD10-170.0) 4. Coronary artery calcifications. Electronically Signed   By: Jacqulynn Cadet M.D.   On: 11/25/2017 14:42   Ct Angio Chest Aorta W/cm &/or Wo/cm  Result Date: 11/25/2017 CLINICAL DATA:  77 year old male with gastric pneumatosis. Evaluate for possible source for arterial embolus. EXAM: CT ANGIOGRAPHY CHEST, ABDOMEN AND PELVIS TECHNIQUE: Multidetector CT  imaging through the chest, abdomen and pelvis was performed using the standard protocol during bolus administration of intravenous contrast. Multiplanar reconstructed images and MIPs were obtained and reviewed to evaluate the vascular anatomy. CONTRAST:  137mL ISOVUE-370 IOPAMIDOL (ISOVUE-370) INJECTION 76% COMPARISON:  CT scan of the chest, abdomen and pelvis without intravenous contrast 11/21/2017 FINDINGS: CTA CHEST FINDINGS Cardiovascular: Conventional 3 vessel arch anatomy. Trace atherosclerotic calcifications present. No irregular or ulcerated atherosclerotic plaque. Fusiform aneurysmal dilatation of the tubular portion of the ascending thoracic aorta with a maximal diameter of 4.4 cm. No evidence of dissection. The aortic root is normal in caliber. No effacement of the sino-tubular junction. Calcifications are  present throughout the coronary arteries. The heart is normal in size. No evidence of thrombus within the left atrial appendage. No pericardial thickening or effusion. Mediastinum/Nodes: Unremarkable CT appearance of the thyroid gland. No suspicious mediastinal or hilar adenopathy. No soft tissue mediastinal mass. The thoracic esophagus is unremarkable. Lungs/Pleura: Trace right pleural effusion. Small to moderate left layering pleural effusion. Associated left lower lobe atelectasis. No evidence of pulmonary edema. Musculoskeletal: No acute fracture or aggressive appearing lytic or blastic osseous lesion. Review of the MIP images confirms the above findings. CTA ABDOMEN AND PELVIS FINDINGS VASCULAR Aorta: Mild heterogeneous atherosclerotic plaque throughout the abdominal aorta. No evidence of aneurysm or dissection. Celiac: Patent without evidence of aneurysm, dissection, vasculitis or significant stenosis. SMA: Patent without evidence of aneurysm, dissection, vasculitis or significant stenosis. Renals: Both renal arteries are patent without evidence of aneurysm, dissection, vasculitis, fibromuscular  dysplasia or significant stenosis. IMA: Patent without evidence of aneurysm, dissection, vasculitis or significant stenosis. Inflow: Patent without evidence of aneurysm, dissection, vasculitis or significant stenosis. Veins: No obvious venous abnormality within the limitations of this arterial phase study. Review of the MIP images confirms the above findings. NON-VASCULAR Hepatobiliary: Interval resolution of portal venous gas. Stable small circumscribed low-attenuation lesion in the hepatic dome consistent with a benign cyst. Relative hypoplasia of hepatic segment 4B, similar compared to prior imaging. High attenuation material present within the gallbladder lumen likely represents vicarious excretion. No intra or extrahepatic biliary ductal dilatation. Pancreas: Unremarkable. No pancreatic ductal dilatation or surrounding inflammatory changes. Spleen: Normal in size without focal abnormality. Adrenals/Urinary Tract: Adrenal glands are unremarkable. Kidneys are normal, without renal calculi, focal lesion, or hydronephrosis. Stomach/Bowel: Interval resolution of pneumatosis from the gastric wall. There is a suggestion of diffuse mild submucosal edema throughout the stomach but no focal irregularity or ulceration. The proximal duodenum is unremarkable. No evidence of bowel obstruction. The visualized portions of the colon are unremarkable. Lymphatic: No suspicious lymphadenopathy. Other: No abdominal wall hernia or abnormality. Simple perisplenic ascites. No evidence of loculation or peripheral rim enhancement to suggest abscess. Musculoskeletal: No acute fracture or aggressive appearing lytic or blastic osseous lesion. Review of the MIP images confirms the above findings. IMPRESSION: 1. Mild multifocal predominantly calcified atherosclerotic plaque. No irregular fibrofatty plaque, ulcerations or wall adherent mural thrombus to suggest a potential embolic source. No evidence of thrombus within the left atrial  appendage. No evidence of additional embolic phenomenon (no splenic or renal infarcts). 2. Interval resolution of posterior gastric wall pneumatosis. There is some residual perisplenic ascites and a likely reactive small to moderate left-sided pleural effusion which may represent sequelae from the recent gastric process. 3. Stable thoracic aortic aneurysm with a maximal diameter of 4.4 cm. Recommend annual imaging followup by CTA or MRA. This recommendation follows 2010 ACCF/AHA/AATS/ACR/ASA/SCA/SCAI/SIR/STS/SVM Guidelines for the Diagnosis and Management of Patients with Thoracic Aortic Disease. Circulation. 2010; 121: L491-P915. Aortic aneurysm NOS (ICD10-I71.9); Aortic Atherosclerosis (ICD10-170.0) 4. Coronary artery calcifications. Electronically Signed   By: Jacqulynn Cadet M.D.   On: 11/25/2017 14:42    EKG:   Orders placed or performed during the hospital encounter of 11/21/17  . ED EKG  . ED EKG  . EKG 12-Lead  . EKG 12-Lead    ASSESSMENT AND PLAN:   Patient 77 year old with history of coronary artery disease possible congestive heart failure presenting with abdominal pain  # acute  Abdominal pain due to posterior gastric wall infarct with gastric pneumatosis CTA of the abdomen shows resolution of the pneumatosis Further therapy per  surgery, plan is for conservative management for now Further recommendations per surgery   #.Acute kidney injury on chronic kidney disease stage III Hydrate with IV fluids, avoid nephrotoxins Stable  #  Essential hypertension Patient may be able to resume his home meds on discharge  #.  Coronary artery disease with no chest pain Cardiac enzymes negative EKG unrevealing Hold aspirin other cardiac meds for now until able to take oral p.o.  #.  Alzheimer's dementia-chronic  #  BPH resume Proscar    These call with any questions medicine will sign off on this patient   All the records are reviewed and case discussed with Care  Management/Social Workerr. Management plans discussed with the patient, family and they are in agreement.  CODE STATUS:   TOTAL TIME TAKING CARE OF THIS PATIENT: 32  minutes.   POSSIBLE D/C IN 2-3 DAYS, DEPENDING ON CLINICAL CONDITION.  Note: This dictation was prepared with Dragon dictation along with smaller phrase technology. Any transcriptional errors that result from this process are unintentional.   Dustin Flock M.D on 11/27/2017 at 12:56 PM  Between 7am to 6pm - Pager - [phone] After 6pm go to [email or web address] - password [email or web address] [facility]  Office  [phone]  CC: Primary care physician; Patient, No Pcp Per

## 2017-11-28 NOTE — Discharge Summary (Addendum)
Patient seen and examined as described below with surgical PA-C, Ardell Isaacs. I have personally reviewed the patient's chart, evaluated/examined the patient, proposed the recommended management, and discussed these recommendations with the patient to his expressed satisfaction.  -- Marilynne Drivers Rosana Hoes, MD, [place]: [facility] - Partnering for exceptional care. Office: [phone]  Discharge Summary  Patient ID: Alex [name] MRN: [id number] DOB/AGE: [birth year] 77 y.o.  Admit date: 11/21/2017 Discharge date: 11/28/2017  Discharge Diagnoses Patient Active Problem List   Diagnosis Date Noted  . Pneumatosis of intestines   . Perforated gastric ulcer ([place]) 11/21/2017  . Chest pain 06/27/2015  . Uncontrolled hypertension 06/27/2015  . Bradycardia 06/27/2015  . Pleural thickening 06/27/2015    Consultants Medicine Nephrology  Procedures None  HPI: Alex [name] is a 77 y.o. male who presented to [facility] ED on 08/26 via EMS for abdominal pain. Pt's wife helped contribute to the history given history of dementia. They report the patient noticed sharp epigastric pain around midnight associated with non-bilious non-bloody emesis. He has continued to have BMs without blood including one while in the ED. No complaints of chest pain, SOB, fever, or chills. His wife does endorse that the patient has been weaker than normal but in terms of ADLs has been able to feed himself, dress, and do some household chores. In the ED, CT revealed venous portal gas and pneumatosis of the posterior gastric wall. He was admitted to general surgery and it was decided to try conservative management at that time with the hospitalist as consult for his chronic medical conditions.    Hospital Course: On HD1, he had an increase in his WBC to 20.4 and his BUN and sCr worsened slightly which was cause for concern. He was switched to Meropenem and his IVF were increase and  nephrology was consulted to follow. He underwent echocardiogram on the same day to evaluate for potential source of thrombus as a cause of gastric pneumatosis however his echocardiogram was unremarkable in that aspect. Over the course of the next few days he remained NPO as his laboratory work began to improve. His abdominal examination was without signs concerning for an acute process or peritonitis throughout his hospital course. On HD4, his renal function improved and a CTA was obtained to again further evaluate for source of thrombus, however again this was unremarkable in that regard and his pneumatosis had resolved. After this, his diet was advanced and he began mobilizing. His lab work continued to improve and his WBC remained in a normal range. On the day of discharge, he was tolerating a diet, mobilizing well, and continued to be without pain. All of his wife's questions were addressed and answered. We recommend follow up with his PCP in about 2 weeks and he can follow up with general surgery as needed.    Physical Examination: Const: NAD Card: HRRR Pulm: CTAB, No wheezes Abdom: Soft, non-tender, non-distended, BS all 4 quadrants MSK: No peripheral edema Neuro: Alert to self only  Allergies as of 11/28/2017      Reactions   Penicillins Other (See Comments)   Unknown reaction, childhood  Has patient had a PCN reaction causing immediate rash, facial/tongue/throat swelling, SOB or lightheadedness with hypotension: Yes Has patient had a PCN reaction causing severe rash involving mucus membranes or skin necrosis: No Has patient had a PCN reaction that required hospitalization: No Has patient had a PCN reaction occurring within the last 10 years: No If all of the above answers  are "NO", then may proceed with Cephalosporin use.      Medication List    STOP taking these medications   doxycycline 50 MG capsule Commonly known as:  VIBRAMYCIN   HYDROcodone-acetaminophen 5-325 MG  tablet Commonly known as:  NORCO/VICODIN     TAKE these medications   amLODipine 5 MG tablet Commonly known as:  NORVASC Take 5 mg by mouth daily.   aspirin EC 81 MG tablet Take 81 mg by mouth daily.   cloNIDine 0.1 MG tablet Commonly known as:  CATAPRES Take 1 tablet (0.1 mg total) by mouth 2 (two) times daily as needed.   donepezil 10 MG tablet Commonly known as:  ARICEPT Take 1 tablet (10 mg total) by mouth at bedtime.   finasteride 5 MG tablet Commonly known as:  PROSCAR Take 1 tablet (5 mg total) by mouth daily.   furosemide 40 MG tablet Commonly known as:  LASIX Take 40 mg by mouth daily.   ibuprofen 800 MG tablet Commonly known as:  ADVIL,MOTRIN Take 1 tablet (800 mg total) by mouth every 8 (eight) hours as needed for moderate pain.   metoprolol succinate 25 MG 24 hr tablet Commonly known as:  TOPROL-XL Take 25 mg by mouth daily. Take with or immediately following a meal.   pravastatin 80 MG tablet Commonly known as:  PRAVACHOL Take 80 mg by mouth daily.   rivastigmine 6 MG capsule Commonly known as:  EXELON Take 6 mg by mouth 2 (two) times daily.      Follow Up:  Patient can resume [place] at this time, which he was receiving prior to admission  Follow-up Information    Vickie Epley, MD. Call.   Specialty:  General Surgery Why:  as needed for follow up  Contact information: [address] [place] [address] [phone]        Dr. Mattie Marlin Smith,MD. Schedule an appointment as soon as possible for a visit in 2 week(s).   Why:  Make a hospital follow up with you PCP in 2 weeks or sooner as needed Contact information: [address]  [place], [place]          Signed: Edison Simon , PA-C [facility]  11/28/2017, 9:41 AM [phone] M-F: 7am - 4pm

## 2017-11-28 NOTE — Care Management Important Message (Signed)
Important Message  Patient Details  Name: Alex [name] MRN: [id number] Date of Birth: 03/01/1941   Medicare Important Message Given:  Yes    [name] 11/28/2017, 9:53 AM

## 2017-11-28 NOTE — Progress Notes (Signed)
Discharge teaching given to patient, patient verbalized understanding and had no questions. Patient IV removed. Patient will be transported home by family. All patient belongings gathered prior to leaving.  

## 2017-11-29 ENCOUNTER — Other Ambulatory Visit: Payer: Self-pay

## 2017-11-29 ENCOUNTER — Observation Stay
Admission: EM | Admit: 2017-11-29 | Discharge: 2017-12-05 | Disposition: A | Discharge status: Home / Self Care | Payer: Medicare HMO | Attending: Internal Medicine | Admitting: Internal Medicine

## 2017-11-29 ENCOUNTER — Encounter: Payer: Self-pay | Admitting: Emergency Medicine

## 2017-11-29 DIAGNOSIS — F0280 Dementia in other diseases classified elsewhere without behavioral disturbance: Secondary | ICD-10-CM | POA: Diagnosis not present

## 2017-11-29 DIAGNOSIS — Z79899 Other long term (current) drug therapy: Secondary | ICD-10-CM | POA: Diagnosis not present

## 2017-11-29 DIAGNOSIS — G9340 Encephalopathy, unspecified: Principal | ICD-10-CM | POA: Insufficient documentation

## 2017-11-29 DIAGNOSIS — I129 Hypertensive chronic kidney disease with stage 1 through stage 4 chronic kidney disease, or unspecified chronic kidney disease: Secondary | ICD-10-CM | POA: Diagnosis not present

## 2017-11-29 DIAGNOSIS — I2510 Atherosclerotic heart disease of native coronary artery without angina pectoris: Secondary | ICD-10-CM | POA: Insufficient documentation

## 2017-11-29 DIAGNOSIS — Z955 Presence of coronary angioplasty implant and graft: Secondary | ICD-10-CM | POA: Diagnosis not present

## 2017-11-29 DIAGNOSIS — N183 Chronic kidney disease, stage 3 (moderate): Secondary | ICD-10-CM | POA: Diagnosis not present

## 2017-11-29 DIAGNOSIS — R270 Ataxia, unspecified: Secondary | ICD-10-CM | POA: Diagnosis not present

## 2017-11-29 DIAGNOSIS — G309 Alzheimer's disease, unspecified: Secondary | ICD-10-CM | POA: Insufficient documentation

## 2017-11-29 DIAGNOSIS — N400 Enlarged prostate without lower urinary tract symptoms: Secondary | ICD-10-CM | POA: Diagnosis not present

## 2017-11-29 DIAGNOSIS — R52 Pain, unspecified: Secondary | ICD-10-CM

## 2017-11-29 DIAGNOSIS — R531 Weakness: Secondary | ICD-10-CM | POA: Diagnosis present

## 2017-11-29 DIAGNOSIS — R262 Difficulty in walking, not elsewhere classified: Secondary | ICD-10-CM | POA: Diagnosis present

## 2017-11-29 DIAGNOSIS — Z7982 Long term (current) use of aspirin: Secondary | ICD-10-CM | POA: Insufficient documentation

## 2017-11-29 LAB — URINALYSIS, COMPLETE (UACMP) WITH MICROSCOPIC
Bacteria, UA: NONE SEEN
Bilirubin Urine: NEGATIVE
Glucose, UA: NEGATIVE mg/dL
Hgb urine dipstick: NEGATIVE
Ketones, ur: NEGATIVE mg/dL
Leukocytes, UA: NEGATIVE
Nitrite: NEGATIVE
Protein, ur: NEGATIVE mg/dL
Specific Gravity, Urine: 1.006 (ref 1.005–1.030)
Squamous Epithelial / LPF: NONE SEEN (ref 0–5)
WBC, UA: NONE SEEN WBC/hpf (ref 0–5)
pH: 7 (ref 5.0–8.0)

## 2017-11-29 LAB — CBC
HCT: 35 % — ABNORMAL LOW (ref 40.0–52.0)
Hemoglobin: 12.2 g/dL — ABNORMAL LOW (ref 13.0–18.0)
MCH: 31.5 pg (ref 26.0–34.0)
MCHC: 34.7 g/dL (ref 32.0–36.0)
MCV: 90.5 fL (ref 80.0–100.0)
Platelets: 164 10*3/uL (ref 150–440)
RBC: 3.87 MIL/uL — ABNORMAL LOW (ref 4.40–5.90)
RDW: 13.7 % (ref 11.5–14.5)
WBC: 8.6 10*3/uL (ref 3.8–10.6)

## 2017-11-29 LAB — BASIC METABOLIC PANEL
Anion gap: 5 (ref 5–15)
BUN: 16 mg/dL (ref 8–23)
CO2: 30 mmol/L (ref 22–32)
Calcium: 8.3 mg/dL — ABNORMAL LOW (ref 8.9–10.3)
Chloride: 104 mmol/L (ref 98–111)
Creatinine, Ser: 1.38 mg/dL — ABNORMAL HIGH (ref 0.61–1.24)
GFR calc Af Amer: 55 mL/min — ABNORMAL LOW (ref >60)
GFR calc non Af Amer: 48 mL/min — ABNORMAL LOW (ref >60)
Glucose, Bld: 107 mg/dL — ABNORMAL HIGH (ref 70–99)
Potassium: 3.4 mmol/L — ABNORMAL LOW (ref 3.5–5.1)
Sodium: 139 mmol/L (ref 135–145)

## 2017-11-29 LAB — GLUCOSE, CAPILLARY: Glucose-Capillary: 89 mg/dL (ref 70–99)

## 2017-11-29 MED ORDER — FINASTERIDE 5 MG PO TABS
5.0000 mg | ORAL_TABLET | Freq: Every day | ORAL | Status: DC
  Administered 2017-11-29 – 2017-12-05 (×5): 5 mg via ORAL
  Filled 2017-11-29 (×6): qty 1

## 2017-11-29 MED ORDER — METOPROLOL SUCCINATE ER 50 MG PO TB24
25.0000 mg | ORAL_TABLET | Freq: Every day | ORAL | Status: DC
  Administered 2017-11-29 – 2017-12-05 (×3): 25 mg via ORAL
  Filled 2017-11-29 (×5): qty 1

## 2017-11-29 MED ORDER — ACETAMINOPHEN 650 MG RE SUPP: 650.0000 mg | Freq: Four times a day (QID) | RECTAL | Status: DC | PRN

## 2017-11-29 MED ORDER — HYDROCODONE-ACETAMINOPHEN 5-325 MG PO TABS: 1.0000 | ORAL_TABLET | ORAL | Status: DC | PRN

## 2017-11-29 MED ORDER — ENOXAPARIN SODIUM 40 MG/0.4ML ~~LOC~~ SOLN
40.0000 mg | SUBCUTANEOUS | Status: DC
  Administered 2017-11-29 – 2017-12-04 (×6): 40 mg via SUBCUTANEOUS
  Filled 2017-11-29 (×6): qty 0.4

## 2017-11-29 MED ORDER — RIVASTIGMINE TARTRATE 3 MG PO CAPS
6.0000 mg | ORAL_CAPSULE | Freq: Two times a day (BID) | ORAL | Status: DC
  Administered 2017-11-29: 6 mg via ORAL
  Filled 2017-11-29 (×3): qty 2

## 2017-11-29 MED ORDER — ONDANSETRON HCL 4 MG/2ML IJ SOLN: 4.0000 mg | Freq: Four times a day (QID) | INTRAMUSCULAR | Status: DC | PRN

## 2017-11-29 MED ORDER — SODIUM CHLORIDE 0.9 % IV SOLN
INTRAVENOUS | Status: DC
  Administered 2017-11-29 – 2017-12-01 (×2): via INTRAVENOUS

## 2017-11-29 MED ORDER — BISACODYL 5 MG PO TBEC
5.0000 mg | DELAYED_RELEASE_TABLET | Freq: Every day | ORAL | Status: DC | PRN
  Filled 2017-11-29: qty 1

## 2017-11-29 MED ORDER — ACETAMINOPHEN 325 MG PO TABS: 650.0000 mg | ORAL_TABLET | Freq: Four times a day (QID) | ORAL | Status: DC | PRN

## 2017-11-29 MED ORDER — HYDRALAZINE HCL 20 MG/ML IJ SOLN
10.0000 mg | Freq: Four times a day (QID) | INTRAMUSCULAR | Status: DC | PRN
  Administered 2017-12-02: 06:00:00 10 mg via INTRAVENOUS
  Filled 2017-11-29: qty 1

## 2017-11-29 MED ORDER — AMLODIPINE BESYLATE 5 MG PO TABS
5.0000 mg | ORAL_TABLET | Freq: Every day | ORAL | Status: DC
  Administered 2017-11-29 – 2017-12-05 (×3): 5 mg via ORAL
  Filled 2017-11-29 (×5): qty 1

## 2017-11-29 MED ORDER — DOCUSATE SODIUM 100 MG PO CAPS
100.0000 mg | ORAL_CAPSULE | Freq: Two times a day (BID) | ORAL | Status: DC
  Administered 2017-11-29 – 2017-12-05 (×9): 100 mg via ORAL
  Filled 2017-11-29 (×11): qty 1

## 2017-11-29 MED ORDER — ONDANSETRON HCL 4 MG PO TABS: 4.0000 mg | ORAL_TABLET | Freq: Four times a day (QID) | ORAL | Status: DC | PRN

## 2017-11-29 MED ORDER — TRAZODONE HCL 50 MG PO TABS
25.0000 mg | ORAL_TABLET | Freq: Every evening | ORAL | Status: DC | PRN
  Administered 2017-12-01 – 2017-12-04 (×4): 25 mg via ORAL
  Filled 2017-11-29 (×5): qty 1

## 2017-11-29 MED ORDER — DONEPEZIL HCL 5 MG PO TABS
10.0000 mg | ORAL_TABLET | Freq: Every day | ORAL | Status: DC
  Administered 2017-11-29: 21:00:00 10 mg via ORAL
  Filled 2017-11-29 (×2): qty 2

## 2017-11-29 MED ORDER — POTASSIUM CHLORIDE CRYS ER 20 MEQ PO TBCR
40.0000 meq | EXTENDED_RELEASE_TABLET | ORAL | Status: AC
  Administered 2017-11-29: 40 meq via ORAL
  Filled 2017-11-29: qty 2

## 2017-11-29 MED ORDER — ASPIRIN EC 81 MG PO TBEC
81.0000 mg | DELAYED_RELEASE_TABLET | Freq: Every day | ORAL | Status: DC
  Administered 2017-11-29 – 2017-12-05 (×5): 81 mg via ORAL
  Filled 2017-11-29 (×6): qty 1

## 2017-11-29 NOTE — ED Provider Notes (Signed)
[facility] Emergency Department Provider Note   ____________________________________________    I have reviewed the triage vital signs and the nursing notes.   HISTORY  Chief Complaint Weakness   History limited by dementia, history provided by family  HPI Alex [name] is a 77 y.o. male who presents with weakness.  Family reports the patient was just discharged from the hospital yesterday but has been too weak to ambulate at home and they are concerned about his safety.  They report he was in the hospital in bed for a week and is now too weak to take care of anything at all and they are unable to care for him appropriately  Past Medical History:  Diagnosis Date  . Alzheimer's dementia   . BPH (benign prostatic hyperplasia)   . CAD (coronary artery disease)   . Dementia   . Hypertension     Patient Active Problem List   Diagnosis Date Noted  . Ambulatory dysfunction 11/29/2017  . Pneumatosis of intestines   . Perforated gastric ulcer ([place]) 11/21/2017  . Chest pain 06/27/2015  . Uncontrolled hypertension 06/27/2015  . Bradycardia 06/27/2015  . Pleural thickening 06/27/2015    Past Surgical History:  Procedure Laterality Date  . CORONARY ANGIOPLASTY WITH STENT PLACEMENT    . HERNIA REPAIR      Prior to Admission medications   Medication Sig Start Date End Date Taking? Authorizing Provider  amLODipine (NORVASC) 5 MG tablet Take 5 mg by mouth daily.   Yes [provider]  aspirin EC 81 MG tablet Take 81 mg by mouth daily.   Yes [provider]  finasteride (PROSCAR) 5 MG tablet Take 1 tablet (5 mg total) by mouth daily. 06/29/15  Yes Wieting, Richard, MD  lisinopril (PRINIVIL,ZESTRIL) 40 MG tablet Take 40 mg by mouth daily.   Yes [provider]  metoprolol succinate (TOPROL-XL) 25 MG 24 hr tablet Take 25 mg by mouth daily. Take with or immediately following a meal.    Yes [provider]  pravastatin  (PRAVACHOL) 80 MG tablet Take 80 mg by mouth daily.   Yes [provider]  rivastigmine (EXELON) 6 MG capsule Take 6 mg by mouth 2 (two) times daily.   Yes [provider]  cloNIDine (CATAPRES) 0.1 MG tablet Take 1 tablet (0.1 mg total) by mouth 2 (two) times daily as needed. Patient not taking: Reported on 11/21/2017 04/28/17   Paulette Blanch, MD  donepezil (ARICEPT) 10 MG tablet Take 1 tablet (10 mg total) by mouth at bedtime. Patient not taking: Reported on 08/15/2016 06/29/15   Loletha Grayer, MD  ibuprofen (ADVIL,MOTRIN) 800 MG tablet Take 1 tablet (800 mg total) by mouth every 8 (eight) hours as needed for moderate pain. Patient not taking: Reported on 11/21/2017 01/30/17   Paulette Blanch, MD     Allergies Penicillins  Family History  Problem Relation Age of Onset  . Hypertension Mother     Social History Social History   Tobacco Use  . Smoking status: Never Smoker  . Smokeless tobacco: Never Used  Substance Use Topics  . Alcohol use: Yes    Comment: rarely  . Drug use: No    Review of Systems limited by dementia  Constitutional: No reports of fever   Respiratory: No reports of cough Gastrointestinal: No vomiting Muscular skeletal: No reports of injuries Skin: Negative for lacerations or abrasions from falls    ____________________________________________   PHYSICAL EXAM:  VITAL SIGNS: ED Triage  Vitals  Enc Vitals Group     BP 11/29/17 0915 (!) 153/106     Pulse Rate 11/29/17 0915 71     Resp 11/29/17 0915 16     Temp 11/29/17 0915 98.7 F (37.1 C)     Temp Source 11/29/17 0915 Oral     SpO2 11/29/17 0915 97 %     Weight 11/29/17 0916 95.1 kg (209 lb 10.5 oz)     Height 11/29/17 0916 1.803 m (5\' 11" )     Head Circumference --      Peak Flow --      Pain Score --      Pain Loc --      Pain Edu? --      Excl. in [place]? --     Constitutional: Alert  Eyes: Conjunctivae are normal.   Nose: No congestion/rhinnorhea. Mouth/Throat: Mucous  membranes are dry  Cardiovascular: Normal rate, regular rhythm. Grossly normal heart sounds.  Good peripheral circulation. Respiratory: Normal respiratory effort.  No retractions. Lungs CTAB. Gastrointestinal: Soft and nontender. No distention.  No CVA tenderness.  Musculoskeletal: No lower extremity tenderness nor edema.   Neurologic:  Normal speech and language. No gross focal neurologic deficits are appreciated.  Skin:  Skin is warm, dry and intact. No rash noted. Psychiatric: Mood and affect are normal. Speech and behavior are normal.  ____________________________________________   LABS (all labs ordered are listed, but only abnormal results are displayed)  Labs Reviewed  BASIC METABOLIC PANEL - Abnormal; Notable for the following components:      Result Value   Potassium 3.4 (*)    Glucose, Bld 107 (*)    Creatinine, Ser 1.38 (*)    Calcium 8.3 (*)    GFR calc non Af Amer 48 (*)    GFR calc Af Amer 55 (*)    All other components within normal limits  CBC - Abnormal; Notable for the following components:   RBC 3.87 (*)    Hemoglobin 12.2 (*)    HCT 35.0 (*)    All other components within normal limits  URINALYSIS, COMPLETE (UACMP) WITH MICROSCOPIC - Abnormal; Notable for the following components:   Color, Urine STRAW (*)    APPearance CLEAR (*)    All other components within normal limits  GLUCOSE, CAPILLARY  CBG MONITORING, ED   ____________________________________________  EKG  ED ECG REPORT I, Lavonia Drafts, the attending physician, personally viewed and interpreted this ECG.  Date: 11/29/2017  Rhythm: normal sinus rhythm QRS Axis: normal Intervals: normal ST/T Wave abnormalities: normal Narrative Interpretation: no evidence of acute ischemia  ____________________________________________  RADIOLOGY  None ____________________________________________   PROCEDURES  Procedure(s) performed: No  Procedures   Critical Care performed:  No ____________________________________________   INITIAL IMPRESSION / ASSESSMENT AND PLAN / ED COURSE  Pertinent labs & imaging results that were available during my care of the patient were reviewed by me and considered in my medical decision making (see chart for details).  Patient presents with complaints of weakness, difficulty ambulating.  Discussed at length with family, they do not feel safe caring for the patient.  Lab work is overall unremarkable and noncontributory, urinalysis is normal.   I discussed with the hospitalist who will admit the patient    ____________________________________________   FINAL CLINICAL IMPRESSION(S) / ED DIAGNOSES  Final diagnoses:  Generalized weakness        Note:  This document was prepared using Dragon voice recognition software and may include unintentional dictation errors.  Lavonia Drafts, MD 11/29/17 [phone]

## 2017-11-29 NOTE — ED Notes (Signed)
Daughter at bedside, pt given 2 blankets.

## 2017-11-29 NOTE — Progress Notes (Signed)
Clinical Education officer, museum (CSW) received SNF consult. Patient is in the ED and has [employer]. CSW contacted Dr. Corky Downs and made him aware that patient will not be able to be placed at SNF from the ED because of [employer]. [employer] requires prior authorization which could take several days. CSW made MD aware that patient can go home with home health social worker and work on placement from home. RN case manager aware of above.   [employer], LCSW [phone]

## 2017-11-29 NOTE — Clinical Social Work Note (Signed)
Clinical Social Work Assessment  Patient Details  Name: Alex [name] MRN: [id number] Date of Birth: 1940/10/24  Date of referral:  11/29/17               Reason for consult:  Facility Placement                Permission sought to share information with:  Alex [name] granted to share information::  Yes, Verbal Permission Granted  Name::      Alex::   [name]   Relationship::     Contact Information:     Housing/Transportation Living arrangements for the past 2 months:  Single Family Home Source of Information:  Spouse Patient Interpreter Needed:  None Criminal Activity/Legal Involvement Pertinent to Current Situation/Hospitalization:  No - Comment as needed Significant Relationships:  Adult Children, Spouse Lives with:  Spouse Do you feel safe going back to the place where you live?    Need for family participation in patient care:  Yes (Comment)  Care giving concerns:  Patient lives in [place] with his wife Alex [name].    Social Worker assessment / plan:  Holiday representative (CSW) received SNF consult. Patient was admitted under observation and PT is pending. Per chart patient is not alert and oriented [name] CSW contacted his wife. Per wife patient lives in [place] with her and is connected to the [place]. Per wife patient has dementia and is able to walk without an assistive device. Per wife patient is now weak because of being in the hospital for several days during his last admission. Wife requested SNF placement however stated that if PT recommends home health she would take patient home. CSW explained that PT will evaluate patient and make a recommendation. CSW explained that Alex [name] will have to approve SNF. Wife requested [facility] CSW asked wife to consider other SNFs in case [facility] does not have a bed. Wife said she would have to speak to her daughter. Wife requested for CSW to call the [facility] to see if he  has any SNF benefits. FL2 complete and faxed out. CSW also contacted the local [facility] office and spoke to [employer]. Per Alex [name] patient was denied disability through the [place]. CSW also contacted [facility] and left a voicemail for social worker Alex [name]. CSW will continue to follow and assist as needed.    Employment status:  Disabled (Comment on whether or not currently receiving Disability), Retired Nurse, adult PT Recommendations:  Not assessed at this time Information / Referral to community resources:  [place]  Patient/Family's Response to care:  Patient's wife is agreeable to [employer] in [place].   Patient/Family's Understanding of and Emotional Response to Diagnosis, Current Treatment, and Prognosis:  Patient's wife was pleasant and thanked CSW for assistance.   Emotional Assessment Appearance:  Appears stated age Attitude/Demeanor/Rapport:  Unable to Assess Affect (typically observed):  Unable to Assess Orientation:  Oriented to Self, Oriented to Place, Fluctuating Orientation (Suspected and/or reported Sundowners) Alcohol / Substance use:  Not Applicable Psych involvement (Current and /or in the community):  No (Comment)  Discharge Needs  Concerns to be addressed:  Discharge Planning Concerns Readmission within the last 30 days:  No Current discharge risk:  Dependent with Mobility Barriers to Discharge:  Continued Medical Work up   Alex [name], Alex Beets, LCSW 11/29/2017, 4:13 PM

## 2017-11-29 NOTE — Care Management (Signed)
Spoke with Levada Dy at [facility] yesterday. Informed Levada Dy that Mr. Tramell was being discharged to home. Faxed resumption of orders with discharge summary to [facility]. Spoke with Levada Dy at [facility]. States that Mr Misener is getting physical therapy and an aide. Requested if a Education officer, museum can be added to these services. States she would add Education officer, museum. Will refax discharge summary and resumption information Levada Dy states she will call the home this afternoon. Shelbie Ammons RN MSN CCM Care Management [phone]

## 2017-11-29 NOTE — Clinical Social Work Placement (Signed)
   CLINICAL SOCIAL WORK PLACEMENT  NOTE  Date:  11/29/2017  Patient Details  Name: Alex [name] MRN: [id number] Date of Birth: 1940/09/17  Clinical Social Work is seeking post-discharge placement for this patient at the [place] level of care (*CSW will initial, date and re-position this form in  chart as items are completed):  Yes   Patient/family provided with [place] Work Department's list of facilities offering this level of care within the geographic area requested by the patient (or if unable, by the patient's family).  Yes   Patient/family informed of their freedom to choose among providers that offer the needed level of care, that participate in Medicare, Medicaid or managed care program needed by the patient, have an available bed and are willing to accept the patient.  Yes   Patient/family informed of [facility]'s ownership interest in [facility] and [facility], as well as of the fact that they are under no obligation to receive care at these facilities.  PASRR submitted to EDS on 11/29/17     PASRR number received on 11/29/17     Existing PASRR number confirmed on       FL2 transmitted to all facilities in geographic area requested by pt/family on 11/29/17     FL2 transmitted to all facilities within larger geographic area on       Patient informed that his/her managed care company has contracts with or will negotiate with certain facilities, including the following:            Patient/family informed of bed offers received.  Patient chooses bed at       Physician recommends and patient chooses bed at      Patient to be transferred to   on  .  Patient to be transferred to facility by       Patient family notified on   of transfer.  Name of family member notified:        PHYSICIAN       Additional Comment:    _______________________________________________ Malon Branton, Veronia Beets, LCSW 11/29/2017, 4:12 PM

## 2017-11-29 NOTE — H&P (Signed)
[facility] [place] NAME: Alex [name]    MR#:  [id number]  DATE OF BIRTH:  [birth year]  DATE OF ADMISSION:  11/29/2017  PRIMARY CARE PHYSICIAN: Patient, No Pcp Per   REQUESTING/REFERRING PHYSICIAN: Dr. Corky Downs  CHIEF COMPLAINT:   Chief Complaint  Patient presents with  . Weakness    HISTORY OF PRESENT ILLNESS:  Jatniel Verastegui  is a 77 y.o. male with a known history of eczematous dementia who was discharged from hospital yesterday brought in by wife because patient had a fall at home, blood pressure also was high and lethargic this morning.  Patient never got physical therapy, patient was admitted on August 26, discharged September 2, according to family patient did not get physical therapy even though he was in the hospital for 1 week.  And last night patient fell on his blood, blood pressure was 200s over 120 last night.  Because of concern for deconditioning, ambulatory difficulties, elevated blood pressure, lethargy wife requested that he be monitored and get physical therapy, she is requesting placement.  I see the note from case manager arranging home health physical therapy but wife is very angry and wants him to be admitted.  PAST MEDICAL HISTORY:   Past Medical History:  Diagnosis Date  . Alzheimer's dementia   . BPH (benign prostatic hyperplasia)   . CAD (coronary artery disease)   . Dementia   . Hypertension     PAST SURGICAL HISTOIRY:   Past Surgical History:  Procedure Laterality Date  . CORONARY ANGIOPLASTY WITH STENT PLACEMENT    . HERNIA REPAIR      SOCIAL HISTORY:   Social History   Tobacco Use  . Smoking status: Never Smoker  . Smokeless tobacco: Never Used  Substance Use Topics  . Alcohol use: Yes    Comment: rarely    FAMILY HISTORY:   Family History  Problem Relation Age of Onset  . Hypertension Mother     DRUG ALLERGIES:   Allergies  Allergen Reactions  . Penicillins Other (See  Comments) and Itching    Unknown reaction, childhood  Has patient had a PCN reaction causing immediate rash, facial/tongue/throat swelling, SOB or lightheadedness with hypotension: Yes Has patient had a PCN reaction causing severe rash involving mucus membranes or skin necrosis: No Has patient had a PCN reaction that required hospitalization: No Has patient had a PCN reaction occurring within the last 10 years: No If all of the above answers are "NO", then may proceed with Cephalosporin use.     REVIEW OF SYSTEMS:  CONSTITUTIONAL: No fever, fatigue or weakness.  EYES: No blurred or double vision.  EARS, NOSE, AND THROAT: No tinnitus or ear pain.  RESPIRATORY: No cough, shortness of breath, wheezing or hemoptysis.  CARDIOVASCULAR: No chest pain, orthopnea, edema.  GASTROINTESTINAL: No nausea, vomiting, diarrhea or abdominal pain.  He is requesting  food. GENITOURINARY: No dysuria, hematuria.  ENDOCRINE: No polyuria, nocturia,  HEMATOLOGY: No anemia, easy bruising or bleeding SKIN: No rash or lesion. MUSCULOSKELETAL: No joint pain or arthritis.   NEUROLOGIC: No tingling, numbness, weakness.  PSYCHIATRY: No anxiety or depression.   MEDICATIONS AT HOME:   Prior to Admission medications   Medication Sig Start Date End Date Taking? Authorizing Provider  amLODipine (NORVASC) 5 MG tablet Take 5 mg by mouth daily.   Yes [provider]  aspirin EC 81 MG tablet Take 81 mg by mouth daily.   Yes [provider]  finasteride (  PROSCAR) 5 MG tablet Take 1 tablet (5 mg total) by mouth daily. 06/29/15  Yes Wieting, Richard, MD  lisinopril (PRINIVIL,ZESTRIL) 40 MG tablet Take 40 mg by mouth daily.   Yes [provider]  metoprolol succinate (TOPROL-XL) 25 MG 24 hr tablet Take 25 mg by mouth daily. Take with or immediately following a meal.    Yes [provider]  pravastatin (PRAVACHOL) 80 MG tablet Take 80 mg by mouth daily.   Yes [provider]   rivastigmine (EXELON) 6 MG capsule Take 6 mg by mouth 2 (two) times daily.   Yes [provider]  cloNIDine (CATAPRES) 0.1 MG tablet Take 1 tablet (0.1 mg total) by mouth 2 (two) times daily as needed. Patient not taking: Reported on 11/21/2017 04/28/17   Paulette Blanch, MD  donepezil (ARICEPT) 10 MG tablet Take 1 tablet (10 mg total) by mouth at bedtime. Patient not taking: Reported on 08/15/2016 06/29/15   Loletha Grayer, MD  ibuprofen (ADVIL,MOTRIN) 800 MG tablet Take 1 tablet (800 mg total) by mouth every 8 (eight) hours as needed for moderate pain. Patient not taking: Reported on 11/21/2017 01/30/17   Paulette Blanch, MD      VITAL SIGNS:  Blood pressure (!) 144/93, pulse 84, temperature 98.6 F (37 C), temperature source Oral, resp. rate 20, height 5\' 11"  (1.803 m), weight 95.1 kg, SpO2 96 %.  PHYSICAL EXAMINATION:  GENERAL:  77 y.o.-year-old patient lying in the bed with no acute distress.  EYES: Pupils equal, round, reactive to light and accommodation. No scleral icterus. Extraocular muscles intact.  HEENT: Head atraumatic, normocephalic. Oropharynx and nasopharynx clear.  NECK:  Supple, no jugular venous distention. No thyroid enlargement, no tenderness.  LUNGS: Normal breath sounds bilaterally, no wheezing, rales,rhonchi or crepitation. No use of accessory muscles of respiration.  CARDIOVASCULAR: S1, S2 normal. No murmurs, rubs, or gallops.  ABDOMEN: Soft, nontender, nondistended. Bowel sounds present. No organomegaly or mass.  EXTREMITIES: No pedal edema, cyanosis, or clubbing.  NEUROLOGIC: Cranial nerves II through XII are intact. Muscle strength 5/5 in all extremities. Sensation intact. Gait not checked.  PSYCHIATRIC: The patient is alert and oriented x 3.  SKIN: No obvious rash, lesion, or ulcer.   LABORATORY PANEL:   CBC Recent Labs  Lab 11/29/17 0922  WBC 8.6  HGB 12.2*  HCT 35.0*  PLT 164    ------------------------------------------------------------------------------------------------------------------  Chemistries  Recent Labs  Lab 11/29/17 0922  NA 139  K 3.4*  CL 104  CO2 30  GLUCOSE 107*  BUN 16  CREATININE 1.38*  CALCIUM 8.3*   ------------------------------------------------------------------------------------------------------------------  Cardiac Enzymes No results for input(s): TROPONINI in the last 168 hours. ------------------------------------------------------------------------------------------------------------------  RADIOLOGY:  No results found.  EKG:   Orders placed or performed during the hospital encounter of 11/29/17  . ED EKG  . ED EKG  . EKG 12-Lead  . EKG 12-Lead    IMPRESSION AND PLAN:  #1 lethargy, malignant hypertension, fall last night, presents for last night was high around 200s, here blood pressure is; 160/85. keeping observation, continue his previous medicines, Alekh IV hydralazine 20 mg every 6 hours and see how he does.  Patient CBC, UA did not show any infection. 2.  Ambulatory difficulty, deconditioning: Physical therapy consult, this is the main reason patient's wife brought him here requesting placement.  We will start with physical therapy and see how much he can do.   3.  Recent admission for gastric pneumatosis, followed by surgery, discharged with surgery yesterday,  patient has no abdominal pain, normal white count, she started diet. 4.  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 5.  CAD without chest pain. 6.  History of Alzheimer's dementia 7.  BPH: Continue Proscar.  disCussed with patient's wife and also discussed with patient's daughter Olegario Shearer over the phone.  All the records are reviewed and case discussed with ED provider. Management plans discussed with the patient, family and they are in agreement.  CODE STATUS:full  code  TOTAL TIME TAKING CARE OF THIS PATIENT: 27minutes.    Epifanio Lesches M.D on 11/29/2017 at 2:53 PM  Between 7am to 6pm - Pager - [phone]  After 6pm go to [email or web address] - password [email or web address] [facility]  Office  [phone]  CC: Primary care physician; Patient, No Pcp Per  Note: This dictation was prepared with Dragon dictation along with smaller phrase technology. Any transcriptional errors that result from this process are unintentional.

## 2017-11-29 NOTE — NC FL2 (Addendum)
[place] LEVEL OF CARE SCREENING TOOL     IDENTIFICATION  Patient Name: Alex [name] Birthdate: 02-Jun-1940 Sex: male Admission Date (Current Location): 11/29/2017  [place] and [place] Number:  Engineering geologist and Address:  [facility], [address], [place], [place] [address]      Provider Number: [id number]  Attending Physician Name and Address:  Epifanio Lesches, MD  Relative Name and Phone Number:       Current Level of Care: Hospital Recommended Level of Care: [place] Prior Approval Number:    Date Approved/Denied:   PASRR Number: [id number])  Discharge Plan: SNF    Current Diagnoses: Patient Active Problem List   Diagnosis Date Noted  . Ambulatory dysfunction 11/29/2017  . Pneumatosis of intestines   . Perforated gastric ulcer ([place]) 11/21/2017  . Chest pain 06/27/2015  . Uncontrolled hypertension 06/27/2015  . Bradycardia 06/27/2015  . Pleural thickening 06/27/2015    Orientation RESPIRATION BLADDER Height & Weight     Self, Place  Normal Continent Weight: 209 lb 10.5 oz (95.1 kg) Height:  5\' 11"  (180.3 cm)  BEHAVIORAL SYMPTOMS/MOOD NEUROLOGICAL BOWEL NUTRITION STATUS      Continent Diet(Diet: Heart Healthy )  AMBULATORY STATUS COMMUNICATION OF NEEDS Skin   Extensive Assist Verbally Normal                       Personal Care Assistance Level of Assistance  Bathing, Feeding, Dressing Bathing Assistance: Limited assistance Feeding assistance: Independent Dressing Assistance: Limited assistance     Functional Limitations Info  Sight, Hearing, Speech Sight Info: Adequate Hearing Info: Adequate Speech Info: Adequate    SPECIAL CARE FACTORS FREQUENCY  PT (By licensed PT), OT (By licensed OT)     PT Frequency: (5) OT Frequency: (5)            Contractures      Additional Factors Info  Code Status, Allergies Code Status Info: (Full Code) Allergies Info:  (Penicillins)           Current Medications (11/29/2017):  This is the current hospital active medication list Current Facility-Administered Medications  Medication Dose Route Frequency Provider Last Rate Last Dose  . 0.9 %  sodium chloride infusion   Intravenous Continuous Epifanio Lesches, MD      . acetaminophen (TYLENOL) tablet 650 mg  650 mg Oral Q6H PRN Epifanio Lesches, MD       Or  . acetaminophen (TYLENOL) suppository 650 mg  650 mg Rectal Q6H PRN Epifanio Lesches, MD      . amLODipine (NORVASC) tablet 5 mg  5 mg Oral Daily Epifanio Lesches, MD      . aspirin EC tablet 81 mg  81 mg Oral Daily Epifanio Lesches, MD      . bisacodyl (DULCOLAX) EC tablet 5 mg  5 mg Oral Daily PRN Epifanio Lesches, MD      . docusate sodium (COLACE) capsule 100 mg  100 mg Oral BID Epifanio Lesches, MD      . donepezil (ARICEPT) tablet 10 mg  10 mg Oral QHS Epifanio Lesches, MD      . enoxaparin (LOVENOX) injection 40 mg  40 mg Subcutaneous Q24H Epifanio Lesches, MD      . finasteride (PROSCAR) tablet 5 mg  5 mg Oral Daily Epifanio Lesches, MD      . hydrALAZINE (APRESOLINE) injection 10 mg  10 mg Intravenous Q6H PRN Epifanio Lesches, MD      .  HYDROcodone-acetaminophen (NORCO/VICODIN) 5-325 MG per tablet 1-2 tablet  1-2 tablet Oral Q4H PRN Epifanio Lesches, MD      . metoprolol succinate (TOPROL-XL) 24 hr tablet 25 mg  25 mg Oral Q breakfast Epifanio Lesches, MD      . ondansetron (ZOFRAN) tablet 4 mg  4 mg Oral Q6H PRN Epifanio Lesches, MD       Or  . ondansetron (ZOFRAN) injection 4 mg  4 mg Intravenous Q6H PRN Epifanio Lesches, MD      . potassium chloride SA (K-DUR,KLOR-CON) CR tablet 40 mEq  40 mEq Oral Q4H Epifanio Lesches, MD      . rivastigmine (EXELON) capsule 6 mg  6 mg Oral BID Epifanio Lesches, MD      . traZODone (DESYREL) tablet 25 mg  25 mg Oral QHS PRN Epifanio Lesches, MD         Discharge Medications: Please  see discharge summary for a list of discharge medications.  Relevant Imaging Results:  Relevant Lab Results:   Additional Information (SSN: [id number])  Sample, Veronia Beets, LCSW

## 2017-11-29 NOTE — ED Notes (Signed)
Pt cleaned and changed into gown had incontinent diaper. Pt resposition.

## 2017-11-29 NOTE — ED Triage Notes (Signed)
Pt arrived via EMS from home with reports of weakness. Pt recently dc'd from [facility] after being admitted for gastritis for several days.  Per EMS last night wife reported the patient was hypertensive and did not sleep well.  Per EMS wife states the pt was was not wanting to get out of bed and was more lethargic.    Per EMS wife stated that the patient usually can care for himself and is ambulatory, but fell in the bathroom last night.  Pt has hx of Alzheimer's. Pt is alert to self only

## 2017-11-30 ENCOUNTER — Observation Stay: Payer: Medicare HMO

## 2017-11-30 DIAGNOSIS — R262 Difficulty in walking, not elsewhere classified: Secondary | ICD-10-CM

## 2017-11-30 LAB — BASIC METABOLIC PANEL
Anion gap: 7 (ref 5–15)
BUN: 17 mg/dL (ref 8–23)
CO2: 28 mmol/L (ref 22–32)
Calcium: 8 mg/dL — ABNORMAL LOW (ref 8.9–10.3)
Chloride: 107 mmol/L (ref 98–111)
Creatinine, Ser: 1.31 mg/dL — ABNORMAL HIGH (ref 0.61–1.24)
GFR calc Af Amer: 59 mL/min — ABNORMAL LOW (ref >60)
GFR calc non Af Amer: 51 mL/min — ABNORMAL LOW (ref >60)
Glucose, Bld: 94 mg/dL (ref 70–99)
Potassium: 3.8 mmol/L (ref 3.5–5.1)
Sodium: 142 mmol/L (ref 135–145)

## 2017-11-30 LAB — CBC
HCT: 31.8 % — ABNORMAL LOW (ref 40.0–52.0)
Hemoglobin: 11.2 g/dL — ABNORMAL LOW (ref 13.0–18.0)
MCH: 32 pg (ref 26.0–34.0)
MCHC: 35.4 g/dL (ref 32.0–36.0)
MCV: 90.4 fL (ref 80.0–100.0)
Platelets: 170 10*3/uL (ref 150–440)
RBC: 3.52 MIL/uL — ABNORMAL LOW (ref 4.40–5.90)
RDW: 13.9 % (ref 11.5–14.5)
WBC: 8.1 10*3/uL (ref 3.8–10.6)

## 2017-11-30 LAB — AMMONIA: Ammonia: 25 umol/L (ref 9–35)

## 2017-11-30 IMAGING — CR DG LUMBAR SPINE 2-3V
1 series · 3 of 3 positions shown · non-contrast
Comparison: CT chest, abdomen and pelvis [DATE].

CLINICAL DATA: Low back pain.  No known injury.

EXAM:
LUMBAR SPINE - 2-3 VIEW

[Series 1: dg lumbar spine 2-3 views · 0.14mm/px · 3 of 3 slices shown]
[im 1/3]
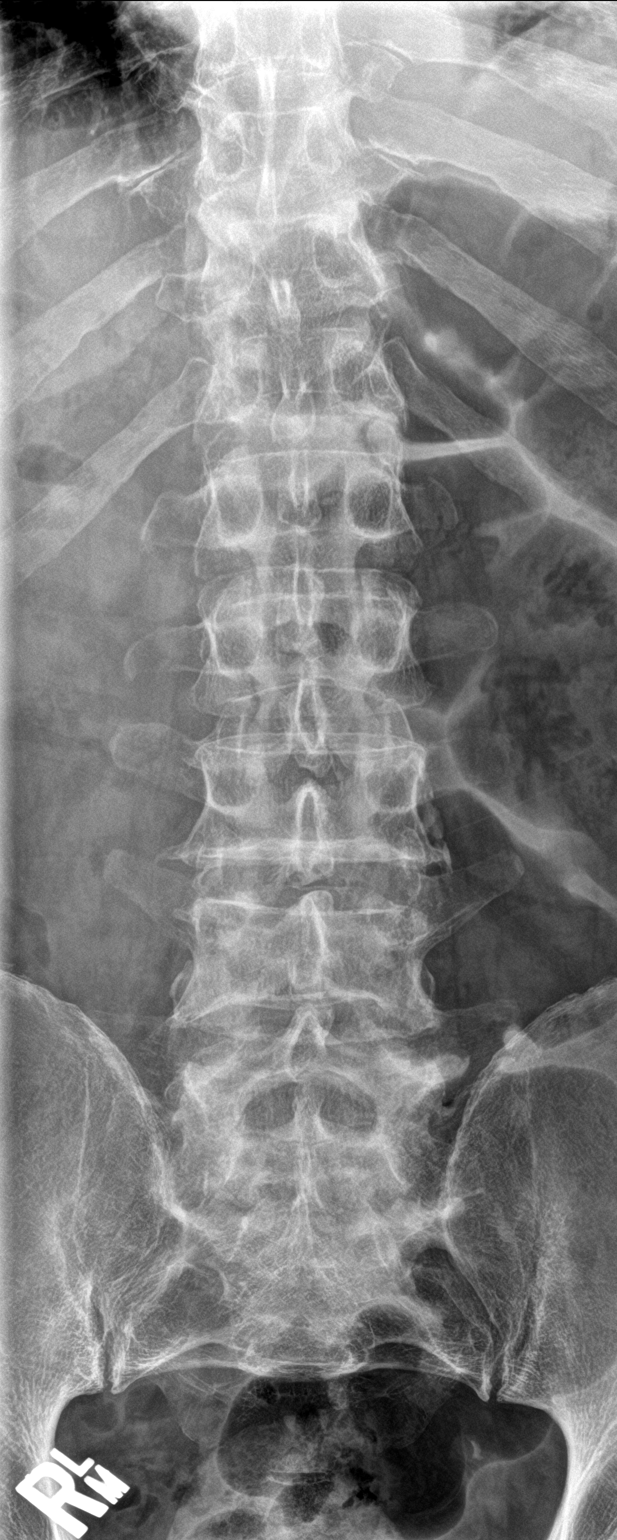
[im 2/3]
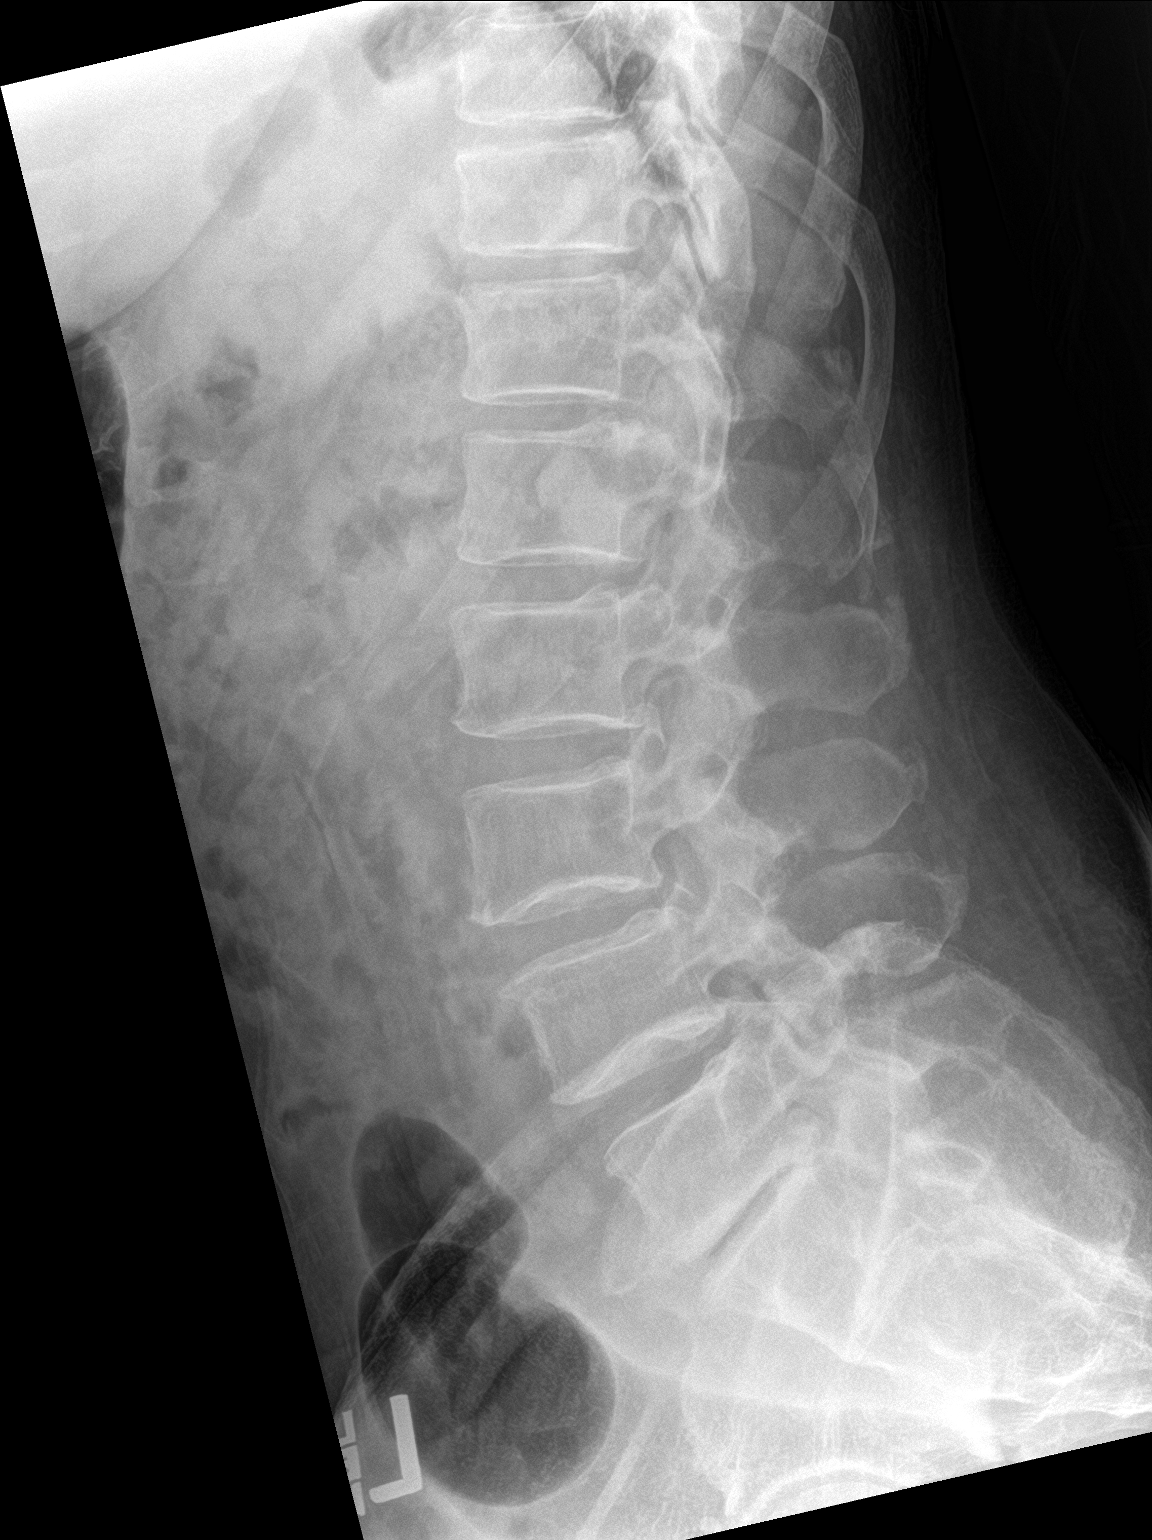
[im 3/3]
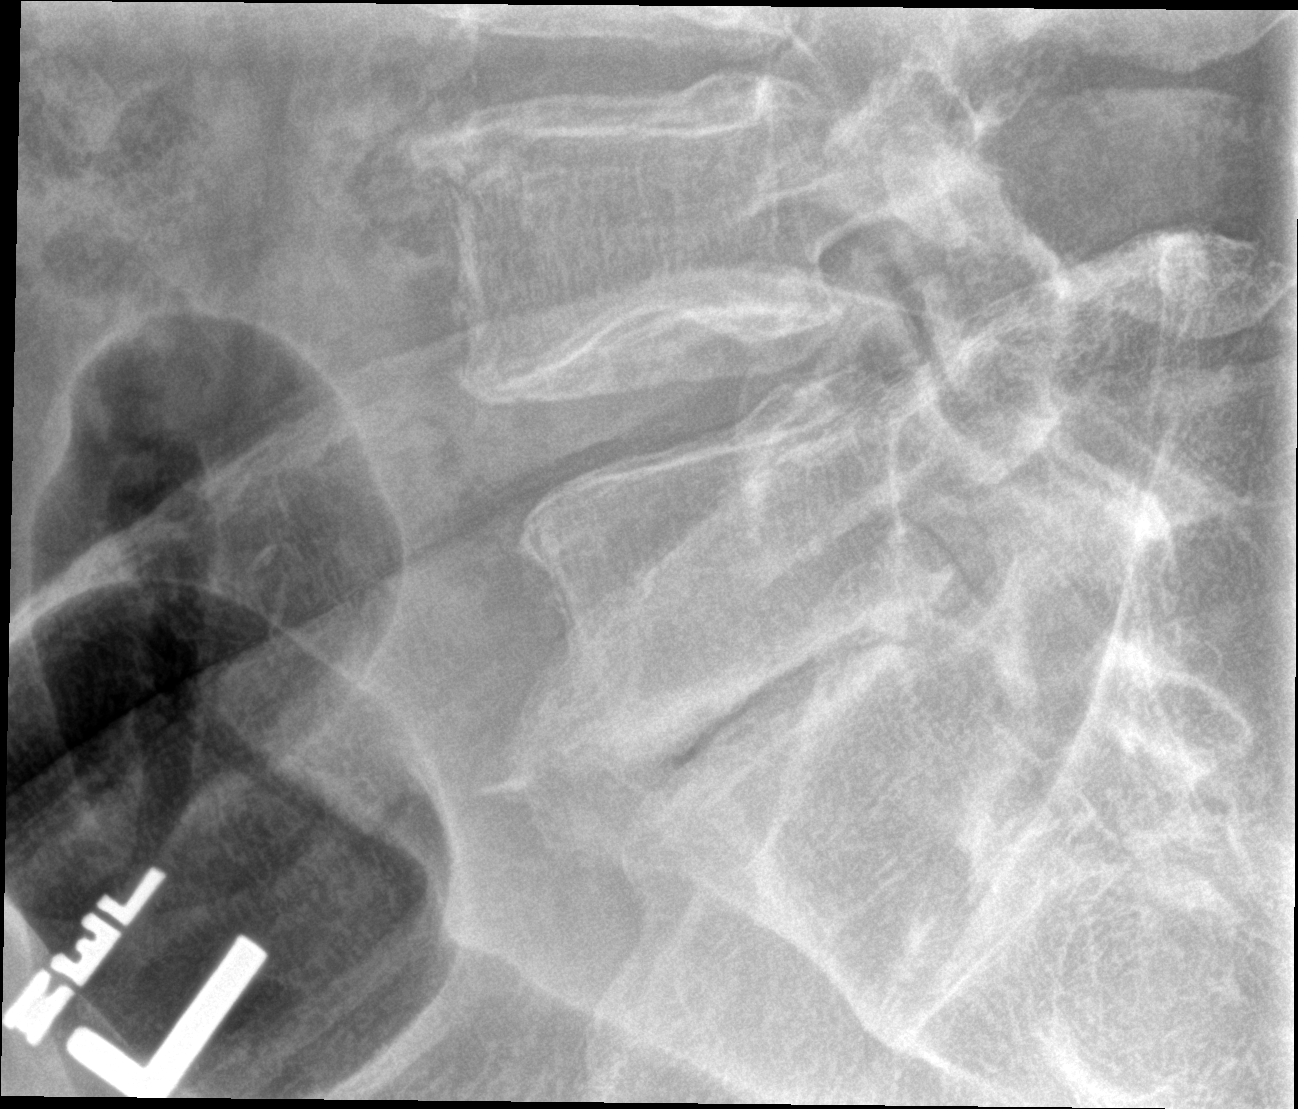

[3 of 3 positions shown; findings below may reference images not displayed]

FINDINGS: There is no fracture or malalignment. Loss of disc space height
vacuum disc phenomenon are seen at L5-S1. Paraspinous structures are
unremarkable.
IMPRESSION: No acute finding.

Degenerative disc disease L5-S1.

## 2017-11-30 IMAGING — CR DG PELVIS 1-2V
1 series · 1 of 1 positions shown · non-contrast
Comparison: None.

CLINICAL DATA: Pelvic pain.  Status post fall.  Initial encounter.

EXAM:
PELVIS - 1-2 VIEW

[dg pelvis 1-2 views]
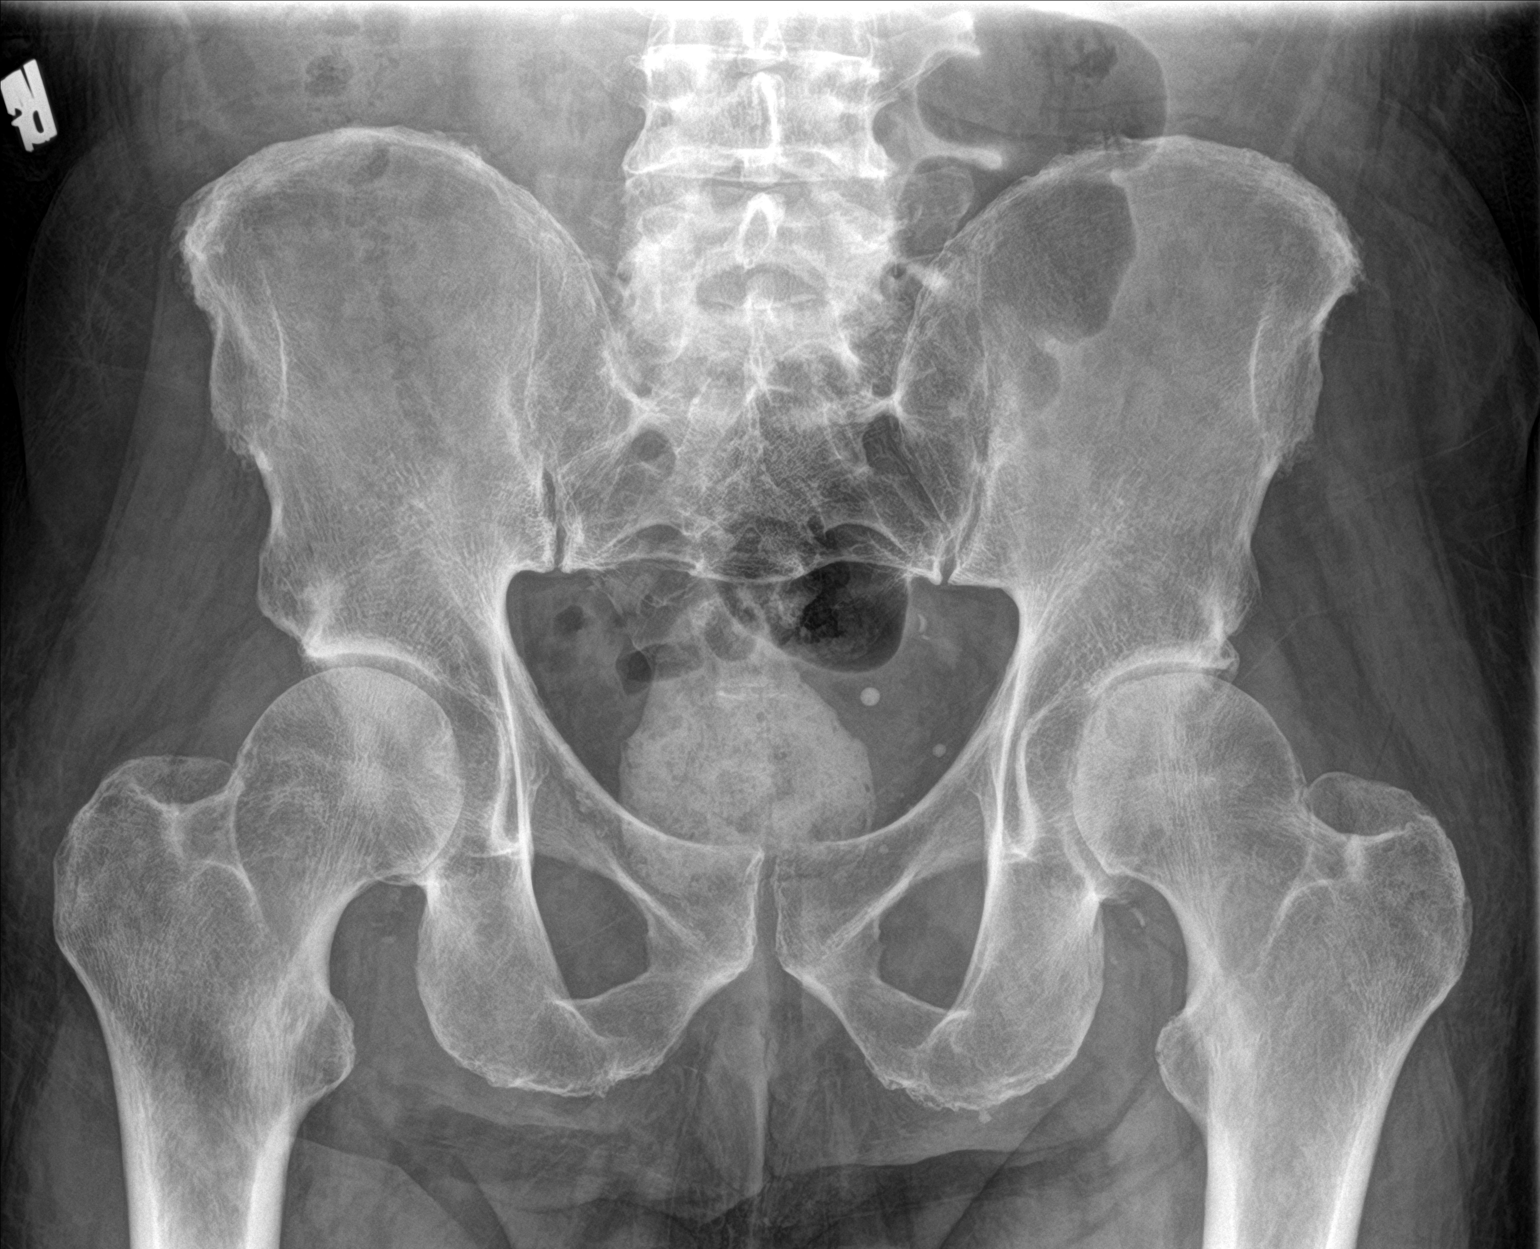

[1 of 1 positions shown; findings below may reference images not displayed]

FINDINGS: There is no acute bony or joint abnormality. Mild to moderate left
hip osteoarthritis is noted. Joint spaces are otherwise preserved.
No worrisome bony lesion.
IMPRESSION: No acute finding.

Mild to moderate left hip osteoarthritis.

## 2017-11-30 IMAGING — CT CT HEAD W/O CM
3 series · 15 of 46 positions shown, 18 images · non-contrast
Comparison: None.

CLINICAL DATA: 76 y/o  M; weakness and fall in bathroom today.

EXAM:
CT HEAD WITHOUT CONTRAST
TECHNIQUE: Contiguous axial images were obtained from the base of the skull
through the vertex without intravenous contrast.

[Series 3: head wo · axial · 0.43mm/px · z∈[+598,+718]mm · 9 of 29 slices shown, 12 images]
[im 3/29  brain]
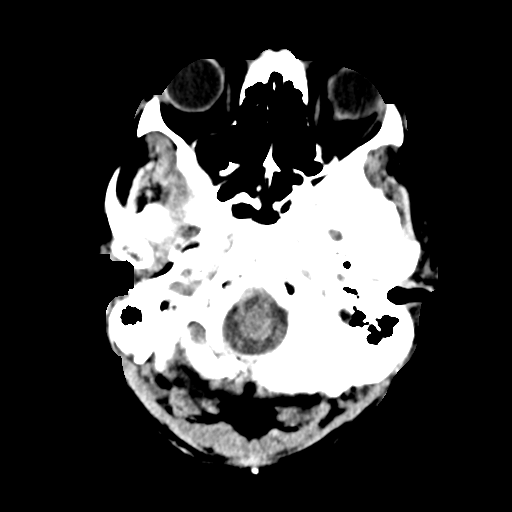
[im 3/29  bone]
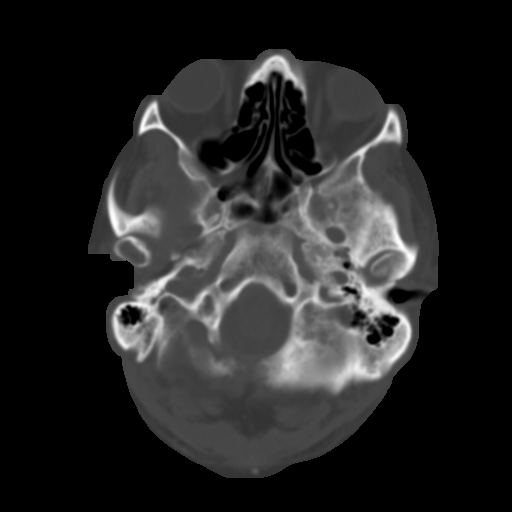
[im 6/29  brain]
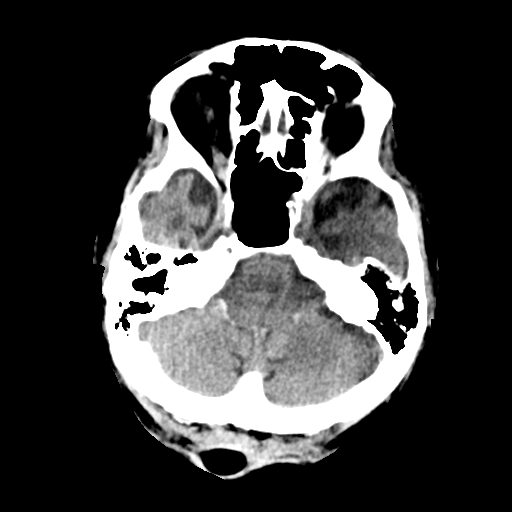
[im 9/29  brain]
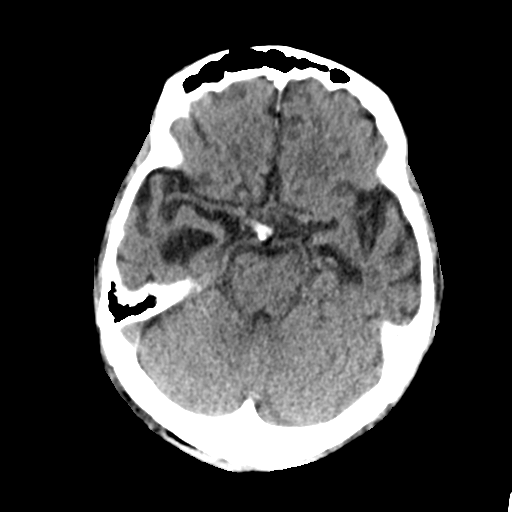
[im 12/29  brain]
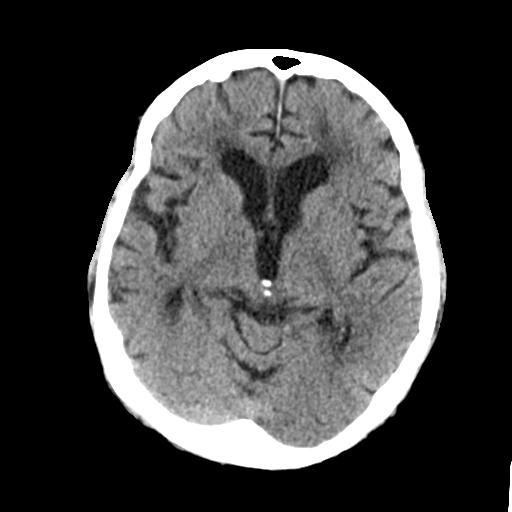
[im 15/29  brain]
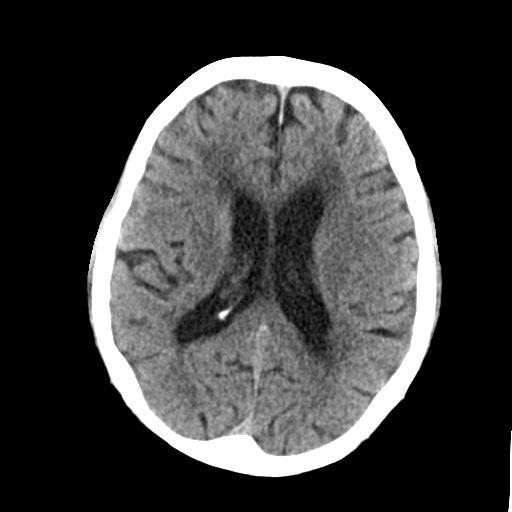
[im 15/29  bone]
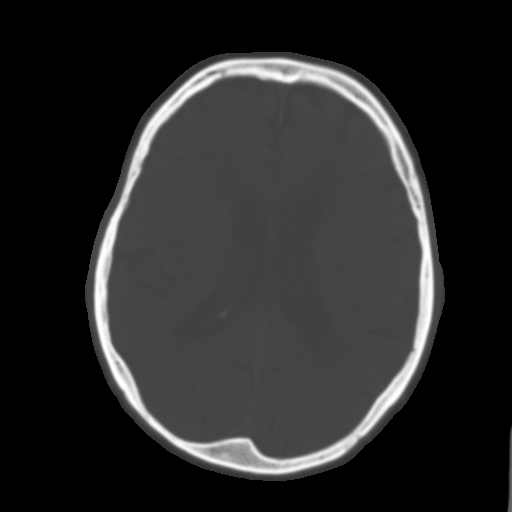
[im 18/29  brain]
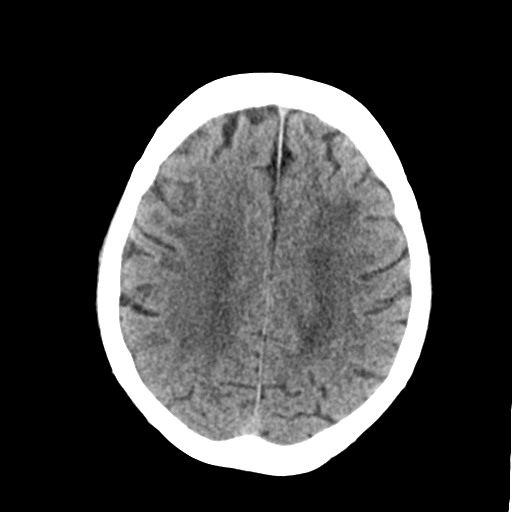
[im 21/29  brain]
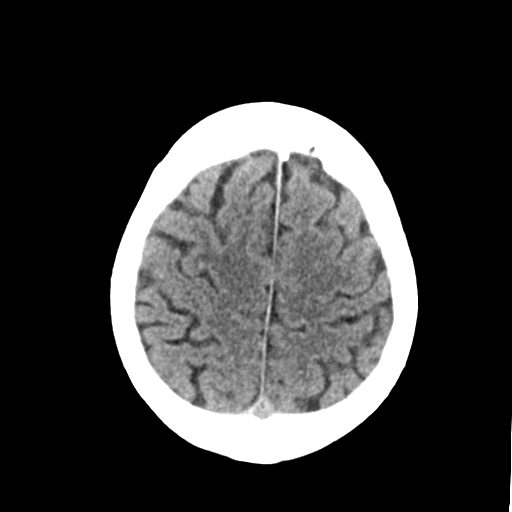
[im 24/29  brain]
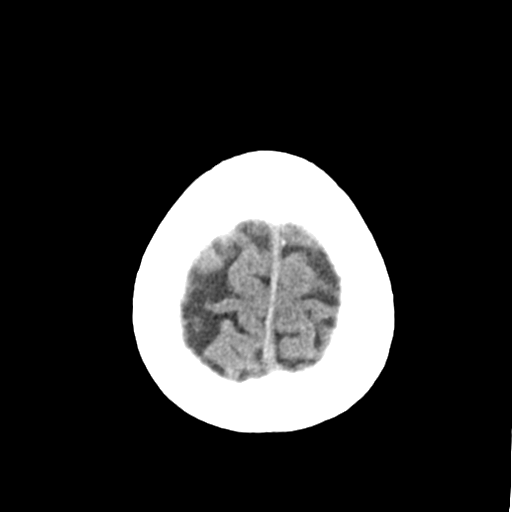
[im 27/29  brain]
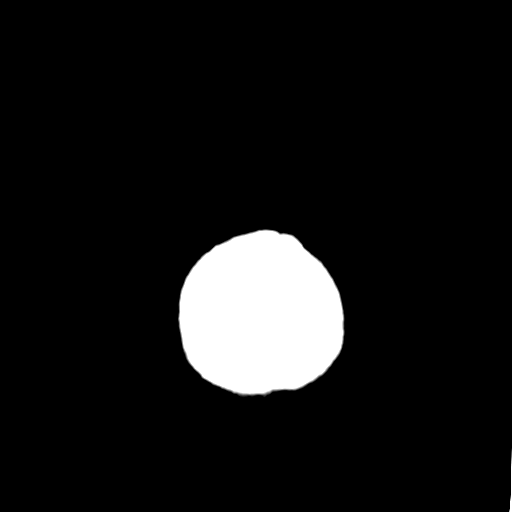
[im 27/29  bone]
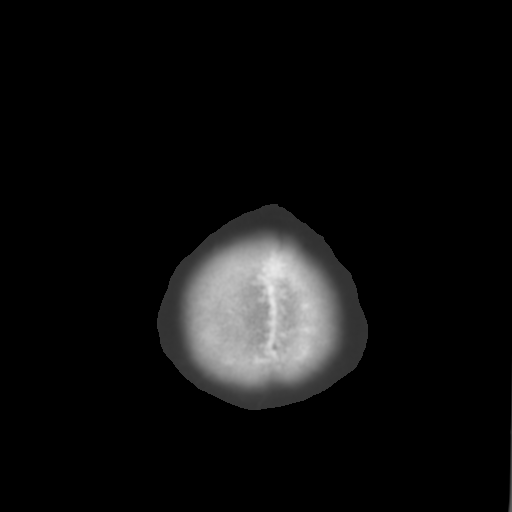

[Series 4: coronal soft tissue · coronal · 0.31mm/px · 3 of 66 slices shown]
[im 22/66  brain]
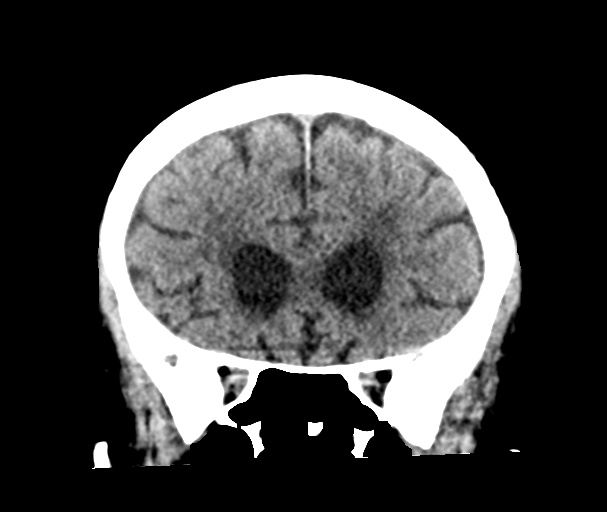
[im 29/66  brain]
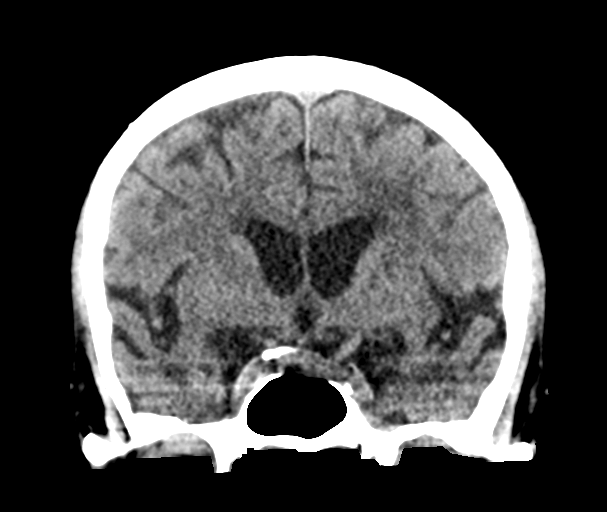
[im 37/66  brain]
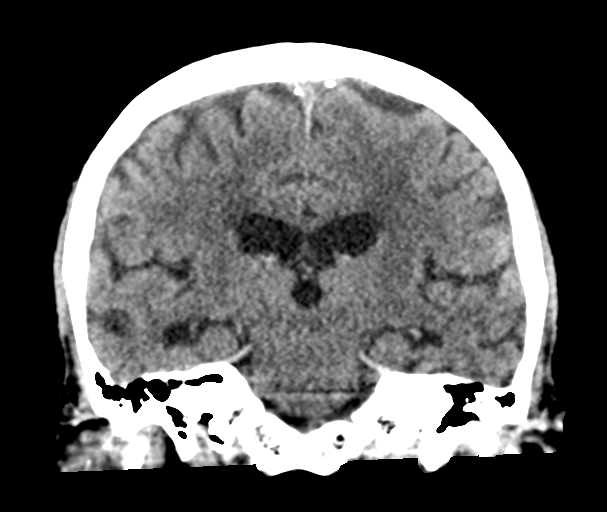

[Series 5: sagittal soft tissue · sagittal · 0.31mm/px · 3 of 55 slices shown]
[im 19/55  brain]
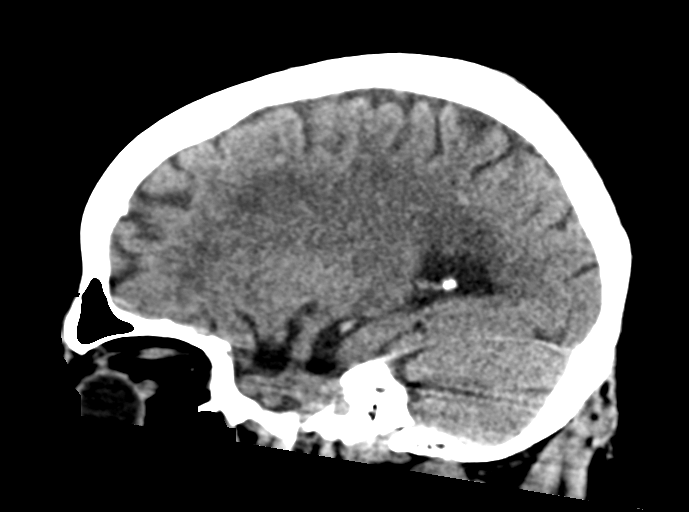
[im 28/55  brain]
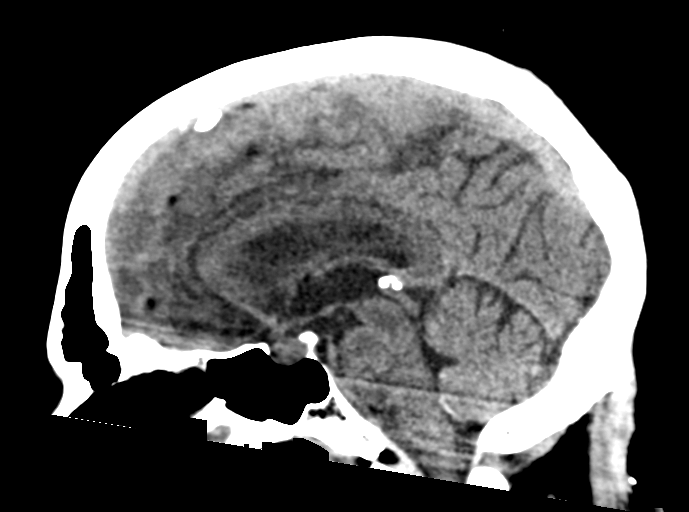
[im 37/55  brain]
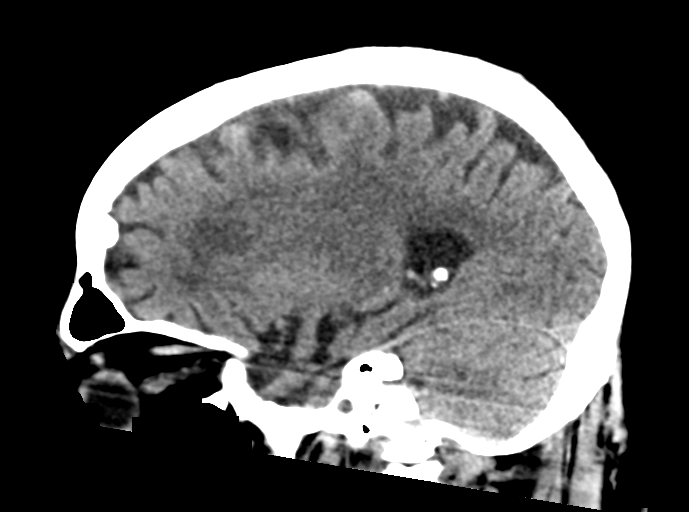

[15 of 46 positions shown; findings below may reference images not displayed]

FINDINGS: Brain: No evidence of acute infarction, hemorrhage, hydrocephalus,
extra-axial collection or mass lesion/mass effect. Chronic
microvascular ischemic changes and volume loss of the brain. Volume
loss is asymmetrically prominent in the medial temporal lobes.

Vascular: Calcific atherosclerosis of carotid siphons. No hyperdense
vessel.

Skull: Normal. Negative for fracture or focal lesion.

Sinuses/Orbits: No acute finding.

Other: Suboccipital scalp lipoma measuring 11 x 20 mm (AP by ML
series 6, image 15).
IMPRESSION: 1. No acute intracranial abnormality identified.
2. Mild chronic microvascular ischemic changes and volume loss of
the brain for age. Volume loss is asymmetrically prominent in the
medial temporal lobes.
3. 20 mm suboccipital scalp lipoma.

By: INTERVIU M.D.

## 2017-11-30 MED ORDER — GI COCKTAIL ~~LOC~~
30.0000 mL | Freq: Two times a day (BID) | ORAL | Status: DC | PRN
  Filled 2017-11-30: qty 30

## 2017-11-30 MED ORDER — PRAVASTATIN SODIUM 20 MG PO TABS
80.0000 mg | ORAL_TABLET | Freq: Every day | ORAL | Status: DC
  Administered 2017-11-30 – 2017-12-02 (×3): 80 mg via ORAL
  Filled 2017-11-30 (×3): qty 4

## 2017-11-30 MED ORDER — RIVASTIGMINE TARTRATE 3 MG PO CAPS
6.0000 mg | ORAL_CAPSULE | Freq: Two times a day (BID) | ORAL | Status: DC
  Administered 2017-11-30 – 2017-12-05 (×7): 6 mg via ORAL
  Filled 2017-11-30 (×12): qty 2

## 2017-11-30 NOTE — Progress Notes (Signed)
PT Cancellation Note  Patient Details Name: Alex [name] MRN: [id number] DOB: 07/30/1940   Cancelled Treatment:    Reason Eval/Treat Not Completed: Patient at procedure or test/unavailable.  Pt currently not in room (off floor for imaging).  Will re-attempt PT evaluation at a later date/time (nurse and SW notified).  Leitha Bleak, PT 11/30/17, 3:25 PM [phone]

## 2017-11-30 NOTE — Progress Notes (Signed)
eeg completed ° °

## 2017-11-30 NOTE — Progress Notes (Signed)
[facility] [place] NAME: Alex [name]    MR#:  [id number]  DATE OF BIRTH:  [birth year]  SUBJECTIVE:  CHIEF COMPLAINT:   Chief Complaint  Patient presents with  . Weakness  confused at baseline, per wife-patient with back pain and hip pain, imaging of lumbar spine and pelvis noted for left moderate hip osteoarthritis, case discussed with case management/family-wife would like to have the patient evaluated for possible placement, physical therapy to see  REVIEW OF SYSTEMS:  CONSTITUTIONAL: No fever, fatigue or weakness.  EYES: No blurred or double vision.  EARS, NOSE, AND THROAT: No tinnitus or ear pain.  RESPIRATORY: No cough, shortness of breath, wheezing or hemoptysis.  CARDIOVASCULAR: No chest pain, orthopnea, edema.  GASTROINTESTINAL: No nausea, vomiting, diarrhea or abdominal pain.  GENITOURINARY: No dysuria, hematuria.  ENDOCRINE: No polyuria, nocturia,  HEMATOLOGY: No anemia, easy bruising or bleeding SKIN: No rash or lesion. MUSCULOSKELETAL: No joint pain or arthritis.   NEUROLOGIC: No tingling, numbness, weakness.  PSYCHIATRY: No anxiety or depression.   ROS  DRUG ALLERGIES:   Allergies  Allergen Reactions  . Penicillins Other (See Comments) and Itching    Unknown reaction, childhood  Has patient had a PCN reaction causing immediate rash, facial/tongue/throat swelling, SOB or lightheadedness with hypotension: Yes Has patient had a PCN reaction causing severe rash involving mucus membranes or skin necrosis: No Has patient had a PCN reaction that required hospitalization: No Has patient had a PCN reaction occurring within the last 10 years: No If all of the above answers are "NO", then may proceed with Cephalosporin use.     VITALS:  Blood pressure 119/82, pulse 70, temperature 98.2 F (36.8 C), temperature source Oral, resp. rate 18, height 5\' 11"  (1.803 m), weight 97.3 kg, SpO2 96 %.  PHYSICAL EXAMINATION:   GENERAL:  77 y.o.-year-old patient lying in the bed with no acute distress.  EYES: Pupils equal, round, reactive to light and accommodation. No scleral icterus. Extraocular muscles intact.  HEENT: Head atraumatic, normocephalic. Oropharynx and nasopharynx clear.  NECK:  Supple, no jugular venous distention. No thyroid enlargement, no tenderness.  LUNGS: Normal breath sounds bilaterally, no wheezing, rales,rhonchi or crepitation. No use of accessory muscles of respiration.  CARDIOVASCULAR: S1, S2 normal. No murmurs, rubs, or gallops.  ABDOMEN: Soft, nontender, nondistended. Bowel sounds present. No organomegaly or mass.  EXTREMITIES: No pedal edema, cyanosis, or clubbing.  NEUROLOGIC: Cranial nerves II through XII are intact. Muscle strength 5/5 in all extremities. Sensation intact. Gait not checked.  PSYCHIATRIC: The patient is alert and oriented x 3.  SKIN: No obvious rash, lesion, or ulcer.   Physical Exam LABORATORY PANEL:   CBC Recent Labs  Lab 11/30/17 0408  WBC 8.1  HGB 11.2*  HCT 31.8*  PLT 170   ------------------------------------------------------------------------------------------------------------------  Chemistries  Recent Labs  Lab 11/30/17 0408  NA 142  K 3.8  CL 107  CO2 28  GLUCOSE 94  BUN 17  CREATININE 1.31*  CALCIUM 8.0*   ------------------------------------------------------------------------------------------------------------------  Cardiac Enzymes No results for input(s): TROPONINI in the last 168 hours. ------------------------------------------------------------------------------------------------------------------  RADIOLOGY:  Dg Lumbar Spine 2-3 Views  Result Date: 11/30/2017 CLINICAL DATA:  Low back pain.  No known injury. EXAM: LUMBAR SPINE - 2-3 VIEW COMPARISON:  CT chest, abdomen and pelvis 11/21/2017. FINDINGS: There is no fracture or malalignment. Loss of disc space height vacuum disc phenomenon are seen at L5-S1. Paraspinous  structures are unremarkable. IMPRESSION: No acute finding. Degenerative disc disease  L5-S1. Electronically Signed   By: Inge Rise M.D.   On: 11/30/2017 11:29   Dg Pelvis 1-2 Views  Result Date: 11/30/2017 CLINICAL DATA:  Pelvic pain.  Status post fall.  Initial encounter. EXAM: PELVIS - 1-2 VIEW COMPARISON:  None. FINDINGS: There is no acute bony or joint abnormality. 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 Joint spaces are otherwise preserved. No worrisome bony lesion. IMPRESSION: No acute finding. 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 Electronically Signed   By: Inge Rise M.D.   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  Disposition pending clinical course  All the records are reviewed and case discussed with Care Management/Social Workerr. Management plans discussed with the patient, family and they are in agreement.  CODE STATUS:full  TOTAL TIME TAKING CARE OF THIS PATIENT: 40 minutes.     POSSIBLE D/C IN 02 DAYS, DEPENDING ON CLINICAL CONDITION.   Avel Peace Salary M.D on 11/30/2017   Between 7am to 6pm - Pager - [phone]  After 6pm go to [email or web address] - password [email or web address] [facility]   Office  [phone]  CC: Primary care physician; Patient, No Pcp Per  Note: This dictation was prepared with Dragon dictation along with smaller phrase technology. Any transcriptional errors that result from this process are unintentional.

## 2017-11-30 NOTE — Progress Notes (Signed)
PT Cancellation Note  Patient Details Name: Alex [name] MRN: [id number] DOB: July 05, 1940   Cancelled Treatment:    Reason Eval/Treat Not Completed: Patient at procedure or test/unavailable;Other (comment).  PT consult received.  Chart reviewed.  Pt currently off unit for imaging (has lumbar spine and pelvis x-ray ordered).  Will re-attempt PT evaluation once results of imaging are known and pt is medically appropriate to participate in physical therapy.  Leitha Bleak, PT 11/30/17, 10:30 AM [phone]

## 2017-11-30 NOTE — Consult Note (Signed)
Reason for Consult: Fall Referring Physician: Salary  CC: Fall  HPI: Alex [name] is an 77 y.o. male with a history of dementia for at least the last 8-9 years that has been progressive.  Was recently admitted with pneumatosis of the intestines.  Was discharged on 9/2.  Per report of family has been weak at home.  Was in the bathroom by himself and family heard him fall.  Circumstances involved with fall are unknown since patient was in the bathroom alone.  Patient is amnestic of having a fall.    Past Medical History:  Diagnosis Date  . Alzheimer's dementia   . BPH (benign prostatic hyperplasia)   . CAD (coronary artery disease)   . Dementia   . Hypertension     Past Surgical History:  Procedure Laterality Date  . CORONARY ANGIOPLASTY WITH STENT PLACEMENT    . HERNIA REPAIR      Family History  Problem Relation Age of Onset  . Hypertension Mother     Social History:  reports that he has never smoked. He has never used smokeless tobacco. He reports that he drinks alcohol. He reports that he does not use drugs.  Allergies  Allergen Reactions  . Penicillins Other (See Comments) and Itching    Unknown reaction, childhood  Has patient had a PCN reaction causing immediate rash, facial/tongue/throat swelling, SOB or lightheadedness with hypotension: Yes Has patient had a PCN reaction causing severe rash involving mucus membranes or skin necrosis: No Has patient had a PCN reaction that required hospitalization: No Has patient had a PCN reaction occurring within the last 10 years: No If all of the above answers are "NO", then may proceed with Cephalosporin use.     Medications:  I have reviewed the patient's current medications. Prior to Admission:  Medications Prior to Admission  Medication Sig Dispense Refill Last Dose  . amLODipine (NORVASC) 5 MG tablet Take 5 mg by mouth daily.   Unknown at Unknown  . aspirin EC 81 MG tablet Take 81 mg by mouth daily.   Unknown at Unknown   . finasteride (PROSCAR) 5 MG tablet Take 1 tablet (5 mg total) by mouth daily. 30 tablet 0 Unknown at Unknown  . lisinopril (PRINIVIL,ZESTRIL) 40 MG tablet Take 40 mg by mouth daily.     . metoprolol succinate (TOPROL-XL) 25 MG 24 hr tablet Take 25 mg by mouth daily. Take with or immediately following a meal.    Unknown at Unknown  . pravastatin (PRAVACHOL) 80 MG tablet Take 80 mg by mouth daily.   Unknown at Unknown  . rivastigmine (EXELON) 6 MG capsule Take 6 mg by mouth 2 (two) times daily.   Unknown at Unknown  . cloNIDine (CATAPRES) 0.1 MG tablet Take 1 tablet (0.1 mg total) by mouth 2 (two) times daily as needed. (Patient not taking: Reported on 11/21/2017) 20 tablet 0 Not Taking at Unknown time  . donepezil (ARICEPT) 10 MG tablet Take 1 tablet (10 mg total) by mouth at bedtime. (Patient not taking: Reported on 08/15/2016) 30 tablet 0 Not Taking at Unknown time  . ibuprofen (ADVIL,MOTRIN) 800 MG tablet Take 1 tablet (800 mg total) by mouth every 8 (eight) hours as needed for moderate pain. (Patient not taking: Reported on 11/21/2017) 15 tablet 0 Not Taking at Unknown time   Scheduled: . amLODipine  5 mg Oral Daily  . aspirin EC  81 mg Oral Daily  . docusate sodium  100 mg Oral BID  . enoxaparin (LOVENOX) injection  40 mg Subcutaneous Q24H  . finasteride  5 mg Oral Daily  . metoprolol succinate  25 mg Oral Q breakfast  . pravastatin  80 mg Oral q1800  . rivastigmine  6 mg Oral BID WC    ROS: History obtained from the patient  General ROS: negative for - chills, fatigue, fever, night sweats, weight gain or weight loss Psychological ROS: negative for - behavioral disorder, hallucinations, memory difficulties, mood swings or suicidal ideation Ophthalmic ROS: negative for - blurry vision, double vision, eye pain or loss of vision ENT ROS: negative for - epistaxis, nasal discharge, oral lesions, sore throat, tinnitus or vertigo Allergy and Immunology ROS: negative for - hives or  itchy/watery eyes Hematological and Lymphatic ROS: negative for - bleeding problems, bruising or swollen lymph nodes Endocrine ROS: negative for - galactorrhea, hair pattern changes, polydipsia/polyuria or temperature intolerance Respiratory ROS: negative for - cough, hemoptysis, shortness of breath or wheezing Cardiovascular ROS: negative for - chest pain, dyspnea on exertion, edema or irregular heartbeat Gastrointestinal ROS: negative for - abdominal pain, diarrhea, hematemesis, nausea/vomiting or stool incontinence Genito-Urinary ROS: negative for - dysuria, hematuria, incontinence or urinary frequency/urgency Musculoskeletal ROS: right leg heaviness Neurological ROS: as noted in HPI Dermatological ROS: negative for rash and skin lesion changes  Physical Examination: Blood pressure 119/82, pulse 70, temperature 98.2 F (36.8 C), temperature source Oral, resp. rate 18, height 5\' 11"  (1.803 m), weight 97.3 kg, SpO2 96 %.  HEENT-  Normocephalic, no lesions, without obvious abnormality.  Normal external eye and conjunctiva.  Normal TM's bilaterally.  Normal auditory canals and external ears. Normal external nose, mucus membranes and septum.  Normal pharynx. Cardiovascular- S1, S2 normal, pulses palpable throughout   Lungs- chest clear, no wheezing, rales, normal symmetric air entry Abdomen- soft, non-tender; bowel sounds normal; no masses,  no organomegaly Extremities- no edema Lymph-no adenopathy palpable Musculoskeletal-no joint tenderness, deformity or swelling Skin-warm and dry, no hyperpigmentation, vitiligo, or suspicious lesions  Neurological Examination   Mental Status: Alert and awake.  Speech fluent without evidence of aphasia.  Able to follow 3 step commands with some mild reinforcement required. Cranial Nerves: II: Discs flat bilaterally; Visual fields grossly normal, pupils equal, round, reactive to light and accommodation III,IV, VI: ptosis not present, extra-ocular motions  intact bilaterally V,VII: smile symmetric, facial light touch sensation decreased on the right VIII: hearing normal bilaterally IX,X: gag reflex present XI: bilateral shoulder shrug XII: midline tongue extension Motor: Right : Upper extremity   5/5    Left:     Upper extremity   5/5  Lower extremity   5-/5     Lower extremity   5/5 Tone and bulk:normal tone throughout; no atrophy noted Sensory: Pinprick and light touch intact throughout, bilaterally Deep Tendon Reflexes: 2+ and symmetric with absent AJ's bilaterally Plantars: Right: mute   Left: mute Cerebellar: Normal finger-to-nose and normal heel-to-shin testing bilaterally Gait: not tested due to safety concerns    Laboratory Studies:   Basic Metabolic Panel: Recent Labs  Lab 11/25/17 0451 11/26/17 0941 11/27/17 0600 11/29/17 0922 11/30/17 0408  NA 144 142 139 139 142  K 3.3* 3.5 3.6 3.4* 3.8  CL 111 108 107 104 107  CO2 29 27 25 30 28   GLUCOSE 118* 124* 102* 107* 94  BUN 14 11 10 16 17   CREATININE 1.54* 1.43* 1.20 1.38* 1.31*  CALCIUM 7.9* 8.0* 7.9* 8.3* 8.0*  PHOS  --  2.8  --   --   --  Liver Function Tests: Recent Labs  Lab 11/26/17 0941  ALBUMIN 2.6*   No results for input(s): LIPASE, AMYLASE in the last 168 hours. Recent Labs  Lab 11/30/17 0951  AMMONIA 25    CBC: Recent Labs  Lab 11/24/17 0435 11/25/17 0451 11/26/17 0403 11/29/17 0922 11/30/17 0408  WBC 11.6* 8.6 7.7 8.6 8.1  HGB 12.7* 11.5* 11.1* 12.2* 11.2*  HCT 37.6* 33.6* 31.8* 35.0* 31.8*  MCV 90.0 90.7 90.7 90.5 90.4  PLT 95* 94* 101* 164 170    Cardiac Enzymes: No results for input(s): CKTOTAL, CKMB, CKMBINDEX, TROPONINI in the last 168 hours.  BNP: Invalid input(s): POCBNP  CBG: Recent Labs  Lab 11/29/17 0935  GLUCAP 2    Microbiology: Results for orders placed or performed during the hospital encounter of 08/15/16  Urine culture     Status: None   Collection Time: 08/15/16  7:19 PM  Result Value Ref Range  Status   Specimen Description URINE, RANDOM  Final   Special Requests NONE  Final   Culture   Final    NO GROWTH Performed at [facility], [place] [address], [place], [place] [address]    Report Status 08/17/2016 FINAL  Final    Coagulation Studies: No results for input(s): LABPROT, INR in the last 72 hours.  Urinalysis:  Recent Labs  Lab 11/29/17 0926  COLORURINE STRAW*  LABSPEC 1.006  PHURINE 7.0  GLUCOSEU NEGATIVE  HGBUR NEGATIVE  BILIRUBINUR NEGATIVE  KETONESUR NEGATIVE  PROTEINUR NEGATIVE  NITRITE NEGATIVE  LEUKOCYTESUR NEGATIVE    Lipid Panel:     Component Value Date/Time   CHOL 153 06/27/2015 1908   TRIG 35 06/27/2015 1908   HDL 50 06/27/2015 1908   CHOLHDL 3.1 06/27/2015 1908   VLDL 7 06/27/2015 1908   LDLCALC 96 06/27/2015 1908    HgbA1C:  Lab Results  Component Value Date   HGBA1C 5.4 06/27/2015    Urine Drug Screen:      Component Value Date/Time   LABOPIA NONE DETECTED 04/28/2017 0039   COCAINSCRNUR NONE DETECTED 04/28/2017 0039   LABBENZ NONE DETECTED 04/28/2017 0039   AMPHETMU NONE DETECTED 04/28/2017 0039   THCU NONE DETECTED 04/28/2017 0039   LABBARB NONE DETECTED 04/28/2017 0039    Alcohol Level: No results for input(s): ETH in the last 168 hours.  Other results: EKG: sinus rhythm at 76 bpm.  Imaging: Dg Lumbar Spine 2-3 Views  Result Date: 11/30/2017 CLINICAL DATA:  Low back pain.  No known injury. EXAM: LUMBAR SPINE - 2-3 VIEW COMPARISON:  CT chest, abdomen and pelvis 11/21/2017. FINDINGS: There is no fracture or malalignment. Loss of disc space height vacuum disc phenomenon are seen at L5-S1. Paraspinous structures are unremarkable. IMPRESSION: No acute finding. Degenerative disc disease L5-S1. Electronically Signed   By: Inge Rise M.D.   On: 11/30/2017 11:29   Dg Pelvis 1-2 Views  Result Date: 11/30/2017 CLINICAL DATA:  Pelvic pain.  Status post fall.  Initial encounter. EXAM: PELVIS - 1-2 VIEW COMPARISON:  None.  FINDINGS: There is no acute bony or joint abnormality. 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 Joint spaces are otherwise preserved. No worrisome bony lesion. IMPRESSION: No acute finding. 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 Electronically Signed   By: Inge Rise M.D.   On: 11/30/2017 11:28     Assessment/Plan: 77 year old male with a history of dementia presenting after an unwitnessed fall.  Patient is unable to give any account of the fall.  Family feels patient just became weak.  Although this is possibility and fall may have been related to debilitation from recent hospitalization will rule out other possibilities.    Recommendations: 1.  EEG 2.  Head CT without contrast 3.  Orthostatics  Alexis Goodell, MD Neurology [phone] 11/30/2017, 2:40 PM

## 2017-11-30 NOTE — Progress Notes (Signed)
Physical Therapy Evaluation Patient Details Name: Alex [name] MRN: [id number] DOB: 05/10/1940 Today's Date: 11/30/2017   History of Present Illness  Alex [name]  is a 77 y.o. male with a known history of eczematous dementia who was discharged from hospital yesterday brought in by wife because patient had a fall at home, blood pressure also was high and lethargic this morning.  Patient never got physical therapy, patient was admitted on August 26, discharged September 2, according to family patient did not get physical therapy even though he was in the hospital for 1 week. Prior to admission pt fell in his bathroom Blood pressure was 200s over 120. Because of concern for deconditioning, ambulatory difficulties, elevated blood pressure, lethargy wife requested that he be monitored and get physical therapy, she is requesting placement.  I see the note from case manager arranging home health physical therapy but wife is very angry and wants him to be admitted.  Clinical Impression  Pt admitted with above diagnosis. Pt currently with functional limitations due to the deficits listed below (see PT Problem List). Pt requires min assist to sit upright at EOB. Once in sitting he is steady without support. Pt demonstrates good stability with transfers. Good speed and safe sequencing. Pt is able to complete a full lap around RN station with therapist. He is able to perform horizontal head turns without lateral gait deviation. No LOB or external support required by therapist. No signs of DOE or respiratory distress. Pt able to balance with feet apart/together. Negative Romberg. Single leg balance approximately 3-4 seconds. Pt is safe to return home at discharge and is currently not appropriate for SNF placement. Wife reports that she is in agreement with patient returning home. No DME needs. He would benefit from resumption of HH PT at discharge. Pt will benefit from PT services to address deficits in strength,  balance, and mobility in order to return to full function at home.     Follow Up Recommendations Home health PT    Equipment Recommendations  None recommended by PT    Recommendations for Other Services       Precautions / Restrictions Precautions Precautions: Fall Restrictions Weight Bearing Restrictions: No      Mobility  Bed Mobility Overal bed mobility: Needs Assistance Bed Mobility: Supine to Sit     Supine to sit: Min assist     General bed mobility comments: Pt requires assist to sit upright at EOB. Once in sitting he is steady without support.  Transfers Overall transfer level: Needs assistance Equipment used: Rolling walker (2 wheeled) Transfers: Sit to/from Stand Sit to Stand: Min guard         General transfer comment: Pt demonstrates good stability with transfers. good speed and safe sequencing  Ambulation/Gait Ambulation/Gait assistance: Min guard Gait Distance (Feet): 220 Feet Assistive device: Rolling walker (2 wheeled)       General Gait Details: Pt is able to complete a full lap around RN station with therapist. He is able to perform horizontal head turns without lateral gait deviation. No LOB or external support required by therapist. No signs of DOE or respiratory distress  Stairs            Wheelchair Mobility    Modified Rankin (Stroke Patients Only)       Balance Overall balance assessment: Needs assistance Sitting-balance support: No upper extremity supported Sitting balance-Leahy Scale: Good     Standing balance support: No upper extremity supported Standing balance-Leahy Scale: Fair Standing balance comment:  Pt able to balance with feet apart/together. Negative Romberg. Single leg balance approximately 3-4 seconds.                              Pertinent Vitals/Pain Pain Assessment: Faces Faces Pain Scale: Hurts a little bit Pain Location: Low back pain Pain Descriptors / Indicators: Aching Pain  Intervention(s): Monitored during session    Home Living Family/patient expects to be discharged to:: Unsure Living Arrangements: Spouse/significant other Available Help at Discharge: Family Type of Home: House Home Access: Stairs to enter Entrance Stairs-Rails: Psychiatric nurse of Steps: 10 Home Layout: Multi-level;Able to live on main level with bedroom/bathroom Home Equipment: Gilford Rile - 2 wheels;Toilet riser      Prior Function Level of Independence: Needs assistance   Gait / Transfers Assistance Needed: Pt typically ambulates without assistive device. He has been using a rolling walker since his last discharge  ADL's / Homemaking Assistance Needed: Pt requires assist with ADLs/IADLs        Hand Dominance        Extremity/Trunk Assessment   Upper Extremity Assessment Upper Extremity Assessment: Overall WFL for tasks assessed    Lower Extremity Assessment Lower Extremity Assessment: Overall WFL for tasks assessed       Communication   Communication: No difficulties  Cognition Arousal/Alertness: Awake/alert Behavior During Therapy: WFL for tasks assessed/performed Overall Cognitive Status: History of cognitive impairments - at baseline                                 General Comments: Pt is AOx 1       General Comments      Exercises     Assessment/Plan    PT Assessment Patient needs continued PT services  PT Problem List Decreased strength;Decreased activity tolerance;Decreased balance;Decreased mobility       PT Treatment Interventions DME instruction;Gait training;Stair training;Functional mobility training;Therapeutic activities;Therapeutic exercise;Balance training;Neuromuscular re-education;Patient/family education    PT Goals (Current goals can be found in the Care Plan section)  Acute Rehab PT Goals Patient Stated Goal: Return to prior level of function PT Goal Formulation: With patient/family Time For Goal  Achievement: 12/14/17 Potential to Achieve Goals: Good    Frequency Min 2X/week   Barriers to discharge        Co-evaluation               AM-PAC PT "6 Clicks" Daily Activity  Outcome Measure Difficulty turning over in bed (including adjusting bedclothes, sheets and blankets)?: A Little Difficulty moving from lying on back to sitting on the side of the bed? : Unable Difficulty sitting down on and standing up from a chair with arms (e.g., wheelchair, bedside commode, etc,.)?: None Help needed moving to and from a bed to chair (including a wheelchair)?: None Help needed walking in hospital room?: A Little Help needed climbing 3-5 steps with a railing? : A Little 6 Click Score: 18    End of Session Equipment Utilized During Treatment: Gait belt Activity Tolerance: Patient tolerated treatment well Patient left: in chair;with call bell/phone within reach;with chair alarm set;with family/visitor present   PT Visit Diagnosis: Unsteadiness on feet (R26.81);History of falling (Z91.81);Difficulty in walking, not elsewhere classified (R26.2);Muscle weakness (generalized) (M62.81)    Time: 9024-0973 PT Time Calculation (min) (ACUTE ONLY): 31 min   Charges:   PT Evaluation $PT Eval Low Complexity: 1 Low PT Treatments $  Gait Training: 8-22 mins        Lyndel Safe Talah Cookston PT, DPT, GCS   Kerrion Kemppainen 11/30/2017, 5:29 PM

## 2017-11-30 NOTE — Procedures (Signed)
ELECTROENCEPHALOGRAM REPORT   Patient: Alex [name]       Room #: 124A-AA EEG No. ID: [id number] Age: 77 y.o.        Sex: male Referring Physician: Salary Report Date:  11/30/2017        Interpreting Physician: Alexis Goodell  History: Alex [name] is an 77 y.o. male with unwitnessed fall  Medications:  Norvasc, ASA, Colace, Proscar, Toprol, Pravachol, Exelon  Conditions of Recording:  This is a 21 channel routine scalp EEG performed with bipolar and monopolar montages arranged in accordance to the international 10/20 system of electrode placement. One channel was dedicated to EKG recording.  The patient is in the awake and drowsy states.  Description:  The waking background activity consists of a low voltage, symmetrical, fairly well organized, 8-9 Hz alpha activity, seen from the parieto-occipital and posterior temporal regions.  Low voltage fast activity, poorly organized, is seen anteriorly and is at times superimposed on more posterior regions.  A mixture of theta and alpha rhythms are seen from the central and temporal regions. The patient drowses with slowing to irregular, low voltage theta and beta activity.   Stage II sleep is not obtained. No epileptiform activity is noted.   Hyperventilation was not performed.  Intermittent photic stimulation was performed but failed to illicit any change in the tracing.    IMPRESSION: Normal electroencephalogram, awake, drowsy and with activation procedures. There are no focal lateralizing or epileptiform features.   Alexis Goodell, MD Neurology [phone] 11/30/2017, 7:11 PM

## 2017-11-30 NOTE — Plan of Care (Signed)

## 2017-12-01 LAB — RPR: RPR Ser Ql: NONREACTIVE

## 2017-12-01 MED ORDER — MELOXICAM 15 MG PO TABS: 15.0000 mg | ORAL_TABLET | Freq: Every day | ORAL | 2 refills | Status: AC

## 2017-12-01 MED ORDER — DULOXETINE HCL 30 MG PO CPEP: 30.0000 mg | ORAL_CAPSULE | Freq: Every day | ORAL | 2 refills | Status: AC

## 2017-12-01 NOTE — Discharge Summary (Signed)
[facility] [place] NAME: Alex [name]    MR#:  [id number]  DATE OF BIRTH:  [birth year]  DATE OF ADMISSION:  11/29/2017 ADMITTING PHYSICIAN: Epifanio Lesches, MD  DATE OF DISCHARGE: No discharge date for patient encounter.  PRIMARY CARE PHYSICIAN: Patient, No Pcp Per    ADMISSION DIAGNOSIS:  Generalized weakness [R53.1]  DISCHARGE DIAGNOSIS:  Active Problems:   Ambulatory dysfunction   SECONDARY DIAGNOSIS:   Past Medical History:  Diagnosis Date  . Alzheimer's dementia   . BPH (benign prostatic hyperplasia)   . CAD (coronary artery disease)   . Dementia   . Hypertension     HOSPITAL COURSE:  *Acute on chronic encephalopathy  Resolved Secondary to Alzheimer's disease  Neurology did see patient while in house, CT head did not show any acute process, ammonia level normal, RPR nonreactive, EEG was negative, physical therapy to see patient while in house-recommended home health PT status post discharge, hospital bed ordered per family request, prognosis long-term is poor due to worsening Alzheimer's disease    *Acute hypertension  Stable on current regiment   *Acute on chronic ataxia  Most likely secondary to worsening Alzheimer's disease  Physical therapy did see patient as stated above  *Recent admission for gastric pneumatosis Resolved - denies pain, abd exam benign followed by surgery  *Acute kidney injury on CKD stage III Resolved Cr at BL  *CAD without chest pain Stable on current regiment  *History of Alzheimer's dementia Plan of care per above  *BPH Continue Proscar.   DISCHARGE CONDITIONS:   stable  CONSULTS OBTAINED:  Treatment Team:  Alexis Goodell, MD  DRUG ALLERGIES:   Allergies  Allergen Reactions  . Penicillins Other (See Comments) and Itching    Unknown reaction, childhood  Has patient had a PCN reaction causing immediate rash, facial/tongue/throat swelling, SOB or  lightheadedness with hypotension: Yes Has patient had a PCN reaction causing severe rash involving mucus membranes or skin necrosis: No Has patient had a PCN reaction that required hospitalization: No Has patient had a PCN reaction occurring within the last 10 years: No If all of the above answers are "NO", then may proceed with Cephalosporin use.     DISCHARGE MEDICATIONS:   Allergies as of 12/01/2017      Reactions   Penicillins Other (See Comments), Itching   Unknown reaction, childhood  Has patient had a PCN reaction causing immediate rash, facial/tongue/throat swelling, SOB or lightheadedness with hypotension: Yes Has patient had a PCN reaction causing severe rash involving mucus membranes or skin necrosis: No Has patient had a PCN reaction that required hospitalization: No Has patient had a PCN reaction occurring within the last 10 years: No If all of the above answers are "NO", then may proceed with Cephalosporin use.      Medication List    STOP taking these medications   ibuprofen 800 MG tablet Commonly known as:  ADVIL,MOTRIN     TAKE these medications   amLODipine 5 MG tablet Commonly known as:  NORVASC Take 5 mg by mouth daily.   aspirin EC 81 MG tablet Take 81 mg by mouth daily.   cloNIDine 0.1 MG tablet Commonly known as:  CATAPRES Take 1 tablet (0.1 mg total) by mouth 2 (two) times daily as needed.   donepezil 10 MG tablet Commonly known as:  ARICEPT Take 1 tablet (10 mg total) by mouth at bedtime.   DULoxetine 30 MG capsule Commonly known as:  CYMBALTA Take  1 capsule (30 mg total) by mouth daily.   finasteride 5 MG tablet Commonly known as:  PROSCAR Take 1 tablet (5 mg total) by mouth daily.   lisinopril 40 MG tablet Commonly known as:  PRINIVIL,ZESTRIL Take 40 mg by mouth daily.   meloxicam 15 MG tablet Commonly known as:  MOBIC Take 1 tablet (15 mg total) by mouth daily.   metoprolol succinate 25 MG 24 hr tablet Commonly known as:   TOPROL-XL Take 25 mg by mouth daily. Take with or immediately following a meal.   pravastatin 80 MG tablet Commonly known as:  PRAVACHOL Take 80 mg by mouth daily.   rivastigmine 6 MG capsule Commonly known as:  EXELON Take 6 mg by mouth 2 (two) times daily.            Durable Medical Equipment  (From admission, onward)         Start     Ordered   12/01/17 0000  DME Hospital bed    Question Answer Comment  The above medical condition requires: Patient requires the ability to reposition frequently   Head must be elevated greater than: 30 degrees   Bed type Semi-electric      12/01/17 1229           DISCHARGE INSTRUCTIONS:   If you experience worsening of your admission symptoms, develop shortness of breath, life threatening emergency, suicidal or homicidal thoughts you must seek medical attention immediately by calling 911 or calling your MD immediately  if symptoms less severe.  You Must read complete instructions/literature along with all the possible adverse reactions/side effects for all the Medicines you take and that have been prescribed to you. Take any new Medicines after you have completely understood and accept all the possible adverse reactions/side effects.   Please note  You were cared for by a hospitalist during your hospital stay. If you have any questions about your discharge medications or the care you received while you were in the hospital after you are discharged, you can call the unit and asked to speak with the hospitalist on call if the hospitalist that took care of you is not available. Once you are discharged, your primary care physician will handle any further medical issues. Please note that NO REFILLS for any discharge medications will be authorized once you are discharged, as it is imperative that you return to your primary care physician (or establish a relationship with a primary care physician if you do not have one) for your aftercare needs  so that they can reassess your need for medications and monitor your lab values.    Today   CHIEF COMPLAINT:   Chief Complaint  Patient presents with  . Weakness    HISTORY OF PRESENT ILLNESS:  77 y.o. male with a known history of eczematous dementia who was discharged from hospital yesterday brought in by wife because patient had a fall at home, blood pressure also was high and lethargic this morning.  Patient never got physical therapy, patient was admitted on August 26, discharged September 2, according to family patient did not get physical therapy even though he was in the hospital for 1 week.  And last night patient fell on his blood, blood pressure was 200s over 120 last night.  Because of concern for deconditioning, ambulatory difficulties, elevated blood pressure, lethargy wife requested that he be monitored and get physical therapy, she is requesting placement.  I see the note from case manager arranging home health physical  therapy but wife is very angry and wants him to be admitted.   VITAL SIGNS:  Blood pressure 123/88, pulse 92, temperature 98.9 F (37.2 C), temperature source Oral, resp. rate 18, height 5\' 11"  (1.803 m), weight 96.7 kg, SpO2 99 %.  I/O:    Intake/Output Summary (Last 24 hours) at 12/01/2017 1229 Last data filed at 12/01/2017 1016 Gross per 24 hour  Intake 1351.3 ml  Output 100 ml  Net 1251.3 ml    PHYSICAL EXAMINATION:  GENERAL:  77 y.o.-year-old patient lying in the bed with no acute distress.  EYES: Pupils equal, round, reactive to light and accommodation. No scleral icterus. Extraocular muscles intact.  HEENT: Head atraumatic, normocephalic. Oropharynx and nasopharynx clear.  NECK:  Supple, no jugular venous distention. No thyroid enlargement, no tenderness.  LUNGS: Normal breath sounds bilaterally, no wheezing, rales,rhonchi or crepitation. No use of accessory muscles of respiration.  CARDIOVASCULAR: S1, S2 normal. No murmurs, rubs, or gallops.   ABDOMEN: Soft, non-tender, non-distended. Bowel sounds present. No organomegaly or mass.  EXTREMITIES: No pedal edema, cyanosis, or clubbing.  NEUROLOGIC: Cranial nerves II through XII are intact. Muscle strength 5/5 in all extremities. Sensation intact. Gait not checked.  PSYCHIATRIC: The patient is alert and oriented x 3.  SKIN: No obvious rash, lesion, or ulcer.   DATA REVIEW:   CBC Recent Labs  Lab 11/30/17 0408  WBC 8.1  HGB 11.2*  HCT 31.8*  PLT 170    Chemistries  Recent Labs  Lab 11/30/17 0408  NA 142  K 3.8  CL 107  CO2 28  GLUCOSE 94  BUN 17  CREATININE 1.31*  CALCIUM 8.0*    Cardiac Enzymes No results for input(s): TROPONINI in the last 168 hours.  Microbiology Results  Results for orders placed or performed during the hospital encounter of 08/15/16  Urine culture     Status: None   Collection Time: 08/15/16  7:19 PM  Result Value Ref Range Status   Specimen Description URINE, RANDOM  Final   Special Requests NONE  Final   Culture   Final    NO GROWTH Performed at [facility], [place] [address] [place], [place] [address]    Report Status 08/17/2016 FINAL  Final    RADIOLOGY:  Dg Lumbar Spine 2-3 Views  Result Date: 11/30/2017 CLINICAL DATA:  Low back pain.  No known injury. EXAM: LUMBAR SPINE - 2-3 VIEW COMPARISON:  CT chest, abdomen and pelvis 11/21/2017. FINDINGS: There is no fracture or malalignment. Loss of disc space height vacuum disc phenomenon are seen at L5-S1. Paraspinous structures are unremarkable. IMPRESSION: No acute finding. Degenerative disc disease L5-S1. Electronically Signed   By: Inge Rise M.D.   On: 11/30/2017 11:29   Dg Pelvis 1-2 Views  Result Date: 11/30/2017 CLINICAL DATA:  Pelvic pain.  Status post fall.  Initial encounter. EXAM: PELVIS - 1-2 VIEW COMPARISON:  None. FINDINGS: There is no acute bony or joint abnormality. 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 Joint spaces are otherwise preserved. No  worrisome bony lesion. IMPRESSION: No acute finding. 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 Electronically Signed   By: Inge Rise M.D.   On: 11/30/2017 11:28   Ct Head Wo Contrast  Result Date: 11/30/2017 CLINICAL DATA:  77 y/o  M; weakness and fall in bathroom today. EXAM: CT HEAD WITHOUT CONTRAST TECHNIQUE: Contiguous axial images were obtained from the base of the skull through the vertex without intravenous contrast. COMPARISON:  None. FINDINGS: Brain:  No evidence of acute infarction, hemorrhage, hydrocephalus, extra-axial collection or mass lesion/mass effect. Chronic microvascular ischemic changes and volume loss of the brain. Volume loss is asymmetrically prominent in the medial temporal lobes. Vascular: Calcific atherosclerosis of carotid siphons. No hyperdense vessel. Skull: Normal. Negative for fracture or focal lesion. Sinuses/Orbits: No acute finding. Other: Suboccipital scalp lipoma measuring 11 x 20 mm (AP by ML series 6, image 15). IMPRESSION: 1. No acute intracranial abnormality identified. 2. Mild chronic microvascular ischemic changes and volume loss of the brain for age. Volume loss is asymmetrically prominent in the medial temporal lobes. 3. 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 Electronically Signed   By: Kristine Garbe M.D.   On: 11/30/2017 16:25    EKG:   Orders placed or performed during the hospital encounter of 11/29/17  . ED EKG  . ED EKG  . EKG 12-Lead  . EKG 12-Lead      Management plans discussed with the patient, family and they are in agreement.  CODE STATUS:     Code Status Orders  (From admission, onward)         Start     Ordered   11/29/17 1336  Full code  Continuous     11/29/17 1336        Code Status History    Date Active Date Inactive Code Status Order ID Comments User Context   11/21/2017 1012 11/28/2017 1443 Full Code [id number]  Carlus Pavlov Inpatient   08/15/2016 1017 08/15/2016 1651 Full Code [id number]  Gladstone Lighter, MD Inpatient   06/27/2015 1938 06/29/2015 1414 Full Code [id number]  Sylvan Cheese, MD Inpatient      TOTAL TIME TAKING CARE OF THIS PATIENT: 45 minutes.    Avel Peace Hoy Fallert M.D on 12/01/2017 at 12:29 PM  Between 7am to 6pm - Pager - [phone]  After 6pm go to [email or web address] - password [email or web address] [facility]  Office  [phone]  CC: Primary care physician; Patient, No Pcp Per   Note: This dictation was prepared with Dragon dictation along with smaller phrase technology. Any transcriptional errors that result from this process are unintentional.

## 2017-12-01 NOTE — Care Management Obs Status (Signed)
[facility] NOTIFICATION   Patient Details  Name: Alex [name] MRN: [id number] Date of Birth: 12/19/1940   Medicare Observation Status Notification Given:  Yes    Noha Karasik A Mashawn Brazil, RN 12/01/2017, 9:50 AM

## 2017-12-01 NOTE — Care Management Note (Addendum)
Case Management Note  Patient Details  Name: Alex [name] MRN: [id number] Date of Birth: Jun 06, 1940  Subjective/Objective:   Patient to be discharged per MD order. Orders in place for home health services. Patient currently open to [facility] and family is agreeable to resume with them. Spouse concerned about obtaining a hospital bed. MD has placed DME orders for a bed. Spouse has spoke to the [place] multiple times and she was able to obtain a bed and asked that we not order one from our agencies. Referral placed with Levada Dy from [facility] who was in agreement to accept the case for resumption of RN, aide, PT and social work. Faxed over all orders and notes. Family to provide transport.  Ines Bloomer RN BSN RNCM [phone]                   Action/Plan:   Expected Discharge Date:  12/01/17               Expected Discharge Plan:  [place]  In-House Referral:     Discharge planning Services  CM Consult  Post Acute Care Choice:  Home Health, Resumption of Svcs/PTA Provider Choice offered to:  Patient  DME Arranged:    DME Agency:     HH Arranged:  RN, PT, Nurse's Aide, Social Work [employer] Agency:  Other - See comment([facility])  Status of Service:  Completed, signed off  If discussed at [employer], dates discussed:    Additional Comments:  Latanya Maudlin, RN 12/01/2017, 2:28 PM

## 2017-12-01 NOTE — Care Management (Addendum)
Patient family does not feel safe enough to discharge patient at this time. Patient is awaiting delivery of a privately paid hospital bed and due to weather concerns family reports it will not arrive until tomorrow. I have offered obtaining a bed through other means but family refuses as she "can get a free bed through the [place] program". The patient has been medically cleared for discharge but due to the hospital bed not being delivered family refuses to discharge. RNCM spoke with attending MD who confirms patient should continue to be discharged. PT saw patient again per family request and PT has no further recommendations and agree that the patient is fit for safe discharge. I notified family of this and they still prefer to stay regardless of the consequence. Spoke to Physician Advisor Dr Doy Hutching who recommended providing the patient with an [place] letter.I have spoken with the patient and sister Margaretha Sheffield about an [place] letter. I have provided a letter to the patient and his sister at the bedside. I explained to them that insurance may not further cover the hospital stay since he has been medically cleared. I included the potential estimated nightly cost as well. The patient is not currently able to sign and the sister does not want to sign until the wife is present. I have spoken with [name], the patient spouse over the phone about the ABN letter and I have left her a copy. The family is in agreement to discharge the patient once the hospital bed arrives sometime tomorrow morning. Home health has previously been set up and there are no other RNCM needs.

## 2017-12-01 NOTE — Progress Notes (Signed)
Physical Therapy Treatment Patient Details Name: Alex [name] MRN: [id number] DOB: 1940-11-10 Today's Date: 12/01/2017    History of Present Illness Alex [name]  is a 77 y.o. male with a known history of eczematous dementia who was discharged from hospital yesterday brought in by wife because patient had a fall at home, blood pressure also was high and lethargic this morning.  Patient never got physical therapy, patient was admitted on August 26, discharged September 2, according to family patient did not get physical therapy even though he was in the hospital for 1 week. Prior to admission pt fell in his bathroom Blood pressure was 200s over 120. Because of concern for deconditioning, ambulatory difficulties, elevated blood pressure, lethargy wife requested that he be monitored and get physical therapy, she is requesting placement.  I see the note from case manager arranging home health physical therapy but wife is very angry and wants him to be admitted.    PT Comments    Patient seen for follow up visit due to family request/insistance prior to discharge.  Continues to complete basic transfers and gait distances (200') with RW, cga/min assist.   Good LE strength/power and overall stability with mobility; performance impacted by cognitive deficits and patient's interest/willingness to actively engage with mobility tasks. Brother at bedside throughout session; reports patient has RW in home environment and is mobilizing at level near baseline for patient. Continue to recommend transition to home with 24/7 supervision/assist and HHPT follow up.   Follow Up Recommendations  Home health PT     Equipment Recommendations  None recommended by PT    Recommendations for Other Services       Precautions / Restrictions Precautions Precautions: Fall Restrictions Weight Bearing Restrictions: No    Mobility  Bed Mobility               General bed mobility comments: seated in recliner  beginning/end of treatment session  Transfers Overall transfer level: Needs assistance Equipment used: Rolling walker (2 wheeled) Transfers: Sit to/from Stand Sit to Stand: Min assist         General transfer comment: cuing for hand placement; min assist to initiate movement  Ambulation/Gait Ambulation/Gait assistance: Min guard Gait Distance (Feet): 220 Feet Assistive device: Rolling walker (2 wheeled)   Gait velocity: 10' walk time, 11 seconds Gait velocity interpretation: <1.31 ft/sec, indicative of household ambulator General Gait Details: reciprocal stepping pattern, forward trunk flexion with RW arms length anterior to BOS (limited receptiveness to correction).  Slow, but steady, cadence and overall gait performance; no buckling or LOB noted.   Stairs             Wheelchair Mobility    Modified Rankin (Stroke Patients Only)       Balance Overall balance assessment: Needs assistance Sitting-balance support: No upper extremity supported;Feet supported Sitting balance-Leahy Scale: Good     Standing balance support: Bilateral upper extremity supported Standing balance-Leahy Scale: Fair                              Cognition Arousal/Alertness: Awake/alert Behavior During Therapy: WFL for tasks assessed/performed Overall Cognitive Status: History of cognitive impairments - at baseline                                 General Comments: oriented to self; follows simple commands with increased time/encouragement  Exercises Other Exercises Other Exercises: Sit/stand x5 with RW, cga/min assist-emphasis on hand placement, mechanics and LE strength/power; improved carry-over of mechanics with repetition.  Encouraged for full postural extension, upward gaze during each standing trial.  Able to obtain full, neutral stance with cuing/encouragement    General Comments        Pertinent Vitals/Pain Pain Assessment: No/denies pain     Home Living                      Prior Function            PT Goals (current goals can now be found in the care plan section) Acute Rehab PT Goals Patient Stated Goal: Return to prior level of function PT Goal Formulation: With patient/family Time For Goal Achievement: 12/14/17 Potential to Achieve Goals: Good Progress towards PT goals: Progressing toward goals    Frequency    Min 2X/week      PT Plan Current plan remains appropriate    Co-evaluation              AM-PAC PT "6 Clicks" Daily Activity  Outcome Measure  Difficulty turning over in bed (including adjusting bedclothes, sheets and blankets)?: A Little Difficulty moving from lying on back to sitting on the side of the bed? : A Little Difficulty sitting down on and standing up from a chair with arms (e.g., wheelchair, bedside commode, etc,.)?: Unable Help needed moving to and from a bed to chair (including a wheelchair)?: A Little Help needed walking in hospital room?: A Little Help needed climbing 3-5 steps with a railing? : A Little 6 Click Score: 16    End of Session Equipment Utilized During Treatment: Gait belt Activity Tolerance: Patient tolerated treatment well Patient left: in chair;with call bell/phone within reach;with chair alarm set;with family/visitor present Nurse Communication: Mobility status PT Visit Diagnosis: Unsteadiness on feet (R26.81);History of falling (Z91.81);Difficulty in walking, not elsewhere classified (R26.2);Muscle weakness (generalized) (M62.81)     Time: 6010-9323 PT Time Calculation (min) (ACUTE ONLY): 23 min  Charges:  $Gait Training: 8-22 mins $Therapeutic Activity: 8-22 mins                     Ruger Saxer H. Owens Shark, PT, DPT, NCS 12/01/17, 3:39 PM [phone]

## 2017-12-01 NOTE — Clinical Social Work Note (Signed)
CSW found through chart review that patient walked over 200 feet with therapy yesterday. CSW contacted patient's wife Loi Rennaker [phone]. CSW explained that patient will not qualify for SNF placement. Wife states understanding of this and is agreeable to take patient home. CSW explained that patient could discharge today. Wife states that she was told that patient would receive a hospital bed and have things set up before returning home. CSW explained that no DME has been recommended but RN CM will follow up with her. CSW notified RN CM of above. CSW signing off. Please re consult if further needs arise.   [place], [place]

## 2017-12-02 MED ORDER — SALINE SPRAY 0.65 % NA SOLN
1.0000 | NASAL | Status: DC | PRN
  Administered 2017-12-02: 1 via NASAL
  Filled 2017-12-02: qty 44

## 2017-12-02 MED ORDER — SALINE SPRAY 0.65 % NA SOLN: 1.0000 | NASAL | 0 refills | Status: DC | PRN

## 2017-12-02 NOTE — Discharge Summary (Signed)
[facility] [place] NAME: Alex [name]    MR#:  [id number]  DATE OF BIRTH:  September 03, 1940  DATE OF ADMISSION:  11/29/2017 ADMITTING PHYSICIAN: Epifanio Lesches, MD  DATE OF DISCHARGE: No discharge date for patient encounter.  PRIMARY CARE PHYSICIAN: Patient, No Pcp Per    ADMISSION DIAGNOSIS:  Generalized weakness [R53.1]  DISCHARGE DIAGNOSIS:  Active Problems:   Ambulatory dysfunction   SECONDARY DIAGNOSIS:   Past Medical History:  Diagnosis Date  . Alzheimer's dementia   . BPH (benign prostatic hyperplasia)   . CAD (coronary artery disease)   . Dementia   . 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   DISCHARGE CONDITIONS:   stable  CONSULTS OBTAINED:  Treatment Team:  Alexis Goodell, MD  DRUG ALLERGIES:   Allergies  Allergen Reactions  . Penicillins Other (See Comments) and Itching    Unknown reaction, childhood  Has patient had a PCN reaction causing immediate rash, facial/tongue/throat swelling, SOB  or lightheadedness with hypotension: Yes Has patient had a PCN reaction causing severe rash involving mucus membranes or skin necrosis: No Has patient had a PCN reaction that required hospitalization: No Has patient had a PCN reaction occurring within the last 10 years: No If all of the above answers are "NO", then may proceed with Cephalosporin use.     DISCHARGE MEDICATIONS:   Allergies as of 12/02/2017      Reactions   Penicillins Other (See Comments), Itching   Unknown reaction, childhood  Has patient had a PCN reaction causing immediate rash, facial/tongue/throat swelling, SOB or lightheadedness with hypotension: Yes Has patient had a PCN reaction causing severe rash involving mucus membranes or skin necrosis: No Has patient had a PCN reaction that required hospitalization: No Has patient had a PCN reaction occurring within the last 10 years: No If all of the above answers are "NO", then may proceed with Cephalosporin use.      Medication List    STOP taking these medications   ibuprofen 800 MG tablet Commonly known as:  ADVIL,MOTRIN     TAKE these medications   amLODipine 5 MG tablet Commonly known as:  NORVASC Take 5 mg by mouth daily.   aspirin EC 81 MG tablet Take 81 mg by mouth daily.   cloNIDine 0.1 MG tablet Commonly known as:  CATAPRES Take 1 tablet (0.1 mg total) by mouth 2 (two) times daily as needed.   donepezil 10 MG tablet Commonly known as:  ARICEPT Take 1 tablet (10 mg total) by mouth at bedtime.   DULoxetine 30 MG capsule Commonly known as:  CYMBALTA Take 1  capsule (30 mg total) by mouth daily.   finasteride 5 MG tablet Commonly known as:  PROSCAR Take 1 tablet (5 mg total) by mouth daily.   lisinopril 40 MG tablet Commonly known as:  PRINIVIL,ZESTRIL Take 40 mg by mouth daily.   meloxicam 15 MG tablet Commonly known as:  MOBIC Take 1 tablet (15 mg total) by mouth daily.   metoprolol succinate 25 MG 24 hr tablet Commonly known as:   TOPROL-XL Take 25 mg by mouth daily. Take with or immediately following a meal.   pravastatin 80 MG tablet Commonly known as:  PRAVACHOL Take 80 mg by mouth daily.   rivastigmine 6 MG capsule Commonly known as:  EXELON Take 6 mg by mouth 2 (two) times daily.            Durable Medical Equipment  (From admission, onward)         Start     Ordered   12/01/17 0000  DME Hospital bed    Question Answer Comment  The above medical condition requires: Patient requires the ability to reposition frequently   Head must be elevated greater than: 30 degrees   Bed type Semi-electric      12/01/17 1229           DISCHARGE INSTRUCTIONS:   If you experience worsening of your admission symptoms, develop shortness of breath, life threatening emergency, suicidal or homicidal thoughts you must seek medical attention immediately by calling 911 or calling your MD immediately  if symptoms less severe.  You Must read complete instructions/literature along with all the possible adverse reactions/side effects for all the Medicines you take and that have been prescribed to you. Take any new Medicines after you have completely understood and accept all the possible adverse reactions/side effects.   Please note  You were cared for by a hospitalist during your hospital stay. If you have any questions about your discharge medications or the care you received while you were in the hospital after you are discharged, you can call the unit and asked to speak with the hospitalist on call if the hospitalist that took care of you is not available. Once you are discharged, your primary care physician will handle any further medical issues. Please note that NO REFILLS for any discharge medications will be authorized once you are discharged, as it is imperative that you return to your primary care physician (or establish a relationship with a primary care physician if you do not have one) for your aftercare needs  so that they can reassess your need for medications and monitor your lab values.    Today   CHIEF COMPLAINT:   Chief Complaint  Patient presents with  . Weakness    HISTORY OF PRESENT ILLNESS:  77 y.o.malewith a known history of eczematous dementia who was discharged from hospital yesterday brought in by wife because patient had a fall at home, blood pressure also was high and lethargic this morning. Patient never got physical therapy, patient was admitted on August 26, discharged September 2, according to family patient did not get physical therapy even though he was in the hospital for 1 week. And last night patient fell on his blood, blood pressure was 200s over 120 last night. Because of concern for deconditioning, ambulatory difficulties, elevated blood pressure, lethargy wife requested that he be monitored and get physical therapy, she is requesting placement. I see the note from case manager arranging home health physical therapy but wife isvery angry and wants  him to be admitted.    VITAL SIGNS:  Blood pressure 133/81, pulse 92, temperature 98.3 F (36.8 C), temperature source Oral, resp. rate 18, height 5\' 11"  (1.803 m), weight 97.2 kg, SpO2 99 %.  I/O:    Intake/Output Summary (Last 24 hours) at 12/02/2017 0944 Last data filed at 12/02/2017 0622 Gross per 24 hour  Intake 1737.03 ml  Output 100 ml  Net 1637.03 ml    PHYSICAL EXAMINATION:  GENERAL:  77 y.o.-year-old patient lying in the bed with no acute distress.  EYES: Pupils equal, round, reactive to light and accommodation. No scleral icterus. Extraocular muscles intact.  HEENT: Head atraumatic, normocephalic. Oropharynx and nasopharynx clear.  NECK:  Supple, no jugular venous distention. No thyroid enlargement, no tenderness.  LUNGS: Normal breath sounds bilaterally, no wheezing, rales,rhonchi or crepitation. No use of accessory muscles of respiration.  CARDIOVASCULAR: S1, S2 normal. No murmurs, rubs, or  gallops.  ABDOMEN: Soft, non-tender, non-distended. Bowel sounds present. No organomegaly or mass.  EXTREMITIES: No pedal edema, cyanosis, or clubbing.  NEUROLOGIC: Cranial nerves II through XII are intact. Muscle strength 5/5 in all extremities. Sensation intact. Gait not checked.  PSYCHIATRIC: The patient is alert and oriented x 3.  SKIN: No obvious rash, lesion, or ulcer.   DATA REVIEW:   CBC Recent Labs  Lab 11/30/17 0408  WBC 8.1  HGB 11.2*  HCT 31.8*  PLT 170    Chemistries  Recent Labs  Lab 11/30/17 0408  NA 142  K 3.8  CL 107  CO2 28  GLUCOSE 94  BUN 17  CREATININE 1.31*  CALCIUM 8.0*    Cardiac Enzymes No results for input(s): TROPONINI in the last 168 hours.  Microbiology Results  Results for orders placed or performed during the hospital encounter of 08/15/16  Urine culture     Status: None   Collection Time: 08/15/16  7:19 PM  Result Value Ref Range Status   Specimen Description URINE, RANDOM  Final   Special Requests NONE  Final   Culture   Final    NO GROWTH Performed at [facility], [place] [address] [place], [place] [address]    Report Status 08/17/2016 FINAL  Final    RADIOLOGY:  Dg Lumbar Spine 2-3 Views  Result Date: 11/30/2017 CLINICAL DATA:  Low back pain.  No known injury. EXAM: LUMBAR SPINE - 2-3 VIEW COMPARISON:  CT chest, abdomen and pelvis 11/21/2017. FINDINGS: There is no fracture or malalignment. Loss of disc space height vacuum disc phenomenon are seen at L5-S1. Paraspinous structures are unremarkable. IMPRESSION: No acute finding. Degenerative disc disease L5-S1. Electronically Signed   By: Inge Rise M.D.   On: 11/30/2017 11:29   Dg Pelvis 1-2 Views  Result Date: 11/30/2017 CLINICAL DATA:  Pelvic pain.  Status post fall.  Initial encounter. EXAM: PELVIS - 1-2 VIEW COMPARISON:  None. FINDINGS: There is no acute bony or joint abnormality. 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 Joint spaces are otherwise  preserved. No worrisome bony lesion. IMPRESSION: No acute finding. 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 Electronically Signed   By: Inge Rise M.D.   On: 11/30/2017 11:28   Ct Head Wo Contrast  Result Date: 11/30/2017 CLINICAL DATA:  77 y/o  M; weakness and fall in bathroom today. EXAM: CT HEAD WITHOUT CONTRAST TECHNIQUE: Contiguous axial images were obtained from the base of the skull through the vertex without intravenous contrast. COMPARISON:  None. FINDINGS: Brain: No evidence of acute infarction, hemorrhage, hydrocephalus,  extra-axial collection or mass lesion/mass effect. Chronic microvascular ischemic changes and volume loss of the brain. Volume loss is asymmetrically prominent in the medial temporal lobes. Vascular: Calcific atherosclerosis of carotid siphons. No hyperdense vessel. Skull: Normal. Negative for fracture or focal lesion. Sinuses/Orbits: No acute finding. Other: Suboccipital scalp lipoma measuring 11 x 20 mm (AP by ML series 6, image 15). IMPRESSION: 1. No acute intracranial abnormality identified. 2. Mild chronic microvascular ischemic changes and volume loss of the brain for age. Volume loss is asymmetrically prominent in the medial temporal lobes. 3. 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 Electronically Signed   By: Kristine Garbe M.D.   On: 11/30/2017 16:25    EKG:   Orders placed or performed during the hospital encounter of 11/29/17  . ED EKG  . ED EKG  . EKG 12-Lead  . EKG 12-Lead      Management plans discussed with the patient, family and they are in agreement.  CODE STATUS:     Code Status Orders  (From admission, onward)         Start     Ordered   11/29/17 1336  Full code  Continuous     11/29/17 1336        Code Status History    Date Active Date Inactive Code Status Order ID Comments User Context   11/21/2017 1012 11/28/2017 1443 Full Code [id number]  Carlus Pavlov Inpatient   08/15/2016 1017 08/15/2016 1651 Full Code [id number]   Gladstone Lighter, MD Inpatient   06/27/2015 1938 06/29/2015 1414 Full Code [id number]  Sylvan Cheese, MD Inpatient      TOTAL TIME TAKING CARE OF THIS PATIENT: 45 minutes.    Avel Peace Colten Desroches M.D on 12/02/2017 at 9:44 AM  Between 7am to 6pm - Pager - [phone]  After 6pm go to [email or web address] - password [email or web address] [facility]  Office  [phone]  CC: Primary care physician; Patient, No Pcp Per   Note: This dictation was prepared with Dragon dictation along with smaller phrase technology. Any transcriptional errors that result from this process are unintentional.

## 2017-12-02 NOTE — Care Management (Signed)
Multiple attempts to call [place] for [facility]. [phone]).  Mr Grobe wife states that she spoke with this nurse yesterday. This nurse was suppose to have ordered hospital bed.  Attempted to call Margarita Grizzle and [place] at [facility] several times. Shelbie Ammons RN MSN CCM Care Management [phone]

## 2017-12-02 NOTE — Care Management (Signed)
Received telephone call back from [place] for [facility]. States that Dr. Talbot Grumbling has signed order for hospital bed. This order was signed 12/01/17. This order needs to be processed by the the prosthetics department, Then sent to [facility] [employer]. States she will see how far this has gone in this process if she is able to. Received telephone call from wife. States she hasn't heard anything from nurse or company. Doesn't want to take him home, until bed has been delivered.  Discussed that she was issued an ABN yesterday and that she would be responsible for charges until husband was discharged, Will update Nurse Stanton Kidney and Dr. Jerelyn Charles. Shelbie Ammons RN MSN CCM Care Management [phone]

## 2017-12-02 NOTE — Plan of Care (Signed)

## 2017-12-02 NOTE — Care Management (Signed)
Spoke with wife, Alex [name]. States she spoke with Helene Kelp at [facility] yesterday. Indicated that [facility] would [facility] bed and have it delivered today to the home. Ms. Alex [name] was driving states she will call this case manager back with [facility]'s telephone number.  Transportation will be arranged per [place] RN MSN CCM Care Management [phone]

## 2017-12-03 LAB — GLUCOSE, CAPILLARY
Glucose-Capillary: 96 mg/dL (ref 70–99)
Glucose-Capillary: 55 mg/dL — ABNORMAL LOW (ref 70–99)
Glucose-Capillary: 88 mg/dL (ref 70–99)

## 2017-12-03 NOTE — Progress Notes (Signed)
Physical Therapy Treatment Patient Details Name: Alex [name] MRN: [id number] DOB: 11/21/1940 Today's Date: 12/03/2017    History of Present Illness Alex [name]  is a 77 y.o. male with a known history of eczematous dementia who was discharged from hospital yesterday brought in by wife because patient had a fall at home, blood pressure also was high and lethargic this morning.  Patient never got physical therapy, patient was admitted on August 26, discharged September 2, according to family patient did not get physical therapy even though he was in the hospital for 1 week. Prior to admission pt fell in his bathroom Blood pressure was 200s over 120. Because of concern for deconditioning, ambulatory difficulties, elevated blood pressure, lethargy wife requested that he be monitored and get physical therapy, she is requesting placement.  I see the note from case manager arranging home health physical therapy but wife is very angry and wants him to be admitted.    PT Comments    Pt seen this am per request of wife prior to discharge.  Pt incontinent of urine and was given new brief and care as appropriate.  He required min assist and verbal/tactile cues for bed mobility.  Once sitting he was able to sit unsupported but did require verbal cues to remain upright as he leaned backwards on the bed.  A 2nd assist was called for safety but pt was able to stand and ambulate x 1 around unit with walker and min guard +1.  He had no LOB or buckling with gait.  He did require verbal cues to walk into walker at times.  Decreased step length and height were noted.  Wife in attendance for session and had no further questions regarding mobility status regarding discharge.   Follow Up Recommendations  Home health PT     Equipment Recommendations  None recommended by PT    Recommendations for Other Services       Precautions / Restrictions Precautions Precautions: Fall Restrictions Weight Bearing  Restrictions: No    Mobility  Bed Mobility Overal bed mobility: Needs Assistance Bed Mobility: Supine to Sit     Supine to sit: Min assist     General bed mobility comments: verbal cues for hand placements and sequencing  Transfers Overall transfer level: Needs assistance Equipment used: Rolling walker (2 wheeled) Transfers: Sit to/from Stand Sit to Stand: Min assist         General transfer comment: +2 for safety but no extra physical assist needed.  Ambulation/Gait Ambulation/Gait assistance: Min guard Gait Distance (Feet): 220 Feet   Gait Pattern/deviations: Step-through pattern;Decreased step length - right;Decreased step length - left;Trunk flexed Gait velocity: decreased   General Gait Details: 2 nd assist used for safety but pt had no LOB or buckling.  Reported some fatigue but he was able to complete full lap with no buckling or LOB   Stairs             Wheelchair Mobility    Modified Rankin (Stroke Patients Only)       Balance Overall balance assessment: Needs assistance Sitting-balance support: Feet supported;Single extremity supported Sitting balance-Leahy Scale: Fair Sitting balance - Comments: Pt leaning backwards and required verbal cues to sit upright at times   Standing balance support: Bilateral upper extremity supported Standing balance-Leahy Scale: Fair Standing balance comment: able stand and walk without LOB or buckling  Cognition Arousal/Alertness: Awake/alert Behavior During Therapy: WFL for tasks assessed/performed Overall Cognitive Status: History of cognitive impairments - at baseline                                 General Comments: oriented to self; follows simple commands with increased time/encouragement      Exercises Other Exercises Other Exercises: Pt incontinent of urine.  Changed and care provided as appropriate.    General Comments        Pertinent  Vitals/Pain Pain Assessment: Faces Faces Pain Scale: Hurts a little bit Pain Location: Low back and hip pain from immobility Pain Descriptors / Indicators: Aching Pain Intervention(s): Repositioned;Monitored during session;Limited activity within patient's tolerance    Home Living                      Prior Function            PT Goals (current goals can now be found in the care plan section) Progress towards PT goals: Progressing toward goals    Frequency    Min 2X/week      PT Plan Current plan remains appropriate    Co-evaluation              AM-PAC PT "6 Clicks" Daily Activity  Outcome Measure  Difficulty turning over in bed (including adjusting bedclothes, sheets and blankets)?: Unable Difficulty moving from lying on back to sitting on the side of the bed? : Unable Difficulty sitting down on and standing up from a chair with arms (e.g., wheelchair, bedside commode, etc,.)?: A Little Help needed moving to and from a bed to chair (including a wheelchair)?: A Little Help needed walking in hospital room?: A Little Help needed climbing 3-5 steps with a railing? : A Lot 6 Click Score: 13    End of Session Equipment Utilized During Treatment: Gait belt Activity Tolerance: Patient tolerated treatment well Patient left: in chair;with chair alarm set;with call bell/phone within reach;with family/visitor present Nurse Communication: Mobility status;Other (comment)       Time: 1034-1100 PT Time Calculation (min) (ACUTE ONLY): 26 min  Charges:  $Gait Training: 8-22 mins $Therapeutic Activity: 8-22 mins                     Chesley Noon, PTA 12/03/17, 11:08 AM

## 2017-12-03 NOTE — Progress Notes (Addendum)
Pt refused VS, and morning medications. Will continue to monitor.

## 2017-12-03 NOTE — Progress Notes (Signed)
[place] at [place] NAME: Alex [name]    MR#:  [id number]  DATE OF BIRTH:  [birth year]  SUBJECTIVE:  CHIEF COMPLAINT:   Chief Complaint  Patient presents with  . Weakness  confused at baseline.  Awaiting for hospital bed delivered to home per his wife. REVIEW OF SYSTEMS:     Review of Systems  Unable to perform ROS: Dementia    DRUG ALLERGIES:   Allergies  Allergen Reactions  . Penicillins Other (See Comments) and Itching    Unknown reaction, childhood  Has patient had a PCN reaction causing immediate rash, facial/tongue/throat swelling, SOB or lightheadedness with hypotension: Yes Has patient had a PCN reaction causing severe rash involving mucus membranes or skin necrosis: No Has patient had a PCN reaction that required hospitalization: No Has patient had a PCN reaction occurring within the last 10 years: No If all of the above answers are "NO", then may proceed with Cephalosporin use.     VITALS:  Blood pressure 128/85, pulse 69, temperature 98.5 F (36.9 C), temperature source Oral, resp. rate 20, height 5\' 11"  (1.803 m), weight 99.2 kg, SpO2 98 %.  PHYSICAL EXAMINATION:  GENERAL:  77 y.o.-year-old patient lying in the bed with no acute distress.  EYES: Pupils equal, round, reactive to light and accommodation. No scleral icterus. Extraocular muscles intact.  HEENT: Head atraumatic, normocephalic. Oropharynx and nasopharynx clear.  NECK:  Supple, no jugular venous distention. No thyroid enlargement, no tenderness.  LUNGS: Normal breath sounds bilaterally, no wheezing, rales,rhonchi or crepitation. No use of accessory muscles of respiration.  CARDIOVASCULAR: S1, S2 normal. No murmurs, rubs, or gallops.  ABDOMEN: Soft, nontender, nondistended. Bowel sounds present. No organomegaly or mass.  EXTREMITIES: No pedal edema, cyanosis, or clubbing.  NEUROLOGIC: Unable to exam. PSYCHIATRIC: The patient is confused and demented.Marland Kitchen   SKIN: No obvious rash, lesion, or ulcer.   Physical Exam LABORATORY PANEL:   CBC Recent Labs  Lab 11/30/17 0408  WBC 8.1  HGB 11.2*  HCT 31.8*  PLT 170   ------------------------------------------------------------------------------------------------------------------  Chemistries  Recent Labs  Lab 11/30/17 0408  NA 142  K 3.8  CL 107  CO2 28  GLUCOSE 94  BUN 17  CREATININE 1.31*  CALCIUM 8.0*   ------------------------------------------------------------------------------------------------------------------  Cardiac Enzymes No results for input(s): TROPONINI in the last 168 hours. ------------------------------------------------------------------------------------------------------------------  RADIOLOGY:  No results found.  ASSESSMENT AND PLAN:  *Acute on chronic encephalopathy  Secondary to Alzheimer's disease  Neurologydid see patient while in house, CT head did not show any acute process, ammonia level normal, RPR nonreactive, EEG was negative, physical therapy to see patient while in house-recommended home health PT status post discharge, hospital bed ordered per family request, prognosis long-term is poor due to worsening Alzheimer's disease.  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  *CAD without chest pain Stable on current regiment  *History of Alzheimer's dementia Plan of care per above  *BPH Continue Proscar.  Disposition pending clinical course  All the records are reviewed and case discussed with Care Management/Social Workerr. Management plans discussed with the patient's wife and they are in agreement.  CODE  STATUS:full TOTAL TIME TAKING CARE OF THIS PATIENT: 25 minutes.  POSSIBLE D/C IN 1 DAYS, DEPENDING ON CLINICAL CONDITION.   Demetrios Loll M.D on 12/03/2017   Between 7am to 6pm - Pager - [phone]  After 6pm go to [email or web address] - password [email or web address] [facility]  Office  [phone]  CC: Primary care physician; Patient, No Pcp Per  Note: This dictation was prepared with Dragon dictation along with smaller phrase technology. Any transcriptional errors that result from this process are unintentional.

## 2017-12-03 NOTE — Progress Notes (Signed)
Pt D/C discontinued today. Pt bed not delivered. Family wants PT to walk pt everyday. Will continue to monitor.

## 2017-12-04 LAB — GLUCOSE, CAPILLARY: Glucose-Capillary: 100 mg/dL — ABNORMAL HIGH (ref 70–99)

## 2017-12-04 MED ORDER — POLYVINYL ALCOHOL 1.4 % OP SOLN
1.0000 [drp] | OPHTHALMIC | Status: DC | PRN
  Administered 2017-12-04: 22:00:00 1 [drp] via OPHTHALMIC
  Filled 2017-12-04: qty 15

## 2017-12-04 NOTE — Progress Notes (Signed)
[place] [facility] [place] NAME: Alex [name]    MR#:  [id number]  DATE OF BIRTH:  [birth year]  SUBJECTIVE:  CHIEF COMPLAINT:   Chief Complaint  Patient presents with  . Weakness  confused at baseline.  Awaiting for hospital bed delivered to home per his wife. REVIEW OF SYSTEMS:     Review of Systems  Unable to perform ROS: Dementia    DRUG ALLERGIES:   Allergies  Allergen Reactions  . Penicillins Other (See Comments) and Itching    Unknown reaction, childhood  Has patient had a PCN reaction causing immediate rash, facial/tongue/throat swelling, SOB or lightheadedness with hypotension: Yes Has patient had a PCN reaction causing severe rash involving mucus membranes or skin necrosis: [name] Has patient had a PCN reaction that required hospitalization: [name] Has patient had a PCN reaction occurring within the last 10 years: [name] If all of the above answers are "[name]", then may proceed with Cephalosporin use.     VITALS:  Blood pressure (!) 143/74, pulse 62, temperature 97.8 F (36.6 C), temperature source Oral, resp. rate 16, height 5\' 11"  (1.803 m), weight 96.9 kg, SpO2 96 %.  PHYSICAL EXAMINATION:  GENERAL:  77 y.o.-year-old patient lying in the bed with [name] acute distress.  EYES: Pupils equal, round, reactive to light and accommodation. [name] scleral icterus. Extraocular muscles intact.  HEENT: Head atraumatic, normocephalic. Oropharynx and nasopharynx clear.  NECK:  Supple, [name] jugular venous distention. [name] thyroid enlargement, [name] tenderness.  LUNGS: Normal breath sounds bilaterally, [name] wheezing, rales,rhonchi or crepitation. [name] use of accessory muscles of respiration.  CARDIOVASCULAR: S1, S2 normal. [name] murmurs, rubs, or gallops.  ABDOMEN: Soft, nontender, nondistended. Bowel sounds present. [name] organomegaly or mass.  EXTREMITIES: [name] pedal edema, cyanosis, or clubbing.  NEUROLOGIC: Unable to exam. PSYCHIATRIC: The patient is confused and  demented.Marland Kitchen  SKIN: [name] obvious rash, lesion, or ulcer.   Physical Exam LABORATORY PANEL:   CBC Recent Labs  Lab 11/30/17 0408  WBC 8.1  HGB 11.2*  HCT 31.8*  PLT 170   ------------------------------------------------------------------------------------------------------------------  Chemistries  Recent Labs  Lab 11/30/17 0408  NA 142  K 3.8  CL 107  CO2 28  GLUCOSE 94  BUN 17  CREATININE 1.31*  CALCIUM 8.0*   ------------------------------------------------------------------------------------------------------------------  Cardiac Enzymes [name] results for input(s): TROPONINI in the last 168 hours. ------------------------------------------------------------------------------------------------------------------  RADIOLOGY:  [name] results found.  ASSESSMENT AND PLAN:  *Acute on chronic encephalopathy  Secondary to Alzheimer's disease  Neurologydid see patient while in house, CT head did not show any acute process, ammonia level normal, RPR nonreactive, EEG was negative, physical therapy to see patient while in house-recommended home health PT status post discharge, hospital bed ordered per family request, prognosis long-term is poor due to worsening Alzheimer's disease.  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  *CAD without chest pain Stable on current regiment  *History of Alzheimer's dementia Plan of care per above  *BPH Continue Proscar.  All the records are reviewed and case discussed with Care Management/Social Workerr. Management plans discussed with the patient's wife and they are in agreement.  CODE STATUS:full TOTAL TIME TAKING CARE  OF THIS PATIENT: 25 minutes.  POSSIBLE D/C IN  1 DAYS, DEPENDING ON CLINICAL CONDITION.   Demetrios Loll M.D on 12/04/2017   Between 7am to 6pm - Pager - [phone]  After 6pm go to [email or web address] - password [email or web address] [facility]  Office  [phone]  CC: Primary care physician; Patient, [name] Pcp Per  Note: This dictation was prepared with Dragon dictation along with smaller phrase technology. Any transcriptional errors that result from this process are unintentional.

## 2017-12-05 LAB — GLUCOSE, CAPILLARY: Glucose-Capillary: 93 mg/dL (ref 70–99)

## 2017-12-05 NOTE — Discharge Summary (Signed)
[facility] at [place] NAME: Alex [name]    MR#:  [id number]  DATE OF BIRTH:  December 08, 1940  DATE OF ADMISSION:  11/29/2017 ADMITTING PHYSICIAN: Epifanio Lesches, MD  DATE OF DISCHARGE: 12/05/2017 11:30 AM  PRIMARY CARE PHYSICIAN: Patient, No Pcp Per    ADMISSION DIAGNOSIS:  Generalized weakness [R53.1]  DISCHARGE DIAGNOSIS:  Active Problems:   Ambulatory dysfunction   SECONDARY DIAGNOSIS:   Past Medical History:  Diagnosis Date  . Alzheimer's dementia   . BPH (benign prostatic hyperplasia)   . CAD (coronary artery disease)   . Dementia   . Hypertension     HOSPITAL COURSE:  *Acute on chronic encephalopathy  Secondary to Alzheimer's disease  Neurologydid see patient while in house, CT head did not show any acute process, ammonia level normal, RPR nonreactive, EEG was negative, physical therapy to see patient while in house-recommended home health PT status post discharge, hospital bed ordered per family request, prognosis long-term is poor due to worsening Alzheimer's disease.  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  *CAD without chest pain Stable on current regiment  *History of Alzheimer's dementia Plan of care per above  *BPH Continue Proscar. DISCHARGE CONDITIONS:   Stable, discharged to home with home health and PT.  CONSULTS OBTAINED:  Treatment Team:  Alexis Goodell, MD  DRUG ALLERGIES:   Allergies  Allergen Reactions  . Penicillins Other (See Comments) and Itching    Unknown reaction, childhood  Has patient had a PCN reaction causing immediate rash, facial/tongue/throat  swelling, SOB or lightheadedness with hypotension: Yes Has patient had a PCN reaction causing severe rash involving mucus membranes or skin necrosis: No Has patient had a PCN reaction that required hospitalization: No Has patient had a PCN reaction occurring within the last 10 years: No If all of the above answers are "NO", then may proceed with Cephalosporin use.     DISCHARGE MEDICATIONS:   Allergies as of 12/05/2017      Reactions   Penicillins Other (See Comments), Itching   Unknown reaction, childhood  Has patient had a PCN reaction causing immediate rash, facial/tongue/throat swelling, SOB or lightheadedness with hypotension: Yes Has patient had a PCN reaction causing severe rash involving mucus membranes or skin necrosis: No Has patient had a PCN reaction that required hospitalization: No Has patient had a PCN reaction occurring within the last 10 years: No If all of the above answers are "NO", then may proceed with Cephalosporin use.      Medication List    STOP taking these medications   ibuprofen 800 MG tablet Commonly known as:  ADVIL,MOTRIN     TAKE these medications   amLODipine 5 MG tablet Commonly known as:  NORVASC Take 5 mg by mouth daily.   aspirin EC 81 MG tablet Take 81 mg by mouth daily.   cloNIDine 0.1 MG tablet Commonly known as:  CATAPRES Take 1 tablet (0.1 mg total) by mouth 2 (two) times daily as needed.   donepezil 10 MG tablet Commonly known as:  ARICEPT Take 1 tablet (10 mg total) by mouth at bedtime.   DULoxetine 30 MG capsule Commonly known as:  CYMBALTA Take 1 capsule (30 mg  total) by mouth daily.   finasteride 5 MG tablet Commonly known as:  PROSCAR Take 1 tablet (5 mg total) by mouth daily.   lisinopril 40 MG tablet Commonly known as:  PRINIVIL,ZESTRIL Take 40 mg by mouth daily.   meloxicam 15 MG tablet Commonly known as:  MOBIC Take 1 tablet (15 mg total) by mouth daily.   metoprolol succinate 25 MG 24 hr tablet Commonly  known as:  TOPROL-XL Take 25 mg by mouth daily. Take with or immediately following a meal.   pravastatin 80 MG tablet Commonly known as:  PRAVACHOL Take 80 mg by mouth daily.   rivastigmine 6 MG capsule Commonly known as:  EXELON Take 6 mg by mouth 2 (two) times daily.   sodium chloride 0.65 % Soln nasal spray Commonly known as:  OCEAN Place 1 spray into both nostrils as needed for congestion.            Durable Medical Equipment  (From admission, onward)         Start     Ordered   12/01/17 0000  DME Hospital bed    Question Answer Comment  The above medical condition requires: Patient requires the ability to reposition frequently   Head must be elevated greater than: 30 degrees   Bed type Semi-electric      12/01/17 1229           DISCHARGE INSTRUCTIONS:   If you experience worsening of your admission symptoms, develop shortness of breath, life threatening emergency, suicidal or homicidal thoughts you must seek medical attention immediately by calling 911 or calling your MD immediately  if symptoms less severe.  You Must read complete instructions/literature along with all the possible adverse reactions/side effects for all the Medicines you take and that have been prescribed to you. Take any new Medicines after you have completely understood and accept all the possible adverse reactions/side effects.   Please note  You were cared for by a hospitalist during your hospital stay. If you have any questions about your discharge medications or the care you received while you were in the hospital after you are discharged, you can call the unit and asked to speak with the hospitalist on call if the hospitalist that took care of you is not available. Once you are discharged, your primary care physician will handle any further medical issues. Please note that NO REFILLS for any discharge medications will be authorized once you are discharged, as it is imperative that you  return to your primary care physician (or establish a relationship with a primary care physician if you do not have one) for your aftercare needs so that they can reassess your need for medications and monitor your lab values.    Today   CHIEF COMPLAINT:   Chief Complaint  Patient presents with  . Weakness    HISTORY OF PRESENT ILLNESS:  77 y.o.malewith a known history of eczematous dementia who was discharged from hospital yesterday brought in by wife because patient had a fall at home, blood pressure also was high and lethargic this morning. Patient never got physical therapy, patient was admitted on August 26, discharged September 2, according to family patient did not get physical therapy even though he was in the hospital for 1 week. And last night patient fell on his blood, blood pressure was 200s over 120 last night. Because of concern for deconditioning, ambulatory difficulties, elevated blood pressure, lethargy wife requested that he be monitored and get physical therapy, she  is requesting placement. I see the note from case manager arranging home health physical therapy but wife isvery angry and wants him to be admitted.    VITAL SIGNS:  Blood pressure 139/79, pulse 73, temperature 98.6 F (37 C), temperature source Oral, resp. rate 18, height 5\' 11"  (1.803 m), weight 96.5 kg, SpO2 95 %.  I/O:    Intake/Output Summary (Last 24 hours) at 12/05/2017 1816 Last data filed at 12/05/2017 1006 Gross per 24 hour  Intake 240 ml  Output -  Net 240 ml    PHYSICAL EXAMINATION:  GENERAL:  77 y.o.-year-old patient lying in the bed with no acute distress.  EYES: Pupils equal, round, reactive to light and accommodation. No scleral icterus. Extraocular muscles intact.  HEENT: Head atraumatic, normocephalic. Oropharynx and nasopharynx clear.  NECK:  Supple, no jugular venous distention. No thyroid enlargement, no tenderness.  LUNGS: Normal breath sounds bilaterally, no wheezing,  rales,rhonchi or crepitation. No use of accessory muscles of respiration.  CARDIOVASCULAR: S1, S2 normal. No murmurs, rubs, or gallops.  ABDOMEN: Soft, non-tender, non-distended. Bowel sounds present. No organomegaly or mass.  EXTREMITIES: No pedal edema, cyanosis, or clubbing.  NEUROLOGIC: Cranial nerves II through XII are intact. Muscle strength 3-4/5 in all extremities. Sensation intact. Gait not checked.  PSYCHIATRIC: The patient is alert and oriented x 1-2.  SKIN: No obvious rash, lesion, or ulcer.   DATA REVIEW:   CBC Recent Labs  Lab 11/30/17 0408  WBC 8.1  HGB 11.2*  HCT 31.8*  PLT 170    Chemistries  Recent Labs  Lab 11/30/17 0408  NA 142  K 3.8  CL 107  CO2 28  GLUCOSE 94  BUN 17  CREATININE 1.31*  CALCIUM 8.0*    Cardiac Enzymes No results for input(s): TROPONINI in the last 168 hours.  Microbiology Results  Results for orders placed or performed during the hospital encounter of 08/15/16  Urine culture     Status: None   Collection Time: 08/15/16  7:19 PM  Result Value Ref Range Status   Specimen Description URINE, RANDOM  Final   Special Requests NONE  Final   Culture   Final    NO GROWTH Performed at [facility], [place] [address] [place], [place] [address]    Report Status 08/17/2016 FINAL  Final    RADIOLOGY:  No results found.  EKG:   Orders placed or performed during the hospital encounter of 11/29/17  . ED EKG  . ED EKG  . EKG 12-Lead  . EKG 12-Lead      Management plans discussed with the patient, his wife and they are in agreement.  CODE STATUS:     Code Status Orders  (From admission, onward)         Start     Ordered   11/29/17 1336  Full code  Continuous     11/29/17 1336        Code Status History    Date Active Date Inactive Code Status Order ID Comments User Context   11/21/2017 1012 11/28/2017 1443 Full Code [id number]  Carlus Pavlov Inpatient   08/15/2016 1017 08/15/2016 1651 Full Code [id number]   Gladstone Lighter, MD Inpatient   06/27/2015 1938 06/29/2015 1414 Full Code [id number]  Sylvan Cheese, MD Inpatient      TOTAL TIME TAKING CARE OF THIS PATIENT: 32 minutes.    Demetrios Loll M.D on 12/05/2017 at 6:16 PM  Between 7am to 6pm - Pager - [phone]  After 6pm go to [email or web address] - password [email or web address] [facility]  Office  [phone]  CC: Primary care physician; Patient, No Pcp Per   Note: This dictation was prepared with Dragon dictation along with smaller phrase technology. Any transcriptional errors that result from this process are unintentional.

## 2017-12-05 NOTE — Progress Notes (Signed)
Patient discharged home per MD order. All discharge instructions given to wife and all questions answered. Prescriptions given to wife.

## 2017-12-05 NOTE — Care Management (Signed)
Left message with [facility] Nurse for [facility]. [phone]) for return call regarding delivery of the hospital bed. Spoke with patients wife,  Alex [name]. She is taking patient  home today by car and to the [place] for a doctors appointment. She has not received the bed but is working with Helene Kelp at the [place]. She states she had no needs from [facility].

## 2017-12-06 NOTE — Care Management (Signed)
Late Entry: Home Health Orders faxed to [place].

## 2017-12-19 DIAGNOSIS — K55059 Acute (reversible) ischemia of intestine, part and extent unspecified: Secondary | ICD-10-CM

## 2018-04-26 ENCOUNTER — Other Ambulatory Visit: Payer: Self-pay

## 2018-04-26 ENCOUNTER — Emergency Department
Admission: EM | Admit: 2018-04-26 | Discharge: 2018-04-26 | Disposition: A | Discharge status: Home / Self Care | Payer: Medicare HMO | Attending: Emergency Medicine | Admitting: Emergency Medicine

## 2018-04-26 ENCOUNTER — Encounter: Payer: Self-pay | Admitting: Emergency Medicine

## 2018-04-26 DIAGNOSIS — I2510 Atherosclerotic heart disease of native coronary artery without angina pectoris: Secondary | ICD-10-CM | POA: Diagnosis not present

## 2018-04-26 DIAGNOSIS — Z79899 Other long term (current) drug therapy: Secondary | ICD-10-CM | POA: Insufficient documentation

## 2018-04-26 DIAGNOSIS — R4182 Altered mental status, unspecified: Secondary | ICD-10-CM | POA: Diagnosis present

## 2018-04-26 DIAGNOSIS — Z7982 Long term (current) use of aspirin: Secondary | ICD-10-CM | POA: Insufficient documentation

## 2018-04-26 DIAGNOSIS — Z7902 Long term (current) use of antithrombotics/antiplatelets: Secondary | ICD-10-CM | POA: Diagnosis not present

## 2018-04-26 DIAGNOSIS — F0391 Unspecified dementia with behavioral disturbance: Secondary | ICD-10-CM | POA: Diagnosis not present

## 2018-04-26 DIAGNOSIS — I10 Essential (primary) hypertension: Secondary | ICD-10-CM | POA: Insufficient documentation

## 2018-04-26 DIAGNOSIS — G309 Alzheimer's disease, unspecified: Secondary | ICD-10-CM | POA: Diagnosis not present

## 2018-04-26 DIAGNOSIS — Z955 Presence of coronary angioplasty implant and graft: Secondary | ICD-10-CM | POA: Diagnosis not present

## 2018-04-26 LAB — COMPREHENSIVE METABOLIC PANEL
ALT: 21 U/L (ref 0–44)
AST: 30 U/L (ref 15–41)
Albumin: 4 g/dL (ref 3.5–5.0)
Alkaline Phosphatase: 70 U/L (ref 38–126)
Anion gap: 7 (ref 5–15)
BUN: 21 mg/dL (ref 8–23)
CO2: 29 mmol/L (ref 22–32)
Calcium: 9 mg/dL (ref 8.9–10.3)
Chloride: 102 mmol/L (ref 98–111)
Creatinine, Ser: 1.44 mg/dL — ABNORMAL HIGH (ref 0.61–1.24)
GFR calc Af Amer: 54 mL/min — ABNORMAL LOW (ref >60)
GFR calc non Af Amer: 46 mL/min — ABNORMAL LOW (ref >60)
Glucose, Bld: 90 mg/dL (ref 70–99)
Potassium: 3.6 mmol/L (ref 3.5–5.1)
Sodium: 138 mmol/L (ref 135–145)
Total Bilirubin: 0.6 mg/dL (ref 0.3–1.2)
Total Protein: 7.3 g/dL (ref 6.5–8.1)

## 2018-04-26 LAB — URINALYSIS, COMPLETE (UACMP) WITH MICROSCOPIC
Bacteria, UA: NONE SEEN
Bilirubin Urine: NEGATIVE
Glucose, UA: NEGATIVE mg/dL
Ketones, ur: NEGATIVE mg/dL
Leukocytes, UA: NEGATIVE
Nitrite: NEGATIVE
Protein, ur: NEGATIVE mg/dL
Specific Gravity, Urine: 1.013 (ref 1.005–1.030)
pH: 5 (ref 5.0–8.0)

## 2018-04-26 LAB — PROTIME-INR
INR: 0.95
Prothrombin Time: 12.6 seconds (ref 11.4–15.2)

## 2018-04-26 LAB — CBC
HCT: 39.8 % (ref 39.0–52.0)
Hemoglobin: 12.8 g/dL — ABNORMAL LOW (ref 13.0–17.0)
MCH: 30.8 pg (ref 26.0–34.0)
MCHC: 32.2 g/dL (ref 30.0–36.0)
MCV: 95.7 fL (ref 80.0–100.0)
Platelets: 148 10*3/uL — ABNORMAL LOW (ref 150–400)
RBC: 4.16 MIL/uL — ABNORMAL LOW (ref 4.22–5.81)
RDW: 12.9 % (ref 11.5–15.5)
WBC: 7.2 10*3/uL (ref 4.0–10.5)
nRBC: 0 % (ref 0.0–0.2)

## 2018-04-26 MED ORDER — TRAZODONE HCL 50 MG PO TABS: 25.0000 mg | ORAL_TABLET | Freq: Every day | ORAL | 0 refills | Status: DC

## 2018-04-26 NOTE — ED Triage Notes (Signed)
Per spouse pt was referred from [place] due to HTN readings being shown through facility. Pt hx of dementia but spouse states increased confusion with decreased sleep. Pt A&Ox2

## 2018-04-26 NOTE — ED Notes (Signed)
Pt difficult stick, this RN attempted without success. Lab called to draw blood at this time

## 2018-04-26 NOTE — ED Provider Notes (Signed)
[facility] Emergency Department Provider Note  ____________________________________________   I have reviewed the triage vital signs and the nursing notes. Where available I have reviewed prior notes and, if possible and indicated, outside hospital notes.    HISTORY  Chief Complaint Hypertension and Altered Mental Status    HPI Alex [name] is a 78 y.o. male  With a history of Alzheimer's dementia, hypertension, presents today because his family is concerned he is not sleeping and after he does not sleep is a little harder to manage at home.  No closed head injury, no vomiting no fever no chills no chest pain or shortness of breath he states he feels "fine".  Patient does have a history of poorly controlled hypertension, the family states that the blood pressure medications were actually reduced by their PCP a few days ago and her Lasix was reduced a few days ago as well.  Patient has no headache or chest pain he states he feels fine.  Family very concerned that he did not want to get dressed this morning.  He states that he just sometimes does not feel like going to bed, family have been trying to get their PCP to give him something to help him sleep and have not and had any success with that.  Time, he appears to be at his baseline and patient himself states he feels fine.      Past Medical History:  Diagnosis Date  . Alzheimer's dementia ([place])   . BPH (benign prostatic hyperplasia)   . CAD (coronary artery disease)   . Dementia ([place])   . Hypertension     Patient Active Problem List   Diagnosis Date Noted  . 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 Ambulatory dysfunction 11/29/2017  . Pneumatosis intestinalis   . Perforated gastric ulcer ([place]) 11/21/2017  . Chest pain 06/27/2015  . Uncontrolled hypertension 06/27/2015  . Bradycardia 06/27/2015  . Pleural thickening 06/27/2015    Past Surgical History:  Procedure Laterality Date  . CORONARY  ANGIOPLASTY WITH STENT PLACEMENT    . HERNIA REPAIR      Prior to Admission medications   Medication Sig Start Date End Date Taking? Authorizing Provider  amLODipine (NORVASC) 5 MG tablet Take 5 mg by mouth daily.    [provider]  aspirin EC 81 MG tablet Take 81 mg by mouth daily.    [provider]  cloNIDine (CATAPRES) 0.1 MG tablet Take 1 tablet (0.1 mg total) by mouth 2 (two) times daily as needed. Patient not taking: Reported on 11/21/2017 04/28/17   Paulette Blanch, MD  donepezil (ARICEPT) 10 MG tablet Take 1 tablet (10 mg total) by mouth at bedtime. Patient not taking: Reported on 08/15/2016 06/29/15   Loletha Grayer, MD  DULoxetine (CYMBALTA) 30 MG capsule Take 1 capsule (30 mg total) by mouth daily. 12/01/17 12/01/18  Salary, Avel Peace, MD  finasteride (PROSCAR) 5 MG tablet Take 1 tablet (5 mg total) by mouth daily. 06/29/15   Loletha Grayer, MD  lisinopril (PRINIVIL,ZESTRIL) 40 MG tablet Take 40 mg by mouth daily.    [provider]  meloxicam (MOBIC) 15 MG tablet Take 1 tablet (15 mg total) by mouth daily. 12/01/17 12/01/18  Salary, Holly Bodily D, MD  metoprolol succinate (TOPROL-XL) 25 MG 24 hr tablet Take 25 mg by mouth daily. Take with or immediately following a meal.     [provider]  pravastatin (PRAVACHOL) 80 MG tablet Take 80 mg by mouth daily.  [provider]  rivastigmine (EXELON) 6 MG capsule Take 6 mg by mouth 2 (two) times daily.    [provider]  sodium chloride (OCEAN) 0.65 % SOLN nasal spray Place 1 spray into both nostrils as needed for congestion. 12/02/17   Salary, Avel Peace, MD    Allergies Penicillins  Family History  Problem Relation Age of Onset  . Hypertension Mother     Social History Social History   Tobacco Use  . Smoking status: Never Smoker  . Smokeless tobacco: Never Used  Substance Use Topics  . Alcohol use: Yes    Comment: rarely  . Drug use: No    Review of Systems Constitutional: No  fever/chills Eyes: No visual changes. ENT: No sore throat. No stiff neck no neck pain Cardiovascular: Denies chest pain. Respiratory: Denies shortness of breath. Gastrointestinal:   no vomiting.  No diarrhea.  No constipation. Genitourinary: Negative for dysuria. Musculoskeletal: Negative lower extremity swelling Skin: Negative for rash. Neurological: Negative for severe headaches, focal weakness or numbness.   ____________________________________________   PHYSICAL EXAM:  VITAL SIGNS: ED Triage Vitals [04/26/18 1126]  Enc Vitals Group     BP (!) 195/104     Pulse Rate 68     Resp 16     Temp 97.7 F (36.5 C)     Temp Source Oral     SpO2 98 %     Weight      Height      Head Circumference      Peak Flow      Pain Score 0     Pain Loc      Pain Edu?      Excl. in [place]?     Constitutional: Alert and oriented. Well appearing and in no acute distress. Eyes: Conjunctivae are normal Head: Atraumatic HEENT: No congestion/rhinnorhea. Mucous membranes are moist.  Oropharynx non-erythematous Neck:   Nontender with no meningismus, no masses, no stridor Cardiovascular: Normal rate, regular rhythm. Grossly normal heart sounds.  Good peripheral circulation. Respiratory: Normal respiratory effort.  No retractions. Lungs CTAB. Abdominal: Soft and nontender. No distention. No guarding no rebound Back:  There is no focal tenderness or step off.  there is no midline tenderness there are no lesions noted. there is no CVA tenderness Musculoskeletal: No lower extremity tenderness, no upper extremity tenderness. No joint effusions, no DVT signs strong distal pulses no edema Neurologic:  Normal speech and language. No gross focal neurologic deficits are appreciated.  Skin:  Skin is warm, dry and intact. No rash noted. Psychiatric: Mood and affect are normal. Speech and behavior are normal.  ____________________________________________   LABS (all labs ordered are listed, but only  abnormal results are displayed)  Labs Reviewed  COMPREHENSIVE METABOLIC PANEL - Abnormal; Notable for the following components:      Result Value   Creatinine, Ser 1.44 (*)    GFR calc non Af Amer 46 (*)    GFR calc Af Amer 54 (*)    All other components within normal limits  CBC - Abnormal; Notable for the following components:   RBC 4.16 (*)    Hemoglobin 12.8 (*)    Platelets 148 (*)    All other components within normal limits  URINALYSIS, COMPLETE (UACMP) WITH MICROSCOPIC - Abnormal; Notable for the following components:   Color, Urine YELLOW (*)    APPearance CLEAR (*)    Hgb urine dipstick SMALL (*)    All other components within normal limits  PROTIME-INR  Pertinent labs  results that were available during my care of the patient were reviewed by me and considered in my medical decision making (see chart for details). ____________________________________________  EKG  I personally interpreted any EKGs ordered by me or triage Sinus rhythm rate 64 bpm, normal axis, nonspecific ST changes no acute ischemia ____________________________________________  RADIOLOGY  Pertinent labs & imaging results that were available during my care of the patient were reviewed by me and considered in my medical decision making (see chart for details). If possible, patient and/or family made aware of any abnormal findings.  No results found. ____________________________________________    PROCEDURES  Procedure(s) performed: None  Procedures  Critical Care performed: None  ____________________________________________   INITIAL IMPRESSION / ASSESSMENT AND PLAN / ED COURSE  Pertinent labs & imaging results that were available during my care of the patient were reviewed by me and considered in my medical decision making (see chart for details).  Patient here because he is having trouble sleeping, and when he has trouble sleeping he becomes less tractable in the context of his  dementia.  At this time he is in no acute distress.  Patient is eager to go home blood pressures in the 170s which for him is actually good or close to it.  He has no evidence of endorgan damage and I think he requires imaging.  Family would like something to help him sleep at night as he does tend to stay awake for hours on and in a chair.  I can give him a small trial of trazodone, but I have explained to them that this is only for a day or 2 and I cannot write for a long period of time from the ER for this medication.  Otherwise, I do not see any indication to admit the patient or transfer him to the [place] and he is in no acute distress patient and family are strongly trying to go home.  Precautions were given and understood.    ____________________________________________   FINAL CLINICAL IMPRESSION(S) / ED DIAGNOSES  Final diagnoses:  None      This chart was dictated using voice recognition software.  Despite best efforts to proofread,  errors can occur which can change meaning.      Schuyler Amor, MD 04/26/18 [phone]

## 2019-05-07 ENCOUNTER — Ambulatory Visit: Payer: Medicare Other | Attending: Internal Medicine

## 2019-05-07 DIAGNOSIS — Z23 Encounter for immunization: Secondary | ICD-10-CM

## 2019-05-07 NOTE — Progress Notes (Signed)
   Covid-19 Vaccination Clinic  Name:  Alex [name]    MRN: [id number] DOB: 1940/06/04  05/07/2019  Alex [name] was observed post Covid-19 immunization for 15 minutes without incidence. He was provided with Vaccine Information Sheet and instruction to access the V-Safe system.   Alex [name] was instructed to call 911 with any severe reactions post vaccine: Marland Kitchen Difficulty breathing  . Swelling of your face and throat  . A fast heartbeat  . A bad rash all over your body  . Dizziness and weakness    Immunizations Administered    Name Date Dose VIS Date Route   Moderna COVID-19 Vaccine 05/07/2019  1:55 PM 0.5 mL 02/27/2019 Intramuscular   Manufacturer: Moderna   Lot: [id number]   [place]:[id number]

## 2019-06-06 ENCOUNTER — Ambulatory Visit: Payer: Medicare Other | Attending: Internal Medicine

## 2019-06-06 DIAGNOSIS — Z23 Encounter for immunization: Secondary | ICD-10-CM | POA: Insufficient documentation

## 2019-06-06 NOTE — Progress Notes (Signed)
   Covid-19 Vaccination Clinic  Name:  Alex [name]    MRN: [id number] DOB: 21-Oct-1940  06/06/2019  [name] was observed post Covid-19 immunization for 15 minutes without incident. He was provided with Vaccine Information Sheet and instruction to access the V-Safe system.   [name] was instructed to call 911 with any severe reactions post vaccine: Marland Kitchen Difficulty breathing  . Swelling of face and throat  . A fast heartbeat  . A bad rash all over body  . Dizziness and weakness   Immunizations Administered    Name Date Dose VIS Date Route   Moderna COVID-19 Vaccine 06/06/2019  1:29 PM 0.5 mL 02/27/2019 Intramuscular   Manufacturer: Moderna   Lot: [id number]   [place]PO:[id number]

## 2019-07-21 ENCOUNTER — Emergency Department
Admission: EM | Admit: 2019-07-21 | Discharge: 2019-07-21 | Disposition: A | Discharge status: Home / Self Care | Payer: Medicare HMO | Attending: Emergency Medicine | Admitting: Emergency Medicine

## 2019-07-21 ENCOUNTER — Emergency Department: Payer: Medicare HMO

## 2019-07-21 ENCOUNTER — Encounter: Payer: Self-pay | Admitting: Emergency Medicine

## 2019-07-21 ENCOUNTER — Other Ambulatory Visit: Payer: Self-pay

## 2019-07-21 DIAGNOSIS — I2510 Atherosclerotic heart disease of native coronary artery without angina pectoris: Secondary | ICD-10-CM | POA: Insufficient documentation

## 2019-07-21 DIAGNOSIS — R001 Bradycardia, unspecified: Secondary | ICD-10-CM | POA: Insufficient documentation

## 2019-07-21 DIAGNOSIS — F0390 Unspecified dementia without behavioral disturbance: Secondary | ICD-10-CM | POA: Insufficient documentation

## 2019-07-21 DIAGNOSIS — I10 Essential (primary) hypertension: Secondary | ICD-10-CM | POA: Insufficient documentation

## 2019-07-21 DIAGNOSIS — Z8546 Personal history of malignant neoplasm of prostate: Secondary | ICD-10-CM | POA: Insufficient documentation

## 2019-07-21 HISTORY — DX: Malignant (primary) neoplasm, unspecified: C80.1

## 2019-07-21 LAB — CBC
HCT: 37.8 % — ABNORMAL LOW (ref 39.0–52.0)
Hemoglobin: 12.6 g/dL — ABNORMAL LOW (ref 13.0–17.0)
MCH: 30.9 pg (ref 26.0–34.0)
MCHC: 33.3 g/dL (ref 30.0–36.0)
MCV: 92.6 fL (ref 80.0–100.0)
Platelets: 150 10*3/uL (ref 150–400)
RBC: 4.08 MIL/uL — ABNORMAL LOW (ref 4.22–5.81)
RDW: 13 % (ref 11.5–15.5)
WBC: 7.6 10*3/uL (ref 4.0–10.5)
nRBC: 0 % (ref 0.0–0.2)

## 2019-07-21 LAB — BASIC METABOLIC PANEL
Anion gap: 11 (ref 5–15)
BUN: 26 mg/dL — ABNORMAL HIGH (ref 8–23)
CO2: 27 mmol/L (ref 22–32)
Calcium: 9 mg/dL (ref 8.9–10.3)
Chloride: 103 mmol/L (ref 98–111)
Creatinine, Ser: 1.54 mg/dL — ABNORMAL HIGH (ref 0.61–1.24)
GFR calc Af Amer: 49 mL/min — ABNORMAL LOW (ref >60)
GFR calc non Af Amer: 42 mL/min — ABNORMAL LOW (ref >60)
Glucose, Bld: 92 mg/dL (ref 70–99)
Potassium: 4.2 mmol/L (ref 3.5–5.1)
Sodium: 141 mmol/L (ref 135–145)

## 2019-07-21 LAB — TROPONIN I (HIGH SENSITIVITY): Troponin I (High Sensitivity): 4 ng/L (ref <18)

## 2019-07-21 IMAGING — CR DG CHEST 2V
1 series · 2 of 2 positions shown · non-contrast
Comparison: [DATE]

CLINICAL DATA: Bradycardia, dizziness and weakness. History of
cardiac disease.

EXAM:
CHEST - 2 VIEW

[Series 1: dg chest 2 view · 0.14mm/px · 2 of 2 slices shown]
[im 1/2]
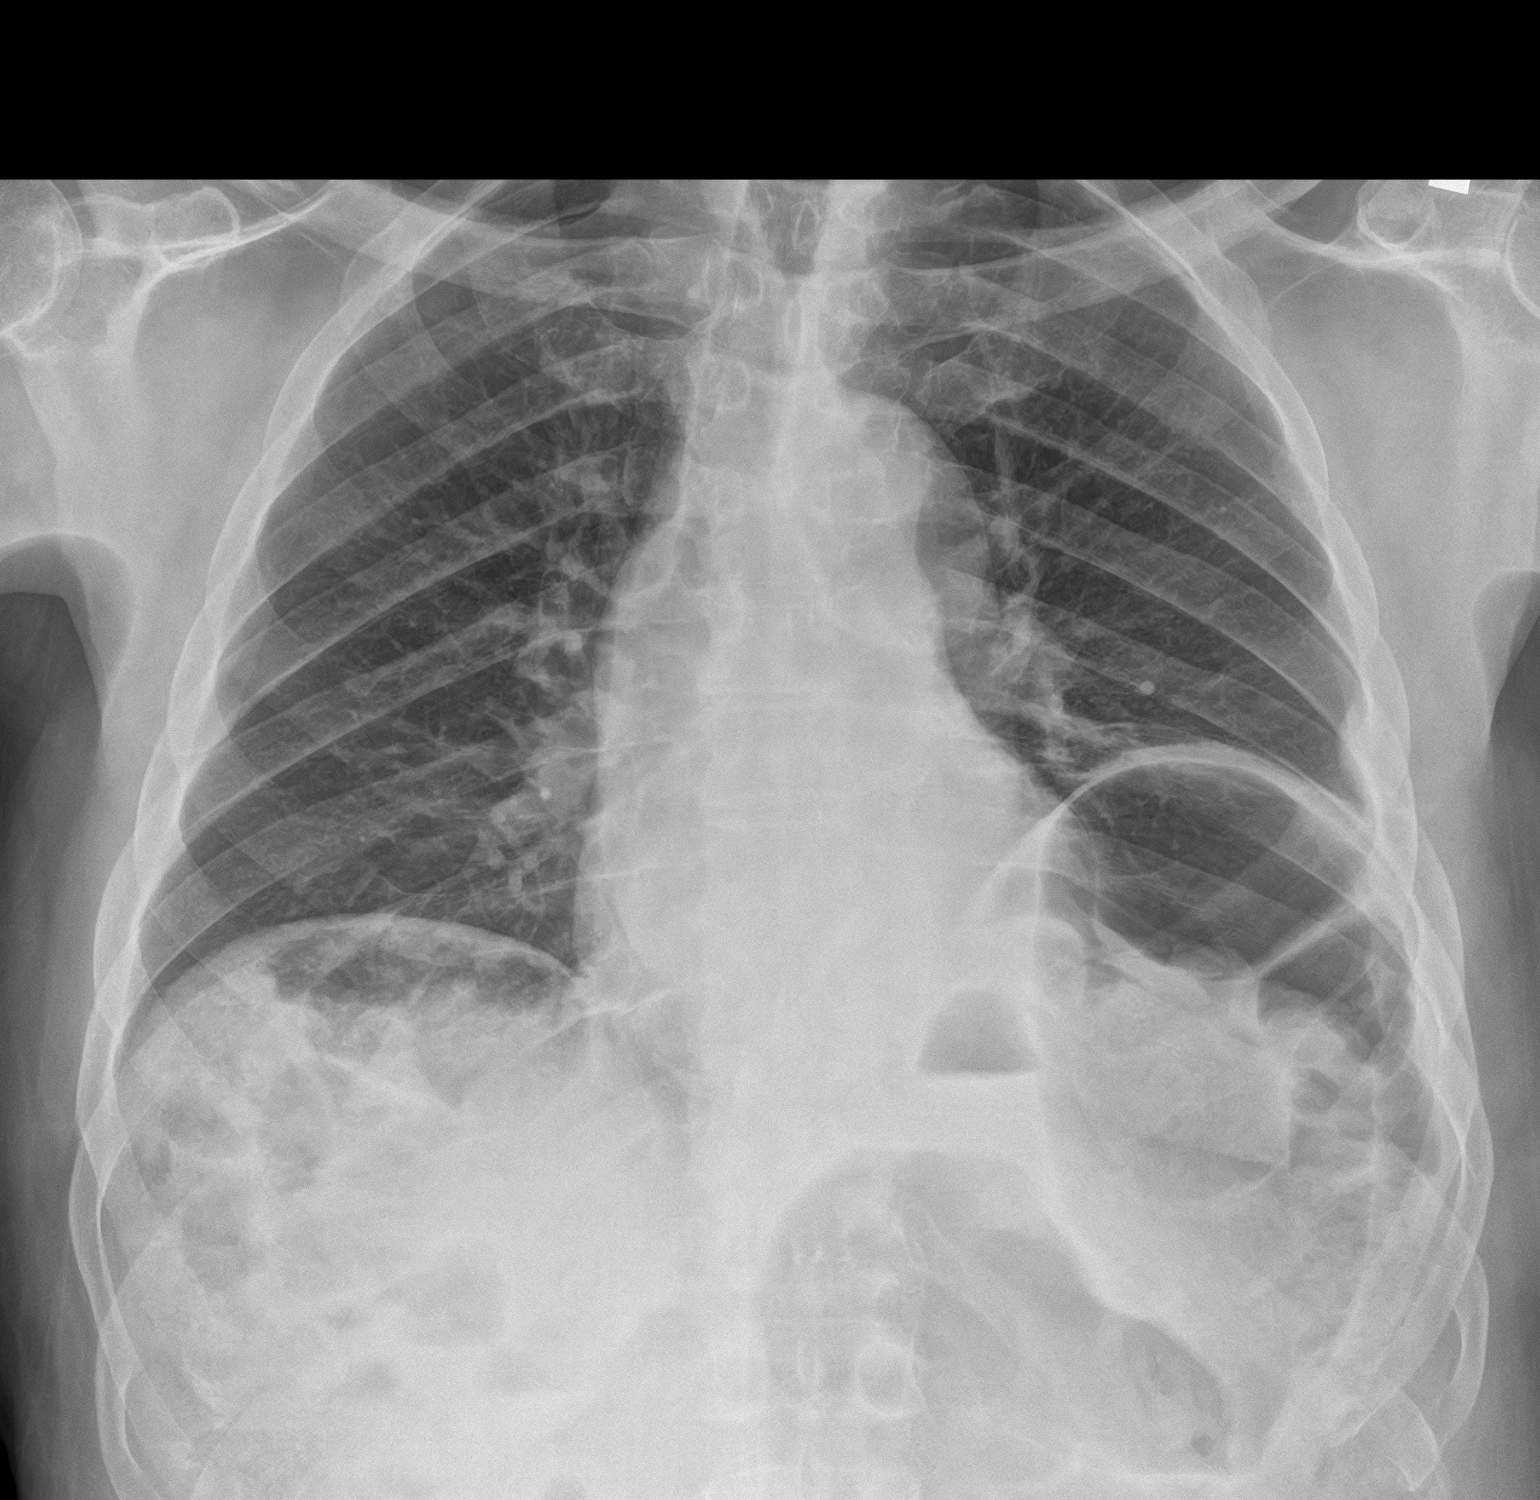
[im 2/2]
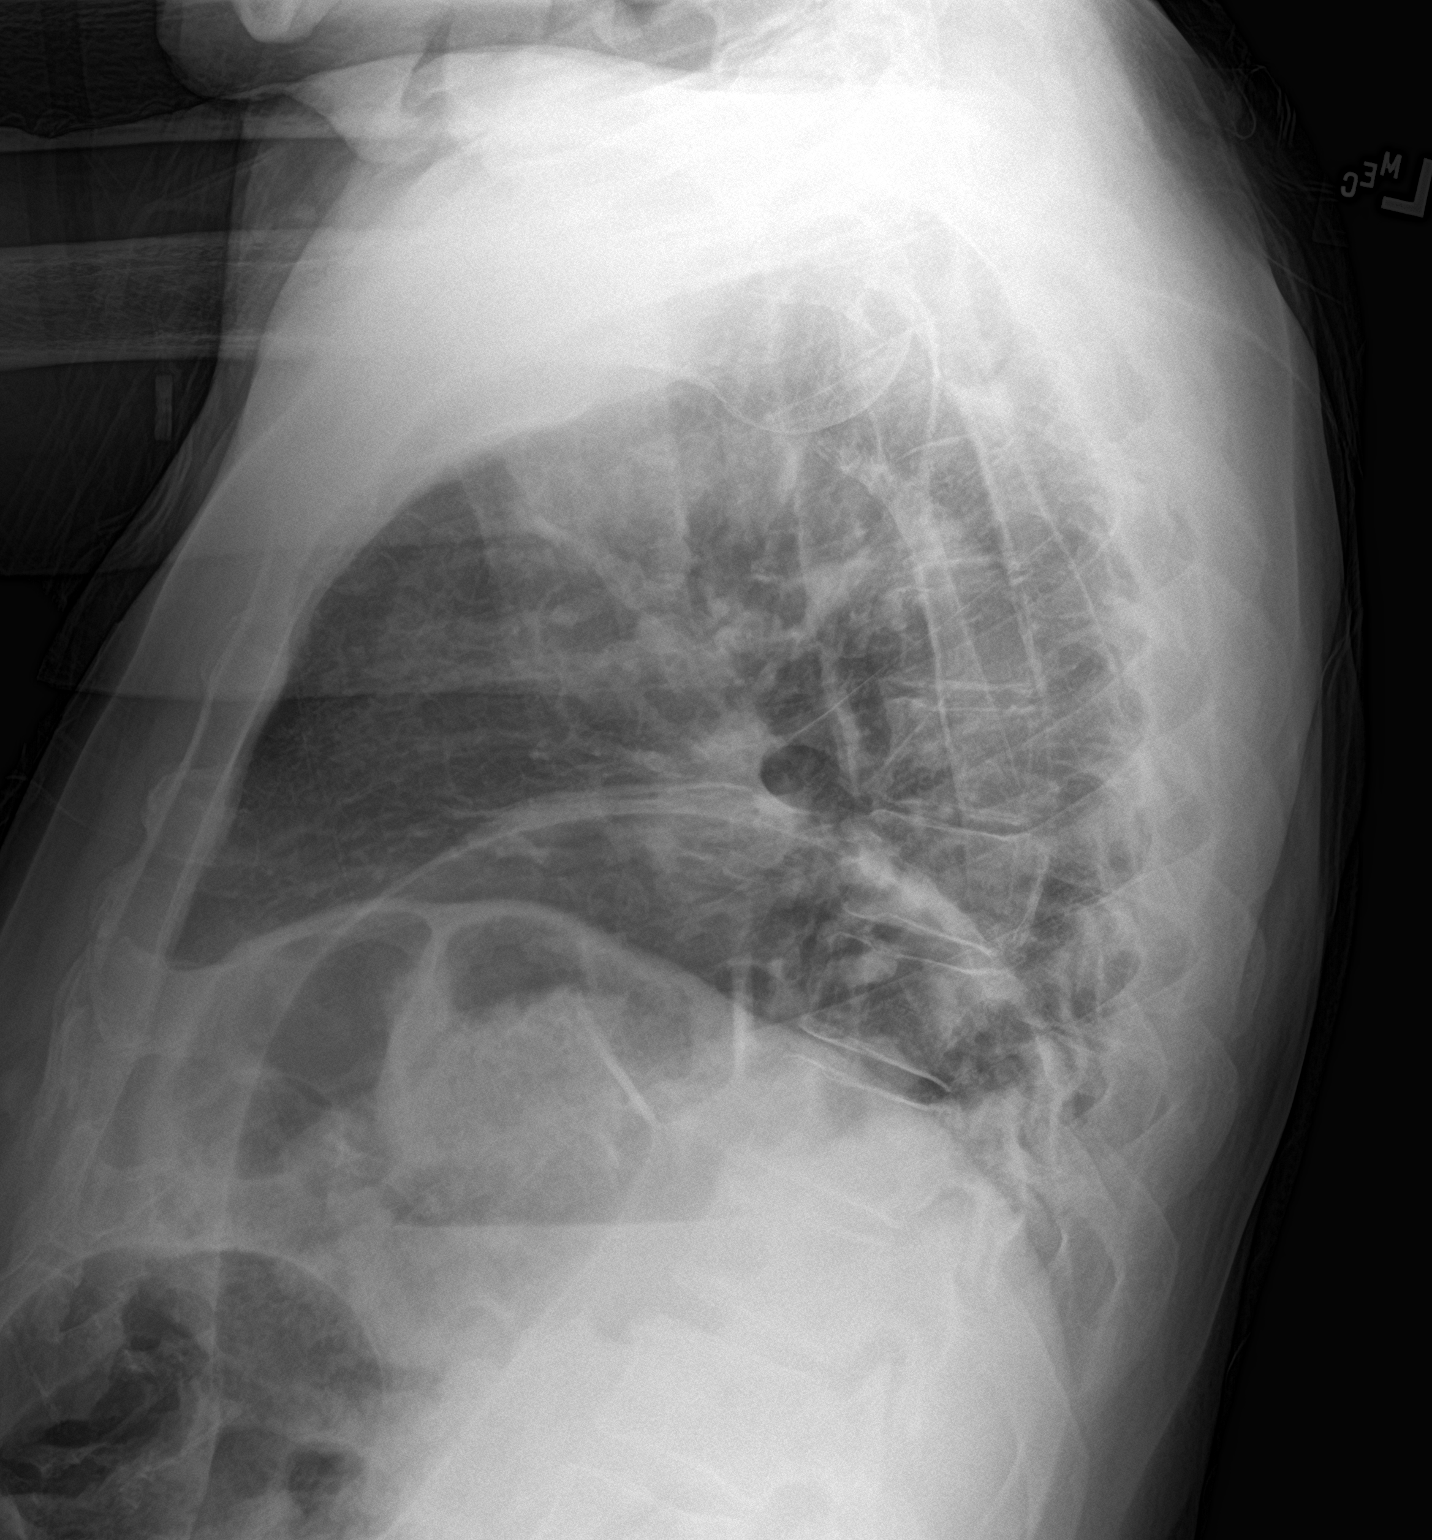

[2 of 2 positions shown; findings below may reference images not displayed]

FINDINGS: Cardiomediastinal contours and pulmonary vasculature is
unremarkable.

Lungs are clear. No sign of pleural effusion.

Visualized skeletal structures are unremarkable. There is colonic
and small bowel distension in the abdomen not well evaluated though
there was some similar distension of the colon in [E3].
IMPRESSION: 1. No active cardiopulmonary disease.
2. Increased colonic and small bowel distension in the abdomen
though there was some similar distension of the colon in [E3].
Correlate with abdominal pain with abdominal imaging as warranted.

## 2019-07-21 MED ORDER — SODIUM CHLORIDE 0.9% FLUSH: 3.0000 mL | Freq: Once | INTRAVENOUS | Status: DC

## 2019-07-21 NOTE — ED Triage Notes (Signed)
Pt to ED via POV with his wife. Pt wife states that when checking his blood pressure at home his pulse has been low. Pt wife states that his pulse was 46 earlier. They called the nurses line and they told him that if he pulse did not get above 60 to bring him in to be checked. Pt wife states that pt has dementia so she unsure if he is having any dizziness or weakness because he does not c/o anything. Pt has cardiac hx with stent placement x 3. Pt is in NAD.

## 2019-07-21 NOTE — ED Notes (Signed)
Pt and Pt's spouse verbalized understanding of discharge instructions. NAD at this time.

## 2019-07-21 NOTE — ED Provider Notes (Signed)
[facility] Emergency Department Provider Note       Time seen: ----------------------------------------- 5:09 PM on 07/21/2019 -----------------------------------------   I have reviewed the triage vital signs and the nursing notes.  HISTORY   Chief Complaint Bradycardia   HPI Alex [name] is a 79 y.o. male with a history of dementia, coronary artery disease, hypertension, prostate cancer who presents to the ED for low heart rate.  Wife states that when he is checking his blood pressure at home his pulse has been low.  The nurse line was called by the wife and the patient was encouraged to come here for evaluation.  Patient denies complaints.  Past Medical History:  Diagnosis Date  . Alzheimer's dementia ([place])   . BPH (benign prostatic hyperplasia)   . CAD (coronary artery disease)   . Cancer [facility])    Prostate Cancer per wife  . Dementia ([place])   . Hypertension     Patient Active Problem List   Diagnosis Date Noted  . Acute mesenteric ischemia ([employer])   . Ambulatory dysfunction 11/29/2017  . Pneumatosis intestinalis   . Perforated gastric ulcer ([place]) 11/21/2017  . Chest pain 06/27/2015  . Uncontrolled hypertension 06/27/2015  . Bradycardia 06/27/2015  . Pleural thickening 06/27/2015    Past Surgical History:  Procedure Laterality Date  . CORONARY ANGIOPLASTY WITH STENT PLACEMENT    . HERNIA REPAIR      Allergies Penicillins  Social History Social History   Tobacco Use  . Smoking status: Never Smoker  . Smokeless tobacco: Never Used  Substance Use Topics  . Alcohol use: Yes    Comment: rarely  . Drug use: No    Review of Systems Constitutional: Negative for fever. Cardiovascular: Negative for chest pain. Respiratory: Negative for shortness of breath. Gastrointestinal: Negative for abdominal pain, vomiting and diarrhea. Musculoskeletal: Negative for back pain. Skin: Negative for rash. Neurological: Negative for headaches,  focal weakness or numbness.  All systems negative/normal/unremarkable except as stated in the HPI  ____________________________________________   PHYSICAL EXAM:  VITAL SIGNS: ED Triage Vitals  Enc Vitals Group     BP 07/21/19 1635 106/86     Pulse Rate 07/21/19 1635 (!) 50     Resp 07/21/19 1635 16     Temp 07/21/19 1635 98.4 F (36.9 C)     Temp Source 07/21/19 1635 Oral     SpO2 07/21/19 1635 100 %     Weight --      Height --      Head Circumference --      Peak Flow --      Pain Score 07/21/19 1632 0     Pain Loc --      Pain Edu? --      Excl. in [place]? --     Constitutional: Alert and oriented. Well appearing and in no distress. Eyes: Conjunctivae are normal. Normal extraocular movements. Cardiovascular: Slow rate, regular rhythm. No murmurs, rubs, or gallops. Respiratory: Normal respiratory effort without tachypnea nor retractions. Breath sounds are clear and equal bilaterally. No wheezes/rales/rhonchi. Gastrointestinal: Soft and nontender. Normal bowel sounds Musculoskeletal: Nontender with normal range of motion in extremities. No lower extremity tenderness nor edema. Neurologic:  Normal speech and language. No gross focal neurologic deficits are appreciated.  Skin:  Skin is warm, dry and intact. No rash noted. Psychiatric: Mood and affect are normal. Speech and behavior are normal.  ____________________________________________  EKG: Interpreted by me.  Sinus bradycardia with rate of 50 bpm, normal axis, normal  QT  ____________________________________________  ED COURSE:  As part of my medical decision making, I reviewed the following data within the [place] History obtained from family if available, nursing notes, old chart and ekg, as well as notes from prior ED visits. Patient presented for bradycardia, we will assess with labs and imaging as indicated at this time.   Procedures  Alex [name] was evaluated in Emergency Department on  07/21/2019 for the symptoms described in the history of present illness. He was evaluated in the context of the global COVID-19 pandemic, which necessitated consideration that the patient might be at risk for infection with the SARS-CoV-2 virus that causes COVID-19. Institutional protocols and algorithms that pertain to the evaluation of patients at risk for COVID-19 are in a state of rapid change based on information released by regulatory bodies including the CDC and federal and state organizations. These policies and algorithms were followed during the patient's care in the ED.  ____________________________________________   LABS (pertinent positives/negatives)  Labs Reviewed  BASIC METABOLIC PANEL - Abnormal; Notable for the following components:      Result Value   BUN 26 (*)    Creatinine, Ser 1.54 (*)    GFR calc non Af Amer 42 (*)    GFR calc Af Amer 49 (*)    All other components within normal limits  CBC - Abnormal; Notable for the following components:   RBC 4.08 (*)    Hemoglobin 12.6 (*)    HCT 37.8 (*)    All other components within normal limits  TROPONIN I (HIGH SENSITIVITY)    RADIOLOGY Images were viewed by me  Chest x-ray IMPRESSION:  1. No active cardiopulmonary disease.  2. Increased colonic and small bowel distension in the abdomen  though there was some similar distension of the colon in 2019.  Correlate with abdominal pain with abdominal imaging as warranted.  ____________________________________________   DIFFERENTIAL DIAGNOSIS   Medication side effect, sick sinus syndrome, arrhythmia, dehydration, electrolyte abnormality  FINAL ASSESSMENT AND PLAN  Bradycardia   Plan: The patient had presented for bradycardia. Patient's labs did not reveal any acute process. Patient's imaging did reveal colonic and small bowel distention but he has no abdominal pain and this is likely chronic. We will hold his metoprolol, otherwise he is cleared for outpatient  follow-up.   Alex Aly, MD    Note: This note was generated in part or whole with voice recognition software. Voice recognition is usually quite accurate but there are transcription errors that can and very often do occur. I apologize for any typographical errors that were not detected and corrected.     Alex Newport, MD 07/21/19 [phone]

## 2019-10-04 ENCOUNTER — Emergency Department: Payer: Medicare HMO

## 2019-10-04 ENCOUNTER — Encounter: Payer: Self-pay | Admitting: *Deleted

## 2019-10-04 ENCOUNTER — Other Ambulatory Visit: Payer: Self-pay

## 2019-10-04 DIAGNOSIS — N433 Hydrocele, unspecified: Secondary | ICD-10-CM | POA: Insufficient documentation

## 2019-10-04 DIAGNOSIS — Z8546 Personal history of malignant neoplasm of prostate: Secondary | ICD-10-CM | POA: Diagnosis not present

## 2019-10-04 DIAGNOSIS — I10 Essential (primary) hypertension: Secondary | ICD-10-CM | POA: Insufficient documentation

## 2019-10-04 DIAGNOSIS — Z7982 Long term (current) use of aspirin: Secondary | ICD-10-CM | POA: Diagnosis not present

## 2019-10-04 DIAGNOSIS — I2510 Atherosclerotic heart disease of native coronary artery without angina pectoris: Secondary | ICD-10-CM | POA: Insufficient documentation

## 2019-10-04 DIAGNOSIS — Z79899 Other long term (current) drug therapy: Secondary | ICD-10-CM | POA: Diagnosis not present

## 2019-10-04 DIAGNOSIS — N50811 Right testicular pain: Secondary | ICD-10-CM | POA: Diagnosis present

## 2019-10-04 DIAGNOSIS — F0390 Unspecified dementia without behavioral disturbance: Secondary | ICD-10-CM | POA: Diagnosis not present

## 2019-10-04 LAB — CBC WITH DIFFERENTIAL/PLATELET
Abs Immature Granulocytes: 0.01 10*3/uL (ref 0.00–0.07)
Basophils Absolute: 0.1 10*3/uL (ref 0.0–0.1)
Basophils Relative: 1 %
Eosinophils Absolute: 0.4 10*3/uL (ref 0.0–0.5)
Eosinophils Relative: 6 %
HCT: 41.1 % (ref 39.0–52.0)
Hemoglobin: 13.6 g/dL (ref 13.0–17.0)
Immature Granulocytes: 0 %
Lymphocytes Relative: 23 %
Lymphs Abs: 1.5 10*3/uL (ref 0.7–4.0)
MCH: 30.7 pg (ref 26.0–34.0)
MCHC: 33.1 g/dL (ref 30.0–36.0)
MCV: 92.8 fL (ref 80.0–100.0)
Monocytes Absolute: 0.6 10*3/uL (ref 0.1–1.0)
Monocytes Relative: 10 %
Neutro Abs: 4 10*3/uL (ref 1.7–7.7)
Neutrophils Relative %: 60 %
Platelets: 143 10*3/uL — ABNORMAL LOW (ref 150–400)
RBC: 4.43 MIL/uL (ref 4.22–5.81)
RDW: 12.5 % (ref 11.5–15.5)
WBC: 6.7 10*3/uL (ref 4.0–10.5)
nRBC: 0 % (ref 0.0–0.2)

## 2019-10-04 LAB — URINALYSIS, COMPLETE (UACMP) WITH MICROSCOPIC
Bilirubin Urine: NEGATIVE
Glucose, UA: NEGATIVE mg/dL
Hgb urine dipstick: NEGATIVE
Ketones, ur: NEGATIVE mg/dL
Leukocytes,Ua: NEGATIVE
Nitrite: NEGATIVE
Protein, ur: NEGATIVE mg/dL
Specific Gravity, Urine: 1.025 (ref 1.005–1.030)
pH: 5 (ref 5.0–8.0)

## 2019-10-04 LAB — COMPREHENSIVE METABOLIC PANEL
ALT: 12 U/L (ref 0–44)
AST: 23 U/L (ref 15–41)
Albumin: 4.2 g/dL (ref 3.5–5.0)
Alkaline Phosphatase: 99 U/L (ref 38–126)
Anion gap: 10 (ref 5–15)
BUN: 26 mg/dL — ABNORMAL HIGH (ref 8–23)
CO2: 26 mmol/L (ref 22–32)
Calcium: 9 mg/dL (ref 8.9–10.3)
Chloride: 103 mmol/L (ref 98–111)
Creatinine, Ser: 1.83 mg/dL — ABNORMAL HIGH (ref 0.61–1.24)
GFR calc Af Amer: 40 mL/min — ABNORMAL LOW (ref >60)
GFR calc non Af Amer: 34 mL/min — ABNORMAL LOW (ref >60)
Glucose, Bld: 111 mg/dL — ABNORMAL HIGH (ref 70–99)
Potassium: 4.4 mmol/L (ref 3.5–5.1)
Sodium: 139 mmol/L (ref 135–145)
Total Bilirubin: 0.6 mg/dL (ref 0.3–1.2)
Total Protein: 7.6 g/dL (ref 6.5–8.1)

## 2019-10-04 IMAGING — US US SCROTUM W/ DOPPLER COMPLETE
1 series · 14 of 25 positions shown · non-contrast
Comparison: CT abdomen pelvis dated [DATE].

CLINICAL DATA: Scrotal pain and swelling.

EXAM:
SCROTAL ULTRASOUND
DOPPLER ULTRASOUND OF THE TESTICLES
TECHNIQUE: Complete ultrasound examination of the testicles, epididymis, and
other scrotal structures was performed. Color and spectral Doppler
ultrasound were also utilized to evaluate blood flow to the
testicles.

[Series 1: us scrotum · 14 of 34 slices shown]
[im 1/34]
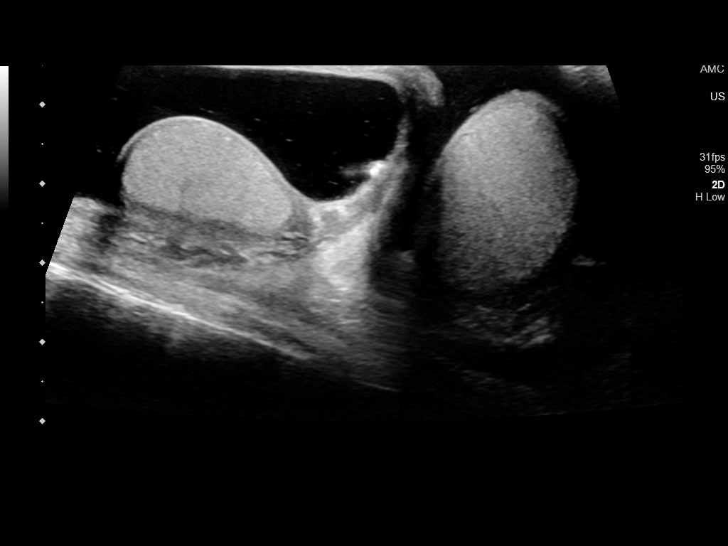
[im 3/34]
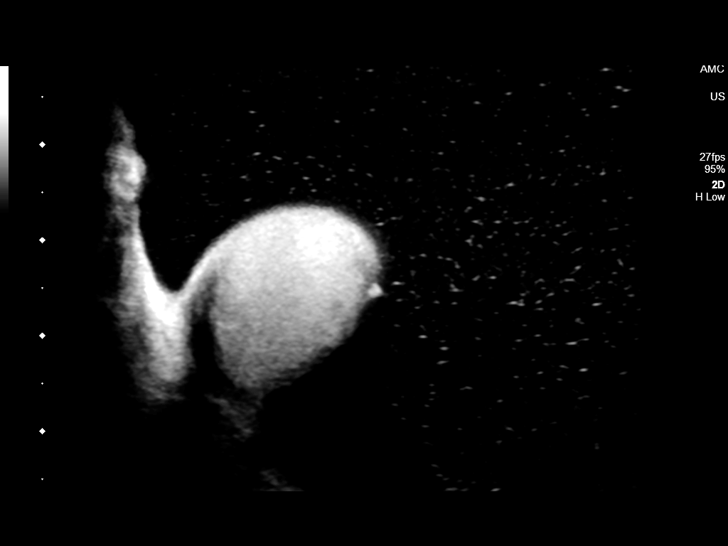
[im 6/34]
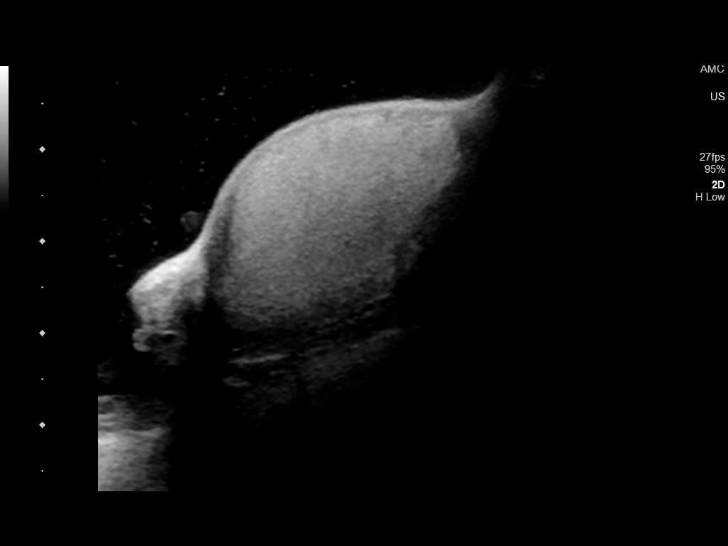
[im 9/34]
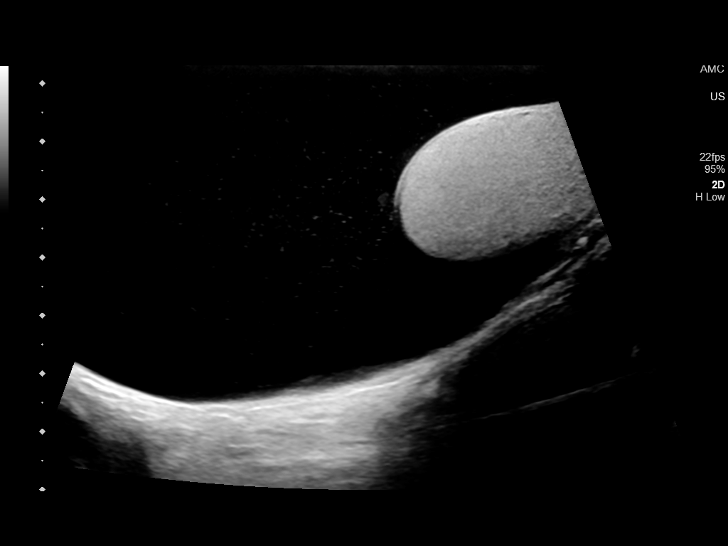
[im 12/34]
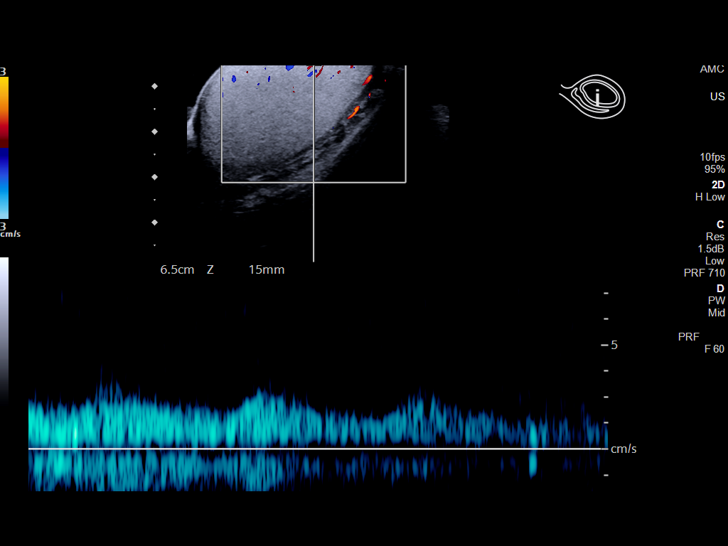
[im 13/34]
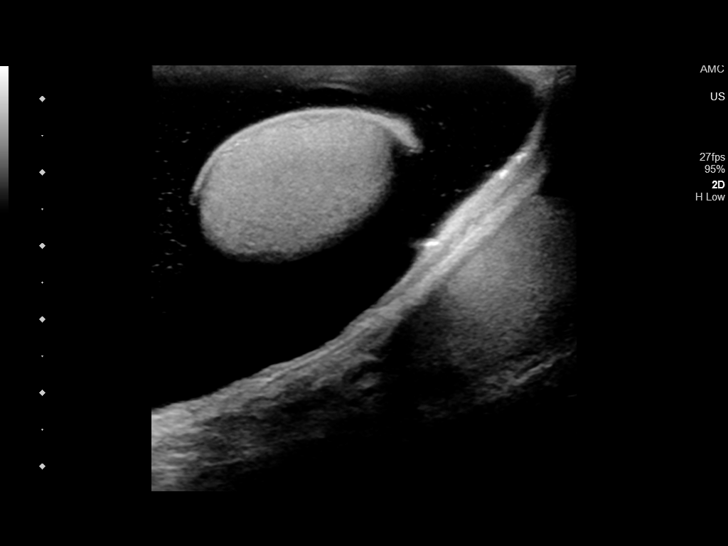
[im 16/34]
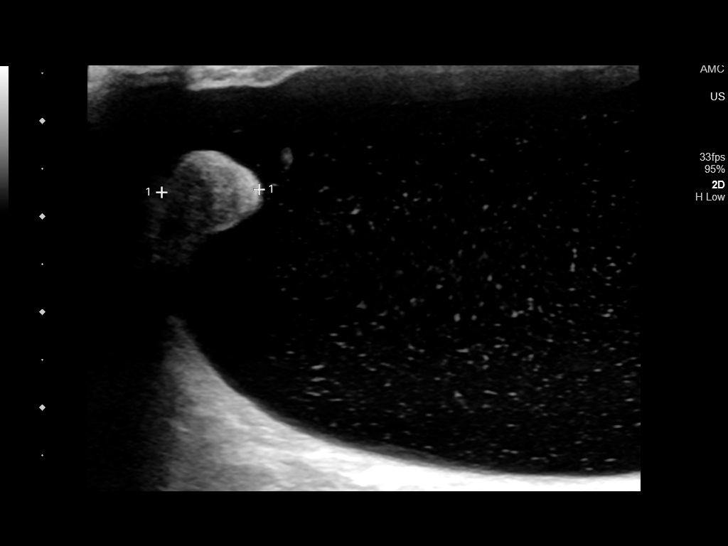
[im 18/34]
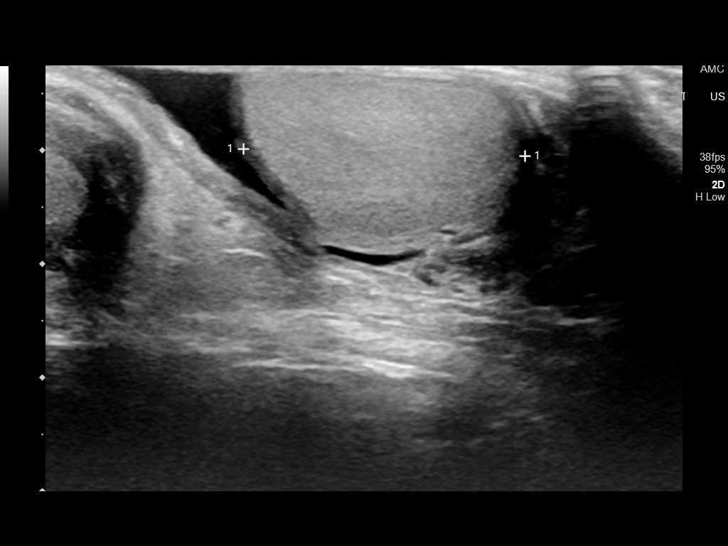
[im 21/34]
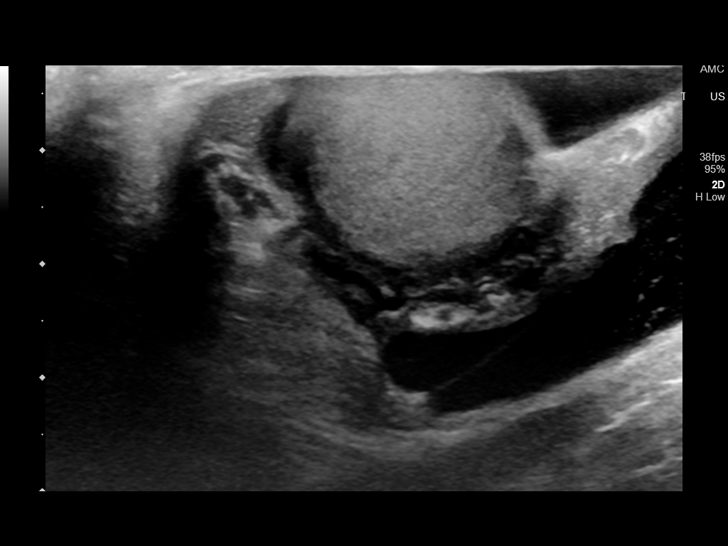
[im 23/34]
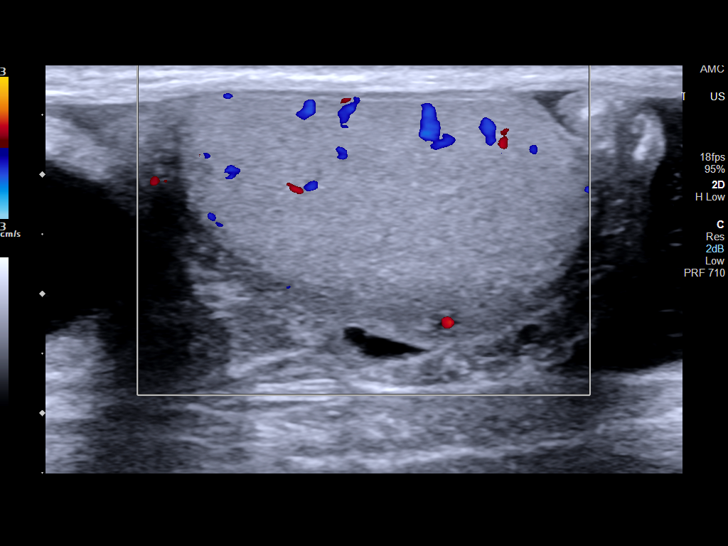
[im 25/34]
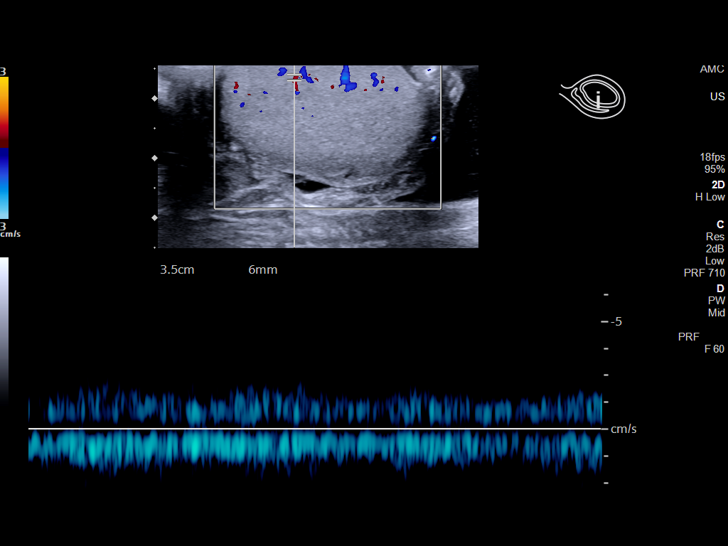
[im 28/34]
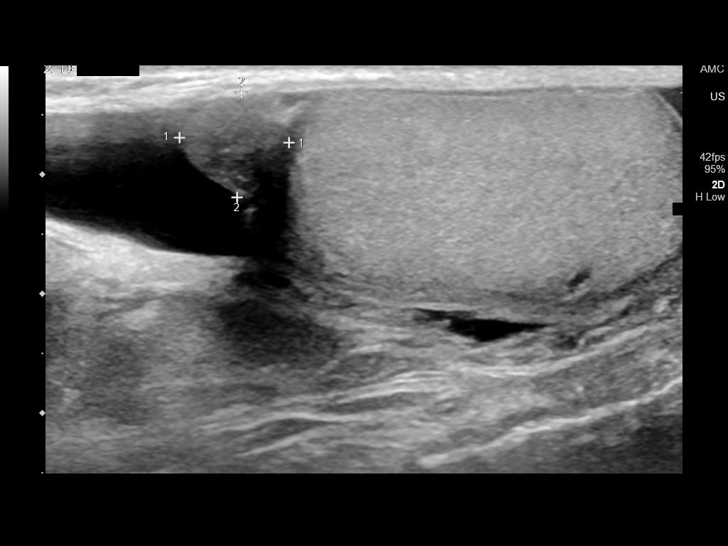
[im 31/34]
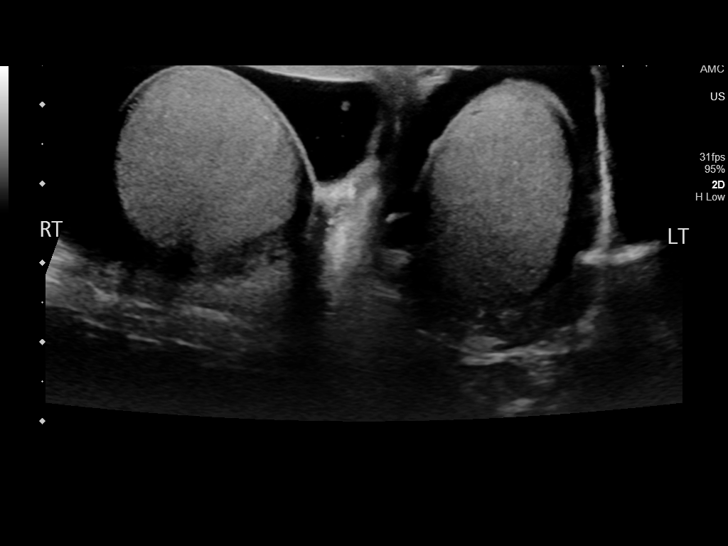
[im 34/34]
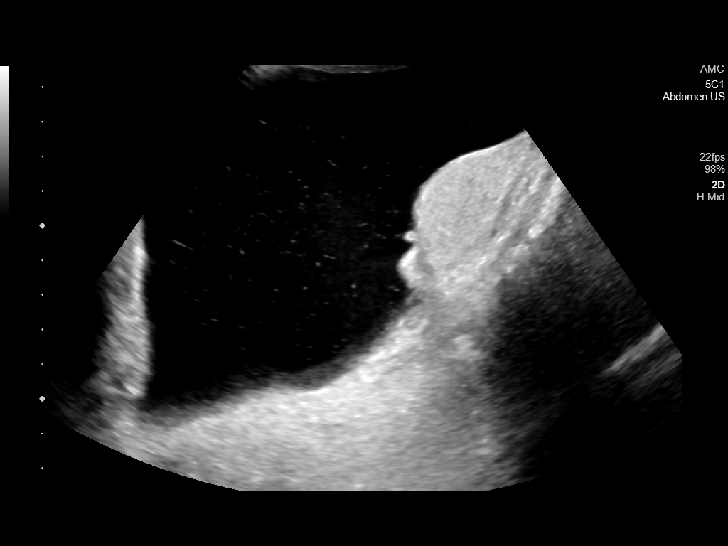

[14 of 25 positions shown; findings below may reference images not displayed]

FINDINGS: Right testicle

Measurements: 4.6 x 2.5 x 2.5 cm. No mass or microlithiasis
visualized.

Left testicle

Measurements: 3.6 x 2.1 x 2.5 sent. No mass or microlithiasis
visualized.

Right epididymis:  Normal in size and appearance.

Left epididymis:  Normal in size and appearance.

Hydrocele: Large right and small left hydrocele. The right hydrocele
was seen on prior CT in [8F].

Varicocele:  None visualized.

Pulsed Doppler interrogation of both testes demonstrates normal low
resistance arterial and venous waveforms bilaterally.
IMPRESSION: 1. No acute abnormality.
2. Chronic large right and small left hydroceles.

## 2019-10-04 IMAGING — CR DG HIP (WITH OR WITHOUT PELVIS) 2-3V*R*
1 series · 4 of 4 positions shown · non-contrast
Comparison: CT abdomen pelvis dated [DATE].

CLINICAL DATA: Groin and hip pain.

EXAM:
DG HIP (WITH OR WITHOUT PELVIS) 2-3V RIGHT

[Series 1: dg hip unilat w or w/o pelvis 2-3 views  · non-contrast · 0.14mm/px · 4 of 4 slices shown]
[im 1/4]
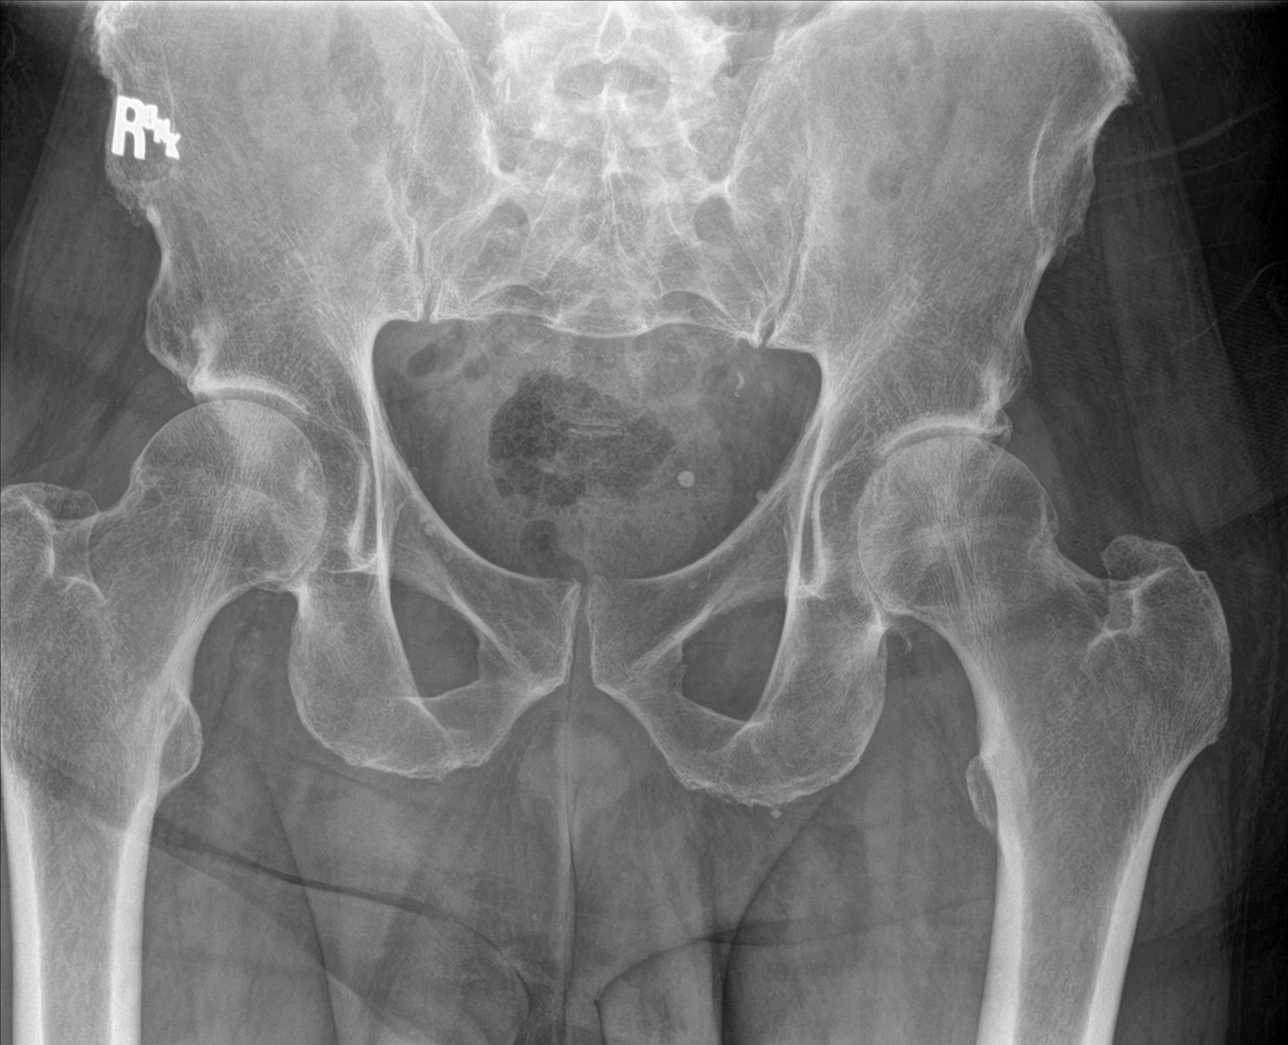
[im 2/4]
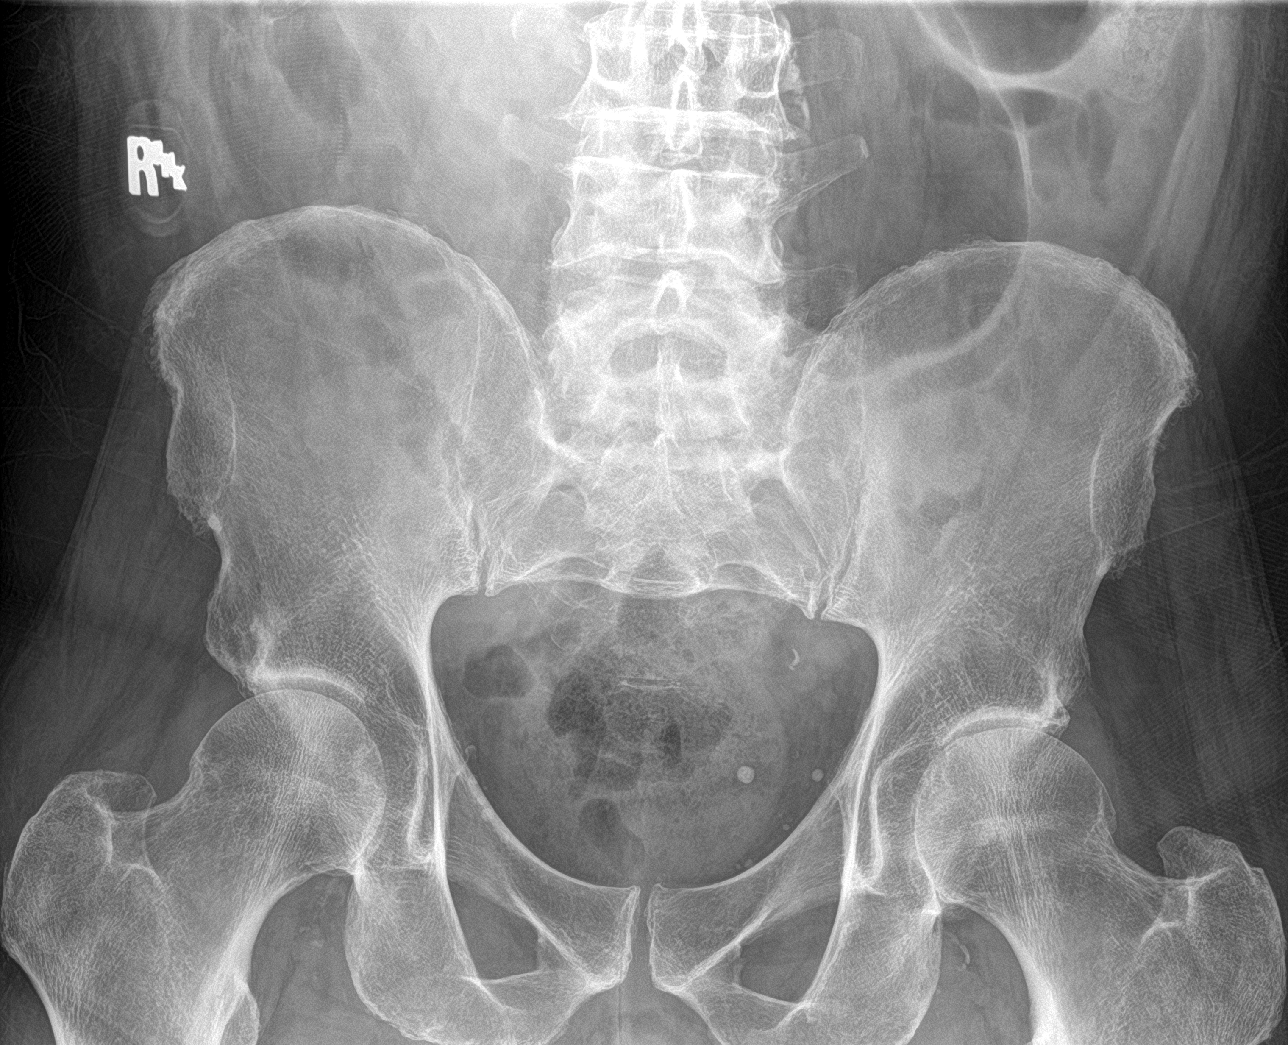
[im 3/4]
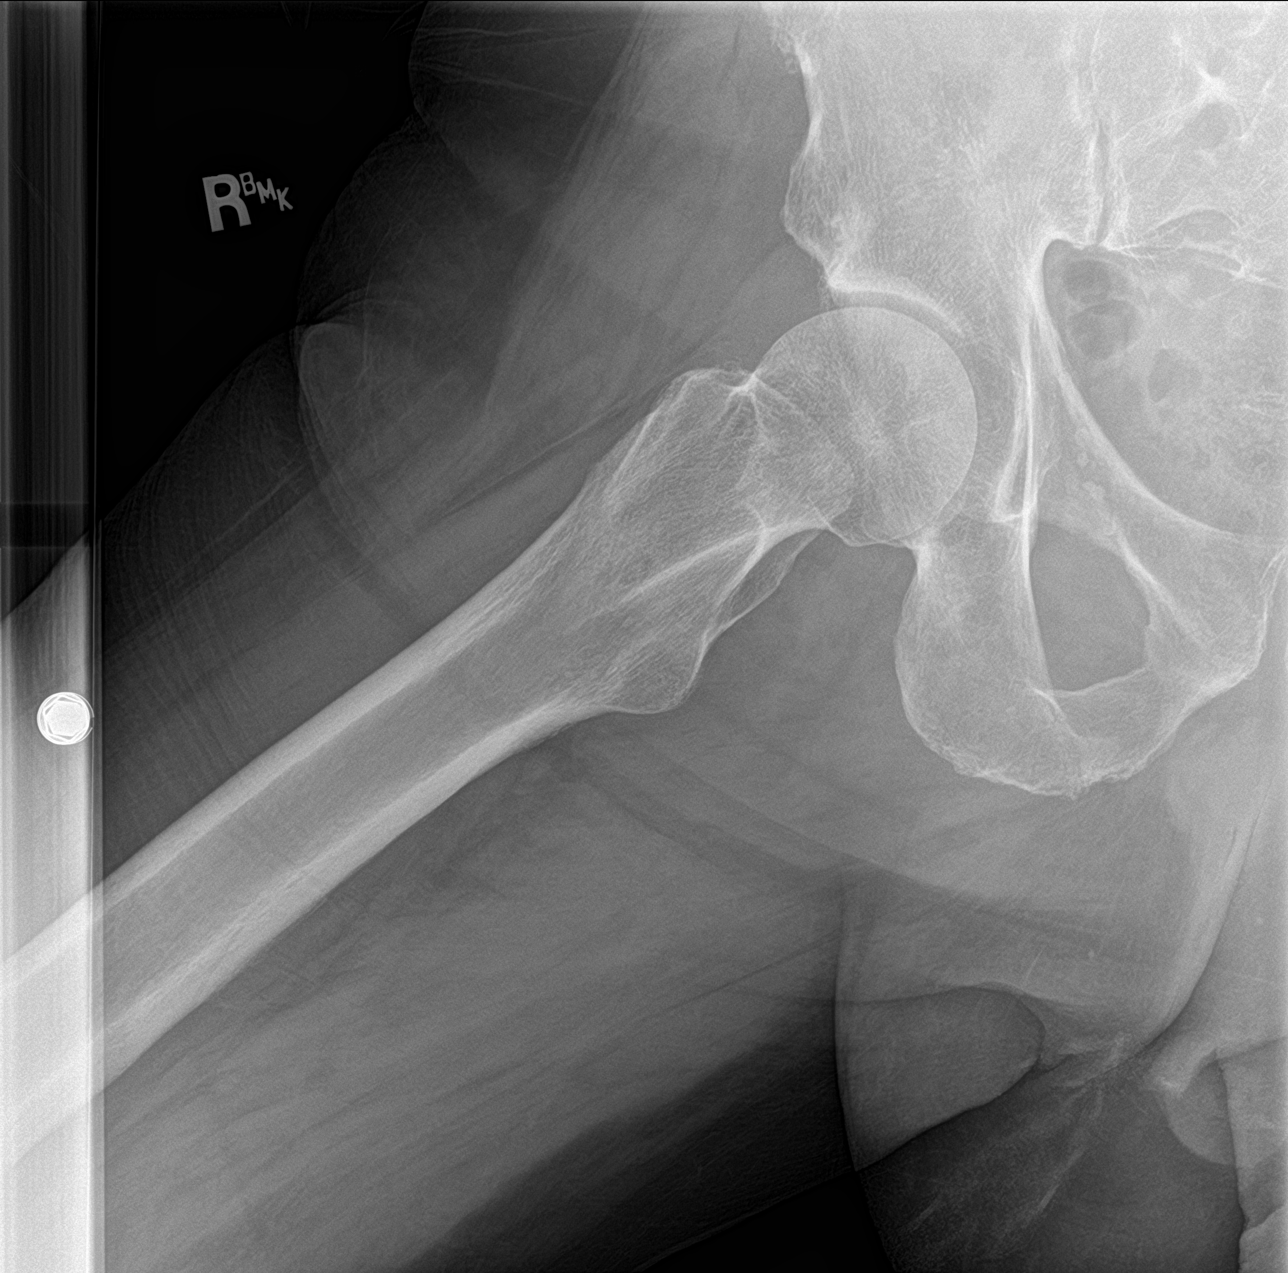
[im 4/4]
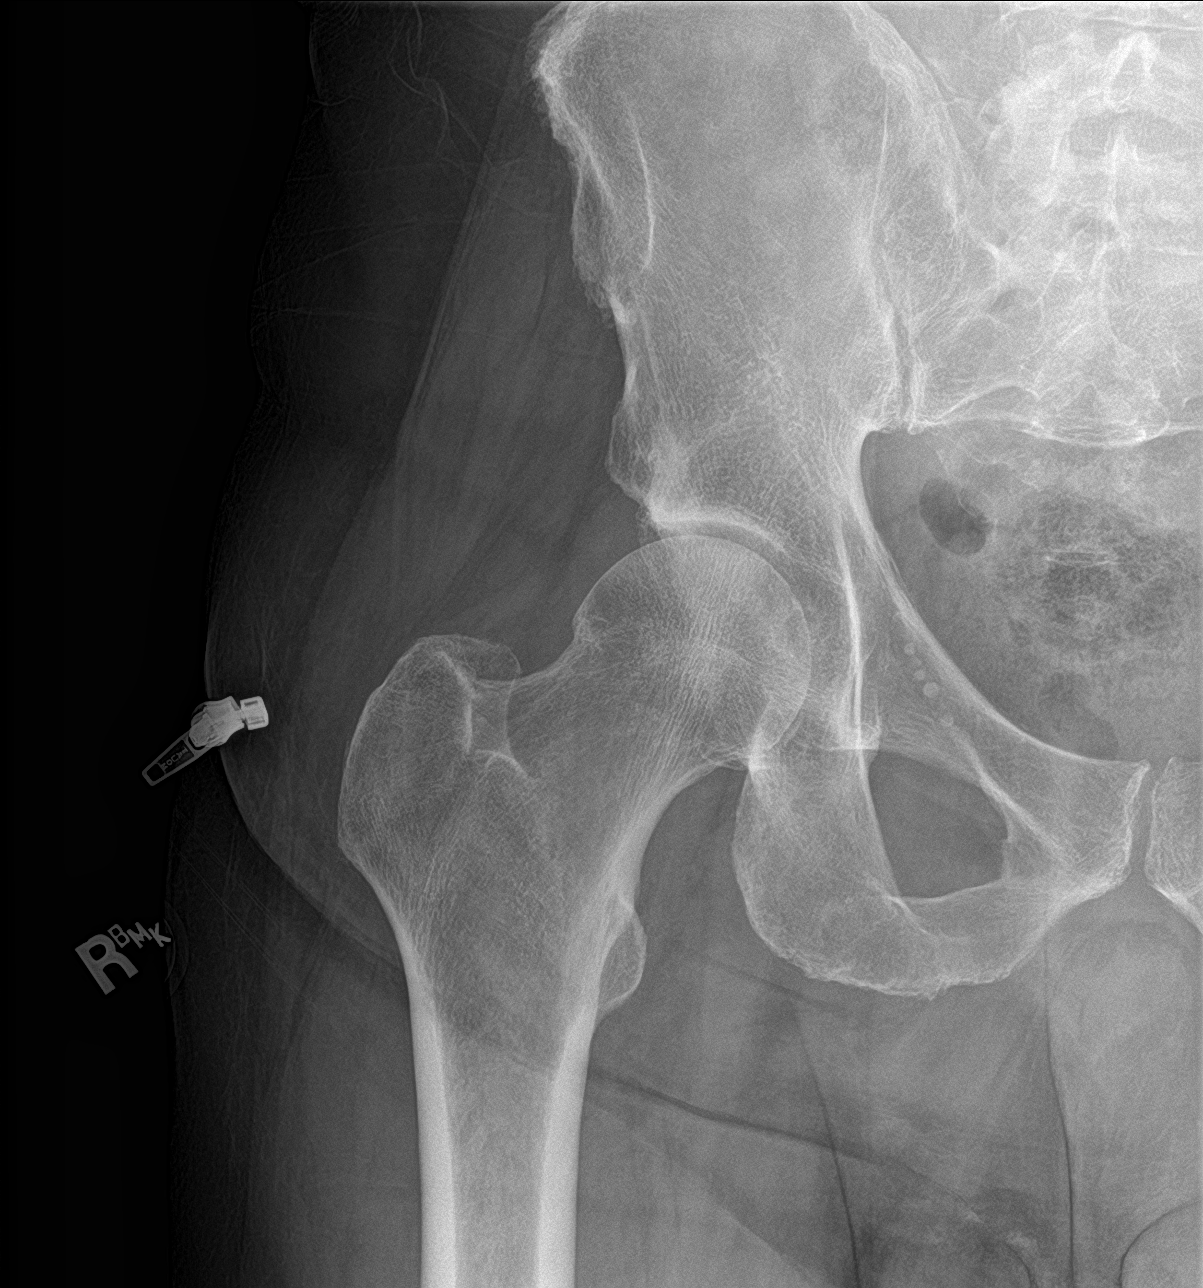

[4 of 4 positions shown; findings below may reference images not displayed]

FINDINGS: No acute fracture or dislocation. Unchanged mild left hip
osteoarthritis. Bone mineralization is normal. Soft tissues are
unremarkable.
IMPRESSION: 1. No acute osseous abnormality.

## 2019-10-04 NOTE — ED Notes (Signed)
Patient transported to Ultrasound 

## 2019-10-04 NOTE — ED Triage Notes (Signed)
Pt presents w/ reports of c/o groin pain. Pt has PMH of alzheimer's and prostate CA. Pt's wife states his testicles are more swollen w/ more frequent c/o pain. Pt also c/o hip pain with standing. Pt's wife states that pt has more c/o pain in hips w/ ambulation. Pt does not recall this during triage. Pt uses a cane occasionally for walking.

## 2019-10-05 ENCOUNTER — Emergency Department
Admission: EM | Admit: 2019-10-05 | Discharge: 2019-10-05 | Disposition: A | Discharge status: Home / Self Care | Payer: Medicare HMO | Attending: Emergency Medicine | Admitting: Emergency Medicine

## 2019-10-05 DIAGNOSIS — N433 Hydrocele, unspecified: Secondary | ICD-10-CM

## 2019-10-05 DIAGNOSIS — R1030 Lower abdominal pain, unspecified: Secondary | ICD-10-CM

## 2019-10-05 DIAGNOSIS — N5089 Other specified disorders of the male genital organs: Secondary | ICD-10-CM

## 2019-10-05 NOTE — ED Notes (Signed)
Patient discharged to home per MD order. Patient in stable condition, and deemed medically cleared by ED provider for discharge. Discharge instructions reviewed with patient/family using "Teach Back"; verbalized understanding of medication education and administration, and information about follow-up care. Denies further concerns. ° °

## 2019-10-05 NOTE — ED Notes (Signed)
Pt brought in for wife pt has hx of alzheimer's is a poor historian. Wife states he has been co bilat hip pain this week worse to right hip. States is ambulatory but co pain when standing or moving. Pt denies any pain at this time and does not recall symptoms. Wife states she also noted some swelling to groin area with lighter tone to skin. States was recently dx with prostate cancer but is not receiving treatment at this time. Pt has been urinating without difficulty, denies any trauma.

## 2019-10-05 NOTE — ED Provider Notes (Signed)
[facility] Emergency Department Provider Note  ____________________________________________  Time seen: Approximately 3:30 AM  I have reviewed the triage vital signs and the nursing notes.   HISTORY  Chief Complaint Groin Pain  Level 5 caveat:  Portions of the history and physical were unable to be obtained due to dementia   HPI Alex [name] is a 79 y.o. male with a history of Alzheimer's, BPH, prostate cancer, hypertension, bilateral complex hydroceles who presents for evaluation of right hip pain and testicular pain.  Wife reports that patient symptoms are chronic however patient has been complaining more often of testicular pain over the last 2 weeks.  She had an appointment at the [facility] for him and when she called today to confirm it she was told that the appointment was actually last week.  At that time she was instructed to bring him into the emergency room for evaluation.  No falls, no dysuria, no abdominal pain, no penile discharge, no vomiting or diarrhea, no fever or chills.  At this point patient reports no pain.  His wife reports that the pain is intermittent.  He has pain in his right hip mostly when he walks.  She also noticed that the swelling of his testicle has gotten more pronounced over the last 2 weeks.  Past Medical History:  Diagnosis Date  . Alzheimer's dementia ([place])   . BPH (benign prostatic hyperplasia)   . CAD (coronary artery disease)   . Cancer [facility])    Prostate Cancer per wife  . Dementia ([place])   . Hypertension     Patient Active Problem List   Diagnosis Date Noted  . Acute mesenteric ischemia ([place])   . Ambulatory dysfunction 11/29/2017  . Pneumatosis intestinalis   . Perforated gastric ulcer ([place]) 11/21/2017  . Chest pain 06/27/2015  . Uncontrolled hypertension 06/27/2015  . Bradycardia 06/27/2015  . Pleural thickening 06/27/2015    Past Surgical History:  Procedure Laterality Date  . CORONARY ANGIOPLASTY WITH STENT  PLACEMENT    . HERNIA REPAIR      Prior to Admission medications   Medication Sig Start Date End Date Taking? Authorizing Provider  amLODipine (NORVASC) 5 MG tablet Take 5 mg by mouth daily.    [provider]  aspirin EC 81 MG tablet Take 81 mg by mouth daily.    [provider]  cloNIDine (CATAPRES) 0.1 MG tablet Take 1 tablet (0.1 mg total) by mouth 2 (two) times daily as needed. Patient not taking: Reported on 11/21/2017 04/28/17   Paulette Blanch, MD  donepezil (ARICEPT) 10 MG tablet Take 1 tablet (10 mg total) by mouth at bedtime. Patient not taking: Reported on 08/15/2016 06/29/15   Loletha Grayer, MD  DULoxetine (CYMBALTA) 30 MG capsule Take 1 capsule (30 mg total) by mouth daily. 12/01/17 12/01/18  Salary, Avel Peace, MD  finasteride (PROSCAR) 5 MG tablet Take 1 tablet (5 mg total) by mouth daily. 06/29/15   Loletha Grayer, MD  lisinopril (PRINIVIL,ZESTRIL) 40 MG tablet Take 40 mg by mouth daily.    [provider]  pravastatin (PRAVACHOL) 80 MG tablet Take 80 mg by mouth daily.    [provider]  rivastigmine (EXELON) 6 MG capsule Take 6 mg by mouth 2 (two) times daily.    [provider]  sodium chloride (OCEAN) 0.65 % SOLN nasal spray Place 1 spray into both nostrils as needed for congestion. 12/02/17   Salary, Holly Bodily D, MD  traZODone (DESYREL) 50 MG tablet Take 0.5  tablets (25 mg total) by mouth at bedtime. 04/26/18   Schuyler Amor, MD    Allergies Penicillins  Family History  Problem Relation Age of Onset  . Hypertension Mother     Social History Social History   Tobacco Use  . Smoking status: Never Smoker  . Smokeless tobacco: Never Used  Vaping Use  . Vaping Use: Never used  Substance Use Topics  . Alcohol use: Yes    Comment: rarely  . Drug use: No    Review of Systems  Constitutional: Negative for fever. Eyes: Negative for visual changes. ENT: Negative for sore throat. Neck: No neck pain  Cardiovascular: Negative  for chest pain. Respiratory: Negative for shortness of breath. Gastrointestinal: Negative for abdominal pain, vomiting or diarrhea. Genitourinary: Negative for dysuria. + scrotum swelling and pain Musculoskeletal: Negative for back pain. + R hip pain  Skin: Negative for rash. Neurological: Negative for headaches, weakness or numbness. Psych: No SI or HI  ____________________________________________   PHYSICAL EXAM:  VITAL SIGNS: ED Triage Vitals  Enc Vitals Group     BP 10/04/19 1940 125/79     Pulse Rate 10/04/19 1940 66     Resp 10/04/19 1940 20     Temp 10/04/19 1940 98.2 F (36.8 C)     Temp Source 10/04/19 1940 Oral     SpO2 10/04/19 1940 98 %     Weight 10/04/19 1941 187 lb (84.8 kg)     Height 10/04/19 1941 5\' 11"  (1.803 m)     Head Circumference --      Peak Flow --      Pain Score 10/04/19 1941 5     Pain Loc --      Pain Edu? --      Excl. in [place]? --     Constitutional: Alert and oriented x2. Well appearing and in no apparent distress. HEENT:      Head: Normocephalic and atraumatic.         Eyes: Conjunctivae are normal. Sclera is non-icteric.       Mouth/Throat: Mucous membranes are moist.       Neck: Supple with no signs of meningismus. Cardiovascular: Regular rate and rhythm. No murmurs, gallops, or rubs.  Respiratory: Normal respiratory effort. Lungs are clear to auscultation bilaterally.  Gastrointestinal: Soft, non tender, and non distended with positive bowel sounds. No rebound or guarding. Genitourinary: No CVA tenderness.  Bilateral scrotum swelling with no tenderness, no crepitus, no ulceration, no erythema, no warmth Musculoskeletal: Nontender with normal range of motion in all extremities. No edema, cyanosis, or erythema of extremities. Neurologic: Normal speech and language. Face is symmetric. Moving all extremities. No gross focal neurologic deficits are appreciated. Skin: Skin is warm, dry and intact. No rash noted. Psychiatric: Mood and affect  are normal. Speech and behavior are normal.  ____________________________________________   LABS (all labs ordered are listed, but only abnormal results are displayed)  Labs Reviewed  URINALYSIS, COMPLETE (UACMP) WITH MICROSCOPIC - Abnormal; Notable for the following components:      Result Value   Color, Urine YELLOW (*)    APPearance HAZY (*)    Bacteria, UA RARE (*)    All other components within normal limits  CBC WITH DIFFERENTIAL/PLATELET - Abnormal; Notable for the following components:   Platelets 143 (*)    All other components within normal limits  COMPREHENSIVE METABOLIC PANEL - Abnormal; Notable for the following components:   Glucose, Bld 111 (*)    BUN 26 (*)  Creatinine, Ser 1.83 (*)    GFR calc non Af Amer 34 (*)    GFR calc Af Amer 40 (*)    All other components within normal limits   ____________________________________________  EKG  none  ____________________________________________  RADIOLOGY  I have personally reviewed the images performed during this visit and I agree with the Radiologist's read.   Interpretation by Radiologist:  US SCROTUM W/DOPPLER  Result Date: 10/04/2019 CLINICAL DATA:  Scrotal pain and swelling. EXAM: SCROTAL ULTRASOUND DOPPLER ULTRASOUND OF THE TESTICLES TECHNIQUE: Complete ultrasound examination of the testicles, epididymis, and other scrotal structures was performed. Color and spectral Doppler ultrasound were also utilized to evaluate blood flow to the testicles. COMPARISON:  CT abdomen pelvis dated February 17, 2018. FINDINGS: Right testicle Measurements: 4.6 x 2.5 x 2.5 cm. No mass or microlithiasis visualized. Left testicle Measurements: 3.6 x 2.1 x 2.5 sent. No mass or microlithiasis visualized. Right epididymis:  Normal in size and appearance. Left epididymis:  Normal in size and appearance. Hydrocele: Large right and small left hydrocele. The right hydrocele was seen on prior CT in 2019. Varicocele:  None visualized. Pulsed  Doppler interrogation of both testes demonstrates normal low resistance arterial and venous waveforms bilaterally. IMPRESSION: 1. No acute abnormality. 2. 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 Electronically Signed   By: Titus Dubin M.D.   On: 10/04/2019 20:35   DG Hip Unilat W or Wo Pelvis 2-3 Views Right  Result Date: 10/04/2019 CLINICAL DATA:  Groin and hip pain. EXAM: DG HIP (WITH OR WITHOUT PELVIS) 2-3V RIGHT COMPARISON:  CT abdomen pelvis dated February 17, 2018. FINDINGS: No acute fracture or dislocation. Unchanged mild left hip osteoarthritis. Bone mineralization is normal. Soft tissues are unremarkable. IMPRESSION: 1. No acute osseous abnormality. Electronically Signed   By: Titus Dubin M.D.   On: 10/04/2019 20:50     ____________________________________________   PROCEDURES  Procedure(s) performed:none Procedures Critical Care performed:  None ____________________________________________   INITIAL IMPRESSION / ASSESSMENT AND PLAN / ED COURSE   79 y.o. male with a history of Alzheimer's, BPH, prostate cancer, hypertension, bilateral complex hydroceles who presents for evaluation of right hip pain and testicular pain.  Per wife, symptoms are chronic and she brought him here today [facility] since [facility] was a miscommunication and patient missed his appointment last week.  Patient has no complaints at this time.  He is alert and oriented x2.  He does have swelling of bilateral scrotum with no pain, no signs of Fournier's gangrene, no signs of ulceration, no signs of cellulitis or any infection.  Ultrasound showing complex bilateral hydroceles.  Review of epic shows that patient has had these hydroceles at least since 2018 when he was seen by [facility] urology.  There is no signs of torsion.  There is no signs of an inguinal hernia.  There is no signs of UTI.  His hip has no tenderness to palpation, has full painless range of motion, there is no swelling or  erythema.  X-ray visualized by me showing no fracture or dislocation just arthritis which is most likely the cause of patient's pain.  Imaging confirmed by radiology.  At this time recommended close follow-up with urology for further evaluation of complex hydroceles and follow-up with his PCP as well.  His blood work shows no leukocytosis.  His kidney function is slightly elevated when compared to prior with a creatinine going from 1.5-1.8.  Discussed this finding with patient and his wife and  recommended follow-up with PCP for repeat blood work.  Work-up, follow-up, and return precautions discussed with patient and his wife.  Old medical records reviewed.      _____________________________________________ Please note:  Patient was evaluated in Emergency Department today for the symptoms described in the history of present illness. Patient was evaluated in the context of the global COVID-19 pandemic, which necessitated consideration that the patient might be at risk for infection with the SARS-CoV-2 virus that causes COVID-19. Institutional protocols and algorithms that pertain to the evaluation of patients at risk for COVID-19 are in a state of rapid change based on information released by regulatory bodies including the CDC and federal and state organizations. These policies and algorithms were followed during the patient's care in the ED.  Some ED evaluations and interventions may be delayed as a result of limited staffing during the pandemic.   [place] Controlled Substance Database was reviewed by me. ____________________________________________   FINAL CLINICAL IMPRESSION(S) / ED DIAGNOSES   Final diagnoses:  Bilateral hydrocele      NEW MEDICATIONS STARTED DURING THIS VISIT:  ED Discharge Orders    None       Note:  This document was prepared using Dragon voice recognition software and may include unintentional dictation errors.    Alfred Levins, [place], MD 10/05/19 [phone]

## 2019-10-05 NOTE — Discharge Instructions (Signed)
Your kidney function was slightly worse when compared to prior. Make sure to follow up with your doctor at the [facility] for repeat blood work and further evaluation of your kidneys.  Also follow-up with your urologist for your testicular swelling.  Return to the emergency room for new or worsening testicular pain, abdominal pain, nausea or vomiting.

## 2019-12-28 ENCOUNTER — Other Ambulatory Visit: Payer: Self-pay | Admitting: Ophthalmology

## 2019-12-28 DIAGNOSIS — H4901 Third [oculomotor] nerve palsy, right eye: Secondary | ICD-10-CM

## 2019-12-28 DIAGNOSIS — H532 Diplopia: Secondary | ICD-10-CM

## 2020-01-01 ENCOUNTER — Other Ambulatory Visit: Payer: Self-pay

## 2020-01-01 ENCOUNTER — Ambulatory Visit
Admission: RE | Admit: 2020-01-01 | Discharge: 2020-01-01 | Disposition: A | Discharge status: Home / Self Care | Payer: Medicare HMO | Source: Ambulatory Visit | Attending: Ophthalmology | Admitting: Ophthalmology

## 2020-01-01 DIAGNOSIS — H532 Diplopia: Secondary | ICD-10-CM | POA: Diagnosis present

## 2020-01-01 IMAGING — MR MR HEAD WO/W CM
13 series · 42 of 48 positions shown · IV contrast (gadavist)
Comparison: Head CT [DATE]

CLINICAL DATA: Abnormal ocular movement on the right. Visual
disturbance.

EXAM:
MRI HEAD WITHOUT AND WITH CONTRAST
MRA HEAD WITHOUT CONTRAST
TECHNIQUE: Multiplanar, multiecho pulse sequences of the brain and surrounding
structures were obtained without and with intravenous contrast.
Angiographic images of the head were obtained using MRA technique
without contrast.
CONTRAST:  9mL GADAVIST GADOBUTROL 1 MMOL/ML IV SOLN

[Series 5: ax dwi_tracew · axial · 3.0mm · 0.60mm/px · z∈[-101,+54]mm · 4 of 48 slices shown]
[im 1/48]
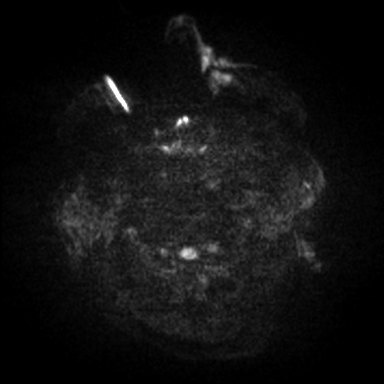
[im 16/48]
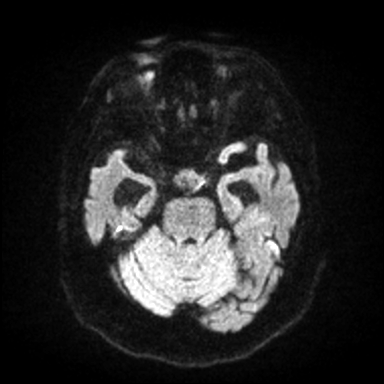
[im 32/48]
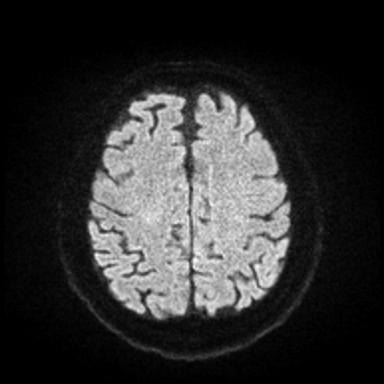
[im 48/48]
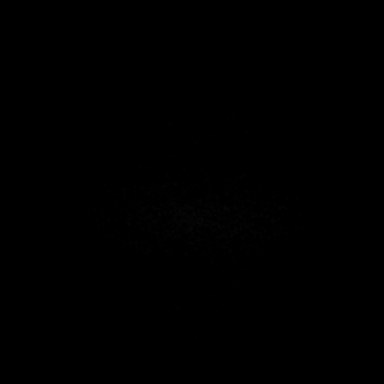

[Series 6: ax dwi_adc · axial · 3.0mm · 0.60mm/px · z∈[-101,+50]mm · 3 of 47 slices shown]
[im 1/47]
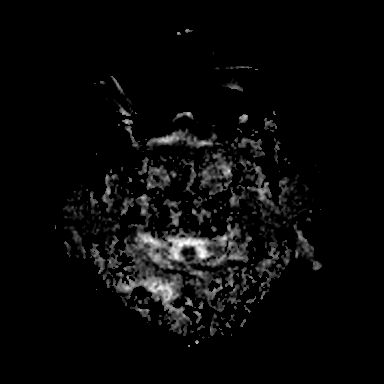
[im 24/47]
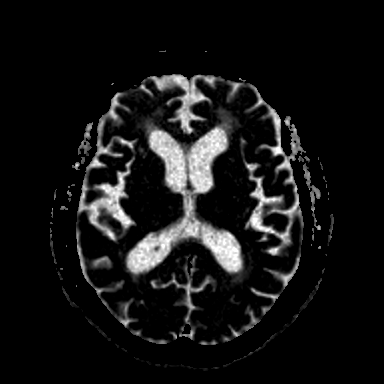
[im 47/47]
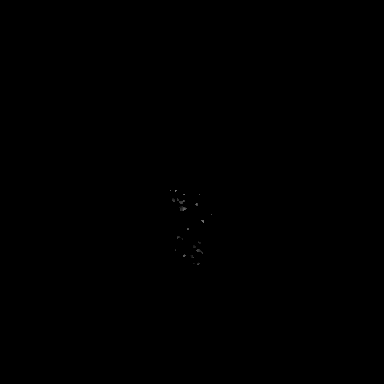

[Series 7: cor dwi_tracew · coronal · 5.0mm · 0.60mm/px · 2 of 40 slices shown]
[im 1/40]
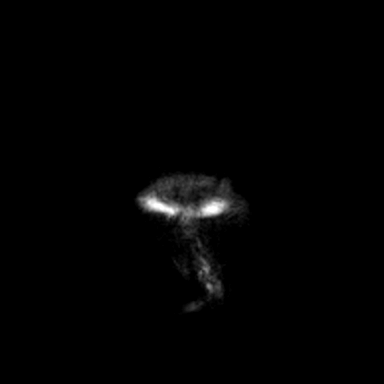
[im 40/40]
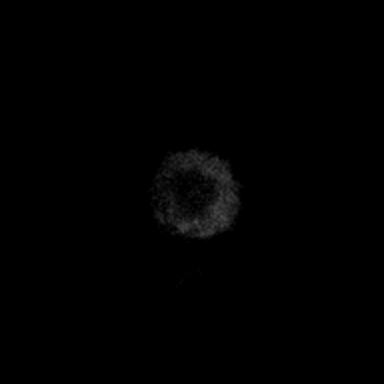

[Series 8: cor dwi_adc · coronal · 5.0mm · 0.60mm/px · 2 of 40 slices shown]
[im 1/40]
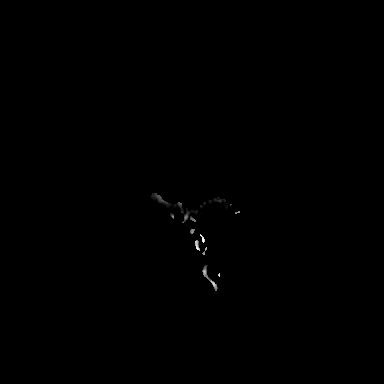
[im 40/40]
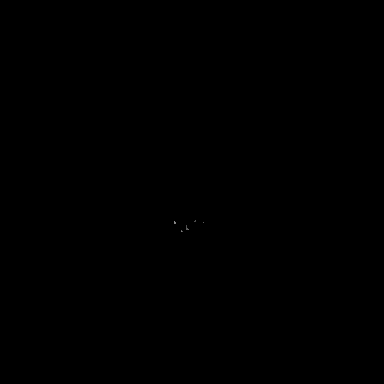

[Series 9: T1 · sagittal · 5.0mm · 0.94mm/px · 1 of 23 slices shown (1 of 2)]
[im 1/23]
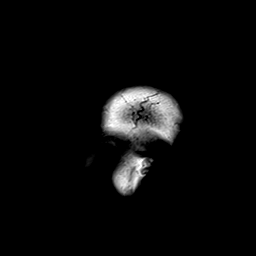

[Series 10: T2 · axial · 5.0mm · 0.45mm/px · z∈[-98,+57]mm · 2 of 27 slices shown (1 of 2)]
[im 1/27]
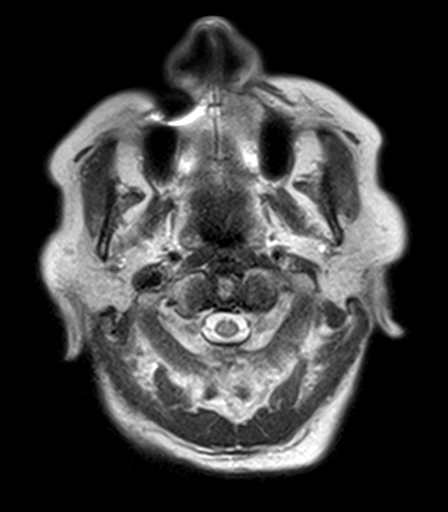
[im 27/27]
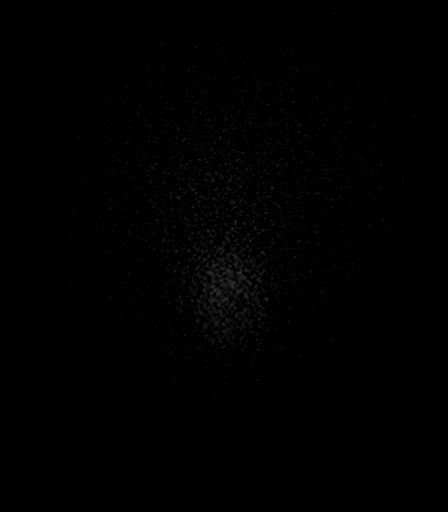

[Series 12: pha_images · axial · 3.0mm · 0.90mm/px · z∈[-106,+67]mm · 3 of 58 slices shown]
[im 1/58]
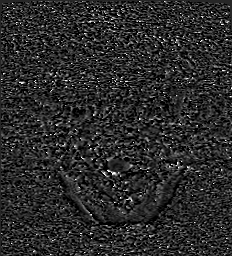
[im 29/58]
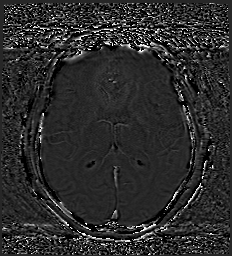
[im 58/58]
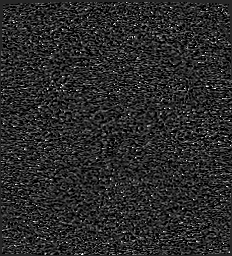

[Series 13: swi_images · axial · 3.0mm · 0.90mm/px · z∈[-109,-52]mm · 2 of 60 slices shown]
[im 1/60]
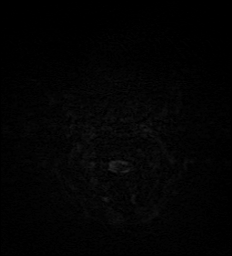
[im 20/60]
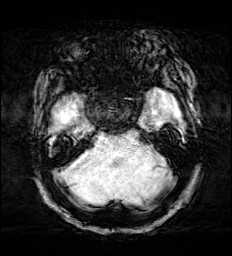

[Series 15: FLAIR · axial · 3.0mm · 0.53mm/px · z∈[-102,+59]mm · 3 of 55 slices shown]
[im 1/55]
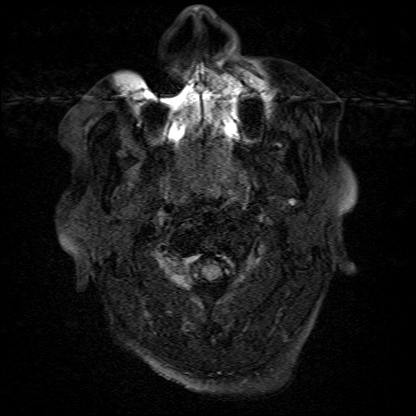
[im 28/55]
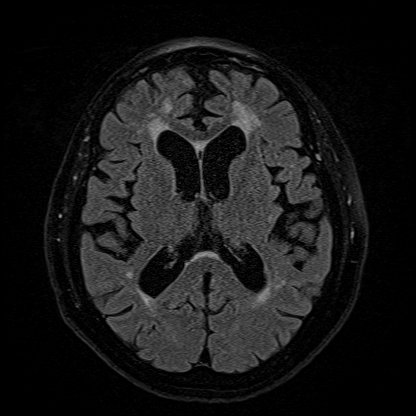
[im 55/55]
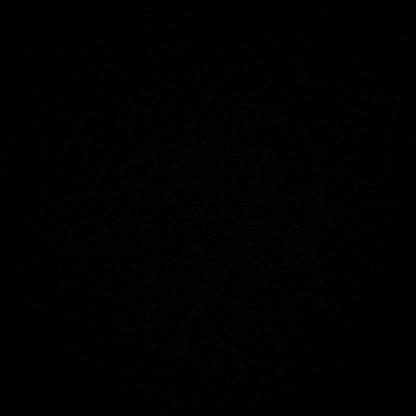

[Series 16: T1 · axial · 1.0mm · 0.98mm/px · z∈[-107,+67]mm · 8 of 176 slices shown (2 of 2)]
[im 1/176]
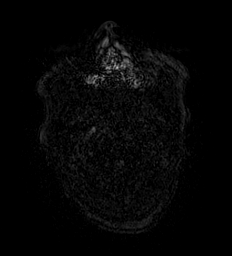
[im 20/176]
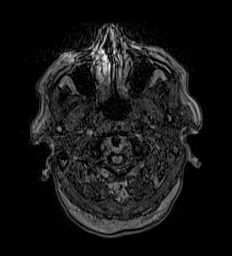
[im 59/176]
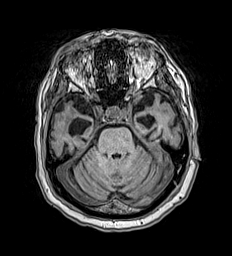
[im 78/176]
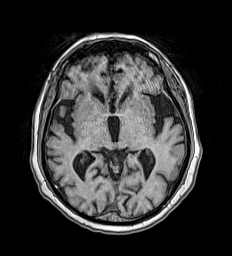
[im 98/176]
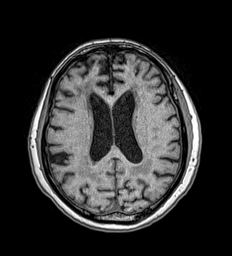
[im 117/176]
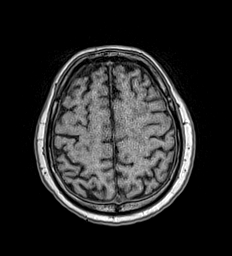
[im 156/176]
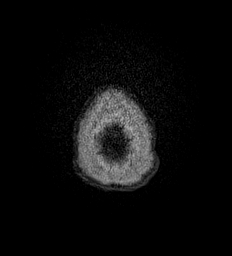
[im 176/176]
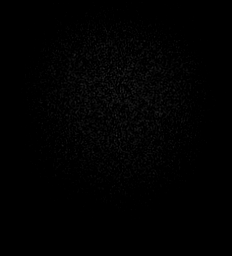

[Series 17: T2 · coronal · 5.0mm · 0.45mm/px · 2 of 31 slices shown (2 of 2)]
[im 1/31]
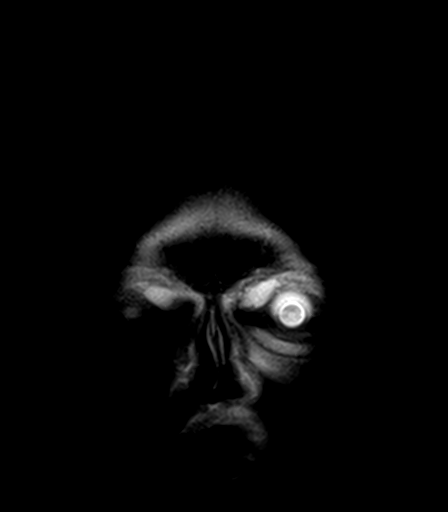
[im 31/31]
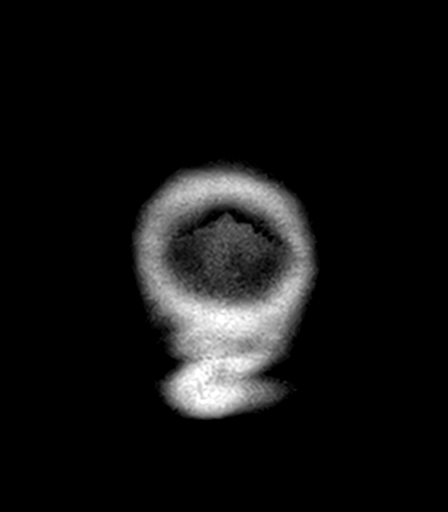

[Series 18: T1 post-contrast · axial · 1.0mm · 0.98mm/px · z∈[-107,+67]mm · 8 of 176 slices shown (1 of 2)]
[im 1/176]
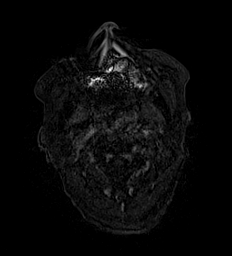
[im 20/176]
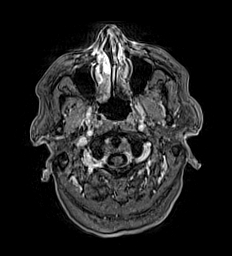
[im 59/176]
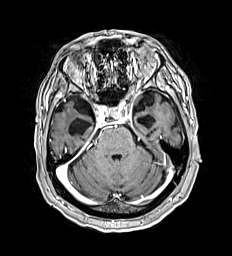
[im 78/176]
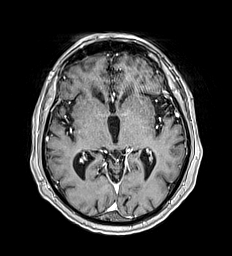
[im 98/176]
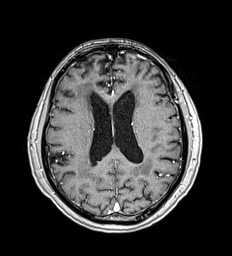
[im 117/176]
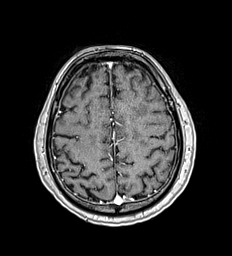
[im 156/176]
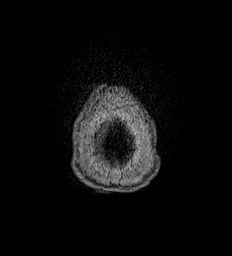
[im 176/176]
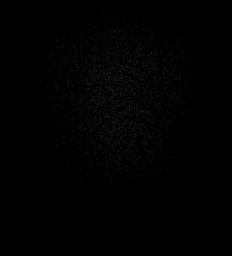

[Series 19: T1 post-contrast · coronal · 5.0mm · 0.90mm/px · 2 of 31 slices shown (2 of 2)]
[im 1/31]
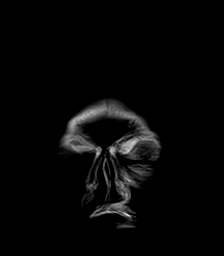
[im 31/31]
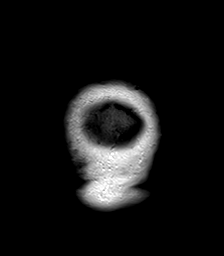

[42 of 48 positions shown; findings below may reference images not displayed]

FINDINGS: MRI HEAD FINDINGS

Brain: Diffusion imaging does not show any acute or subacute
infarction. There is generalized age related volume loss, somewhat
temporal predominant. No focal abnormality affects the brainstem or
cerebellum. Cerebral hemispheres show moderate chronic small-vessel
ischemic change of the deep and subcortical white matter. No
cortical or large vessel territory infarction. No intra-axial mass
lesion, hemorrhage, hydrocephalus or extra-axial collection. After
contrast administration, no abnormal enhancement occurs.

The pituitary gland appears enlarged, measuring up to 1 cm in
cephalo caudal dimension. This raises the possibility of a pituitary
adenoma. I do not see definite cavernous sinus extension, but a
detailed pituitary study was not performed. Consider MRI of the
pituitary gland to assess for pituitary adenoma potential cavernous
sinus extension.

Vascular: Major vessels at the base of the brain show flow.

Skull and upper cervical spine: Negative

Sinuses/Orbits: Clear/normal

Other: Lipoma of the posterior upper neck, not significant.

MRA HEAD FINDINGS

Both internal carotid arteries are widely patent through the skull
base and siphon regions. The anterior and middle cerebral vessels
are patent without proximal stenosis, aneurysm or vascular
malformation. More distal branch vessels show some atherosclerotic
irregularity. Fetal origin right PCA.

Both vertebral arteries are patent to the basilar. No basilar
stenosis. Posterior circulation branch vessels are patent.
Similarly, distal vessels show some atherosclerotic irregularity.
IMPRESSION: 1. No acute brain finding. Age related atrophy with some temporal
lobe predominance. Moderate chronic small-vessel ischemic changes of
the cerebral hemispheric white matter.
2. Intracranial MR angiography does not show any large or medium
vessel occlusion or correctable proximal stenosis. Distal vessel
atherosclerotic irregularity diffusely. No evidence of aneurysm.
3. Enlarged pituitary gland measuring up to 1 cm in cephalo caudal
dimension. Question pituitary adenoma. Consider dedicated MRI of the
pituitary gland to assess this further and investigate for potential
cavernous sinus extension, which could affect the cranial nerves and
extraocular muscles.

## 2020-01-01 IMAGING — MR MR MRA HEAD W/O CM
1 series · 18 of 48 positions shown · IV contrast (gadavist)
Comparison: Head CT [DATE]

CLINICAL DATA: Abnormal ocular movement on the right. Visual
disturbance.

EXAM:
MRI HEAD WITHOUT AND WITH CONTRAST
MRA HEAD WITHOUT CONTRAST
TECHNIQUE: Multiplanar, multiecho pulse sequences of the brain and surrounding
structures were obtained without and with intravenous contrast.
Angiographic images of the head were obtained using MRA technique
without contrast.
CONTRAST:  9mL GADAVIST GADOBUTROL 1 MMOL/ML IV SOLN

[Series 5: TOF · axial · 0.5mm · 0.41mm/px · z∈[-102,-5]mm · 18 of 205 slices shown]
[im 1/205]
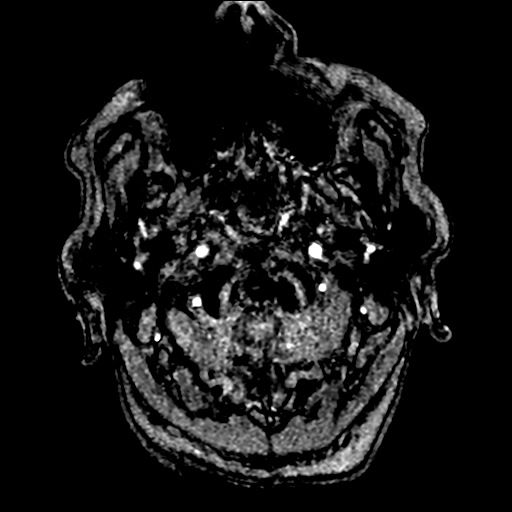
[im 5/205]
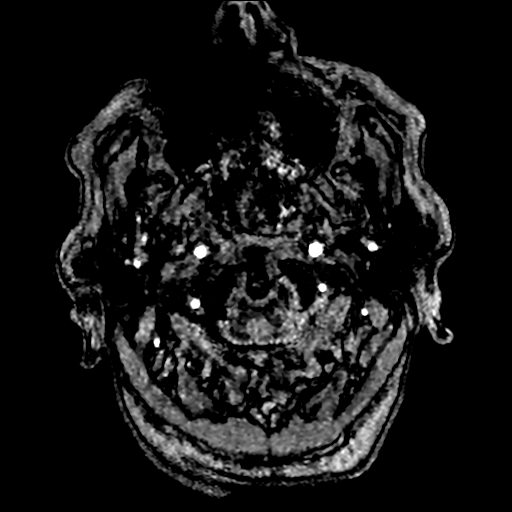
[im 9/205]
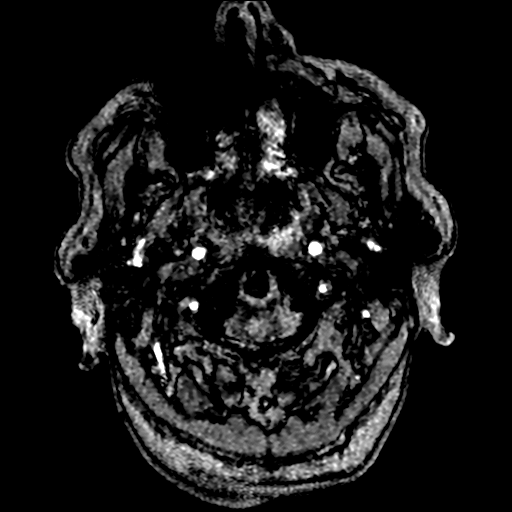
[im 14/205]
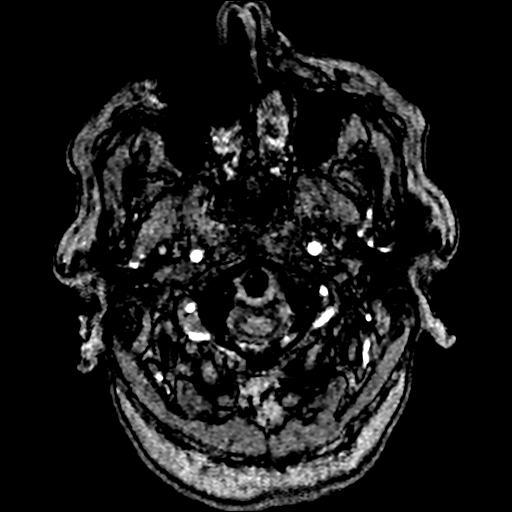
[im 18/205]
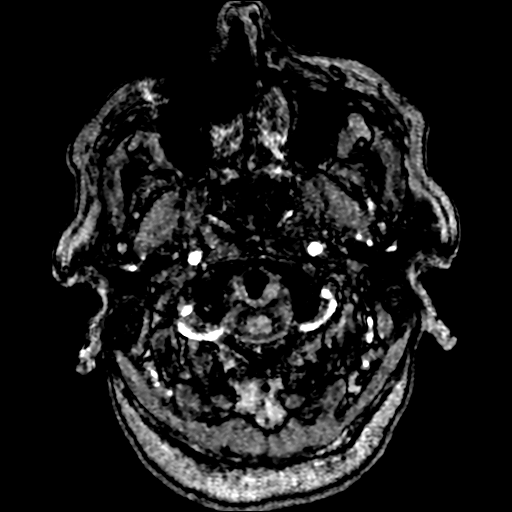
[im 22/205]
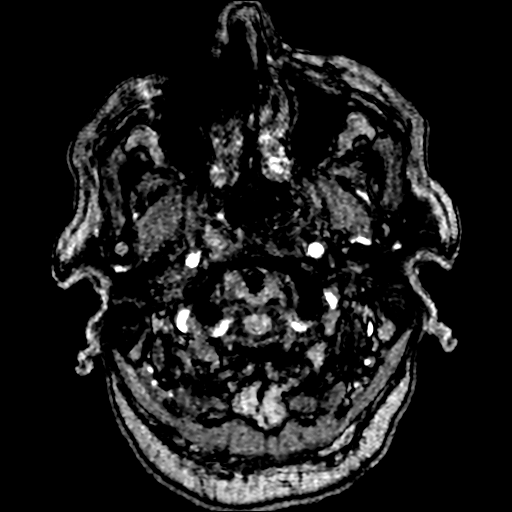
[im 27/205]
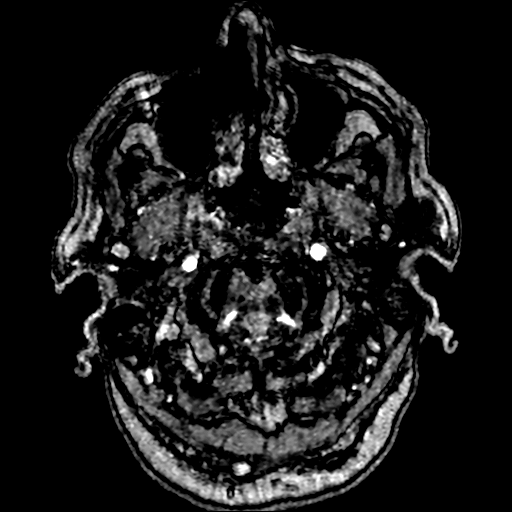
[im 31/205]
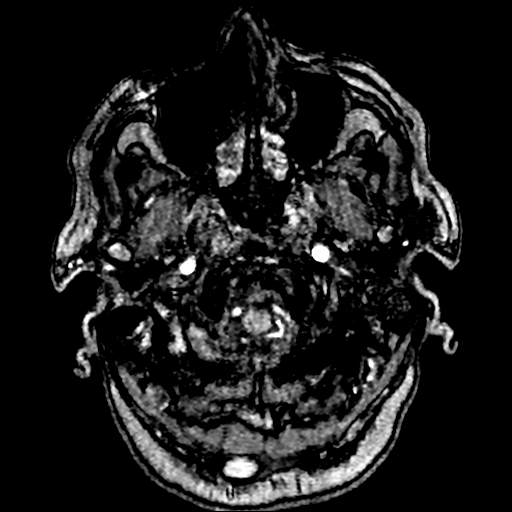
[im 35/205]
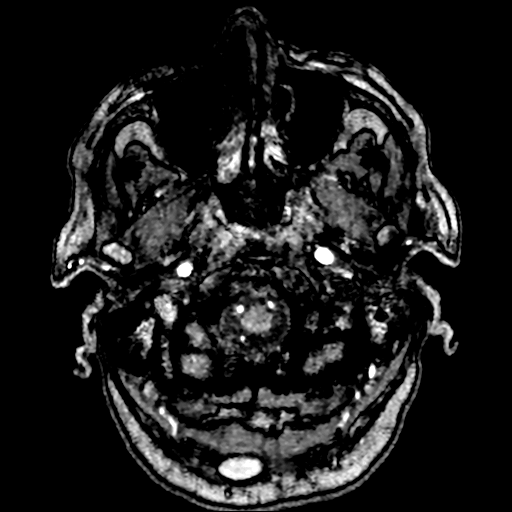
[im 40/205]
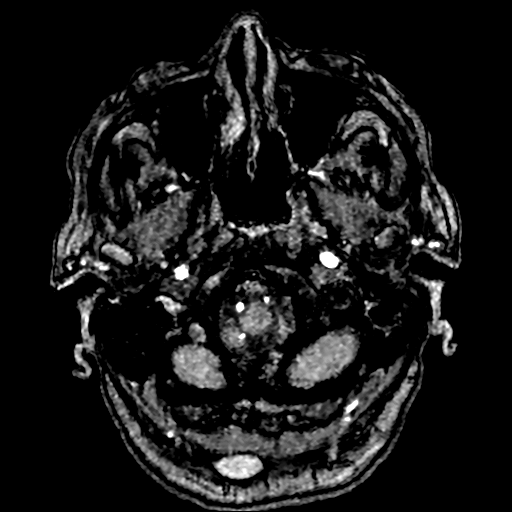
[im 66/205]
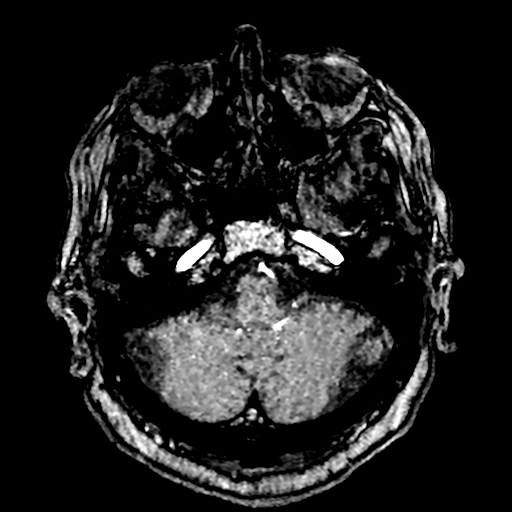
[im 92/205]
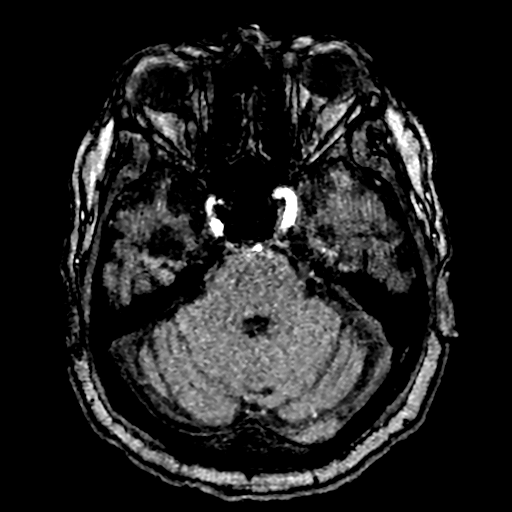
[im 105/205]
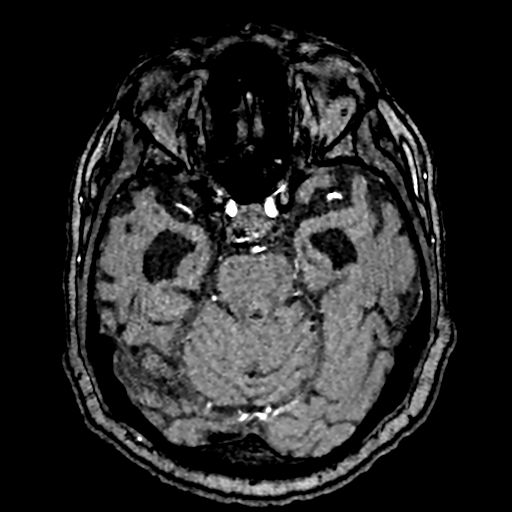
[im 118/205]
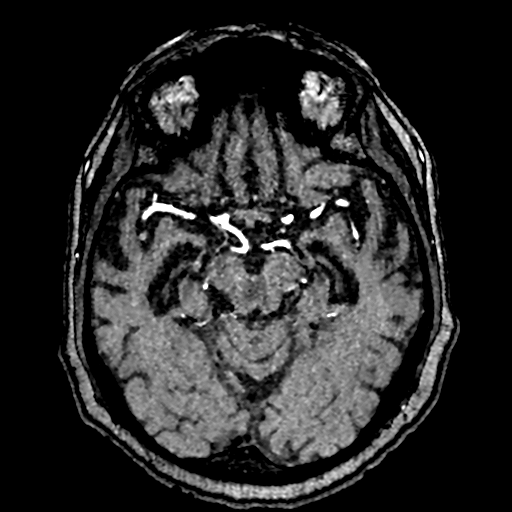
[im 144/205]
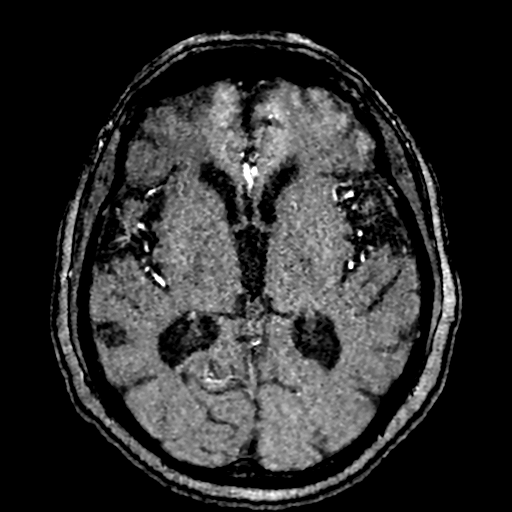
[im 170/205]
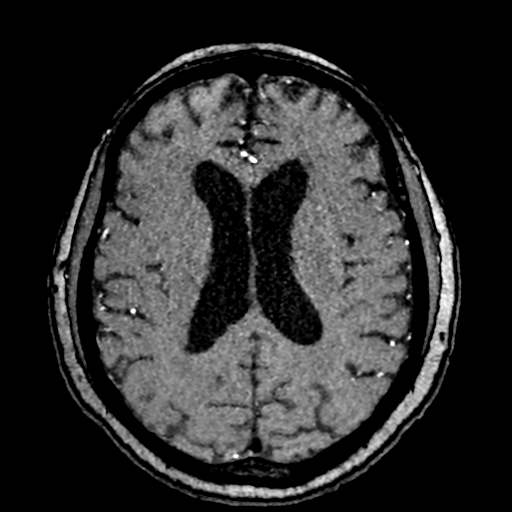
[im 174/205]
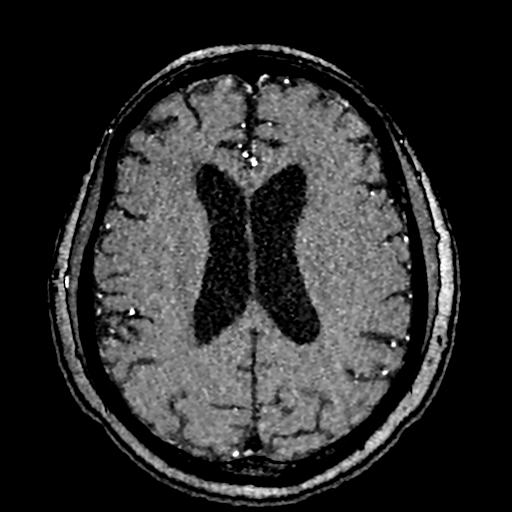
[im 196/205]
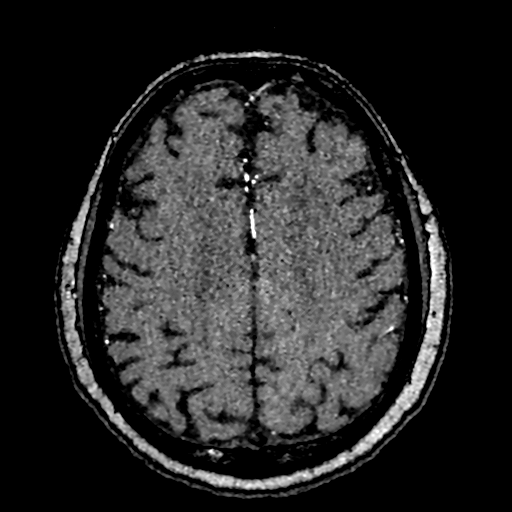

[18 of 48 positions shown; findings below may reference images not displayed]

FINDINGS: MRI HEAD FINDINGS

Brain: Diffusion imaging does not show any acute or subacute
infarction. There is generalized age related volume loss, somewhat
temporal predominant. No focal abnormality affects the brainstem or
cerebellum. Cerebral hemispheres show moderate chronic small-vessel
ischemic change of the deep and subcortical white matter. No
cortical or large vessel territory infarction. No intra-axial mass
lesion, hemorrhage, hydrocephalus or extra-axial collection. After
contrast administration, no abnormal enhancement occurs.

The pituitary gland appears enlarged, measuring up to 1 cm in
cephalo caudal dimension. This raises the possibility of a pituitary
adenoma. I do not see definite cavernous sinus extension, but a
detailed pituitary study was not performed. Consider MRI of the
pituitary gland to assess for pituitary adenoma potential cavernous
sinus extension.

Vascular: Major vessels at the base of the brain show flow.

Skull and upper cervical spine: Negative

Sinuses/Orbits: Clear/normal

Other: Lipoma of the posterior upper neck, not significant.

MRA HEAD FINDINGS

Both internal carotid arteries are widely patent through the skull
base and siphon regions. The anterior and middle cerebral vessels
are patent without proximal stenosis, aneurysm or vascular
malformation. More distal branch vessels show some atherosclerotic
irregularity. Fetal origin right PCA.

Both vertebral arteries are patent to the basilar. No basilar
stenosis. Posterior circulation branch vessels are patent.
Similarly, distal vessels show some atherosclerotic irregularity.
IMPRESSION: 1. No acute brain finding. Age related atrophy with some temporal
lobe predominance. Moderate chronic small-vessel ischemic changes of
the cerebral hemispheric white matter.
2. Intracranial MR angiography does not show any large or medium
vessel occlusion or correctable proximal stenosis. Distal vessel
atherosclerotic irregularity diffusely. No evidence of aneurysm.
3. Enlarged pituitary gland measuring up to 1 cm in cephalo caudal
dimension. Question pituitary adenoma. Consider dedicated MRI of the
pituitary gland to assess this further and investigate for potential
cavernous sinus extension, which could affect the cranial nerves and
extraocular muscles.

## 2020-01-01 MED ORDER — GADOBUTROL 1 MMOL/ML IV SOLN
9.0000 mL | Freq: Once | INTRAVENOUS | Status: AC | PRN
  Administered 2020-01-01: 9 mL via INTRAVENOUS

## 2020-01-09 ENCOUNTER — Other Ambulatory Visit: Payer: Self-pay | Admitting: Ophthalmology

## 2020-01-09 DIAGNOSIS — H4901 Third [oculomotor] nerve palsy, right eye: Secondary | ICD-10-CM

## 2020-01-23 ENCOUNTER — Other Ambulatory Visit: Payer: Self-pay

## 2020-01-23 ENCOUNTER — Ambulatory Visit
Admission: RE | Admit: 2020-01-23 | Discharge: 2020-01-23 | Disposition: A | Discharge status: Home / Self Care | Payer: Medicare HMO | Source: Ambulatory Visit | Attending: Ophthalmology | Admitting: Ophthalmology

## 2020-01-23 DIAGNOSIS — H4901 Third [oculomotor] nerve palsy, right eye: Secondary | ICD-10-CM

## 2020-01-23 IMAGING — MR MR HEAD WO/W CM
12 of 18 series · 26 of 48 positions shown · IV contrast (gadavist)
Comparison: [DATE]

CLINICAL DATA: Follow-up enlarged pituitary gland. Abnormal ocular
movement.

EXAM:
MRI HEAD WITHOUT AND WITH CONTRAST
TECHNIQUE: Multiplanar, multiecho pulse sequences of the brain and surrounding
structures were obtained without and with intravenous contrast.
CONTRAST:  9mL GADAVIST GADOBUTROL 1 MMOL/ML IV SOLN

[Series 7: T1 · sagittal · 5.0mm · 0.62mm/px · 1 of 25 slices shown]
[im 1/25]
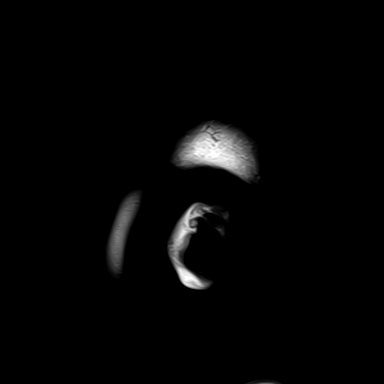

[Series 8: T2 · axial · 5.0mm · 0.53mm/px · z∈[-94,+48]mm · 2 of 25 slices shown]
[im 1/25]
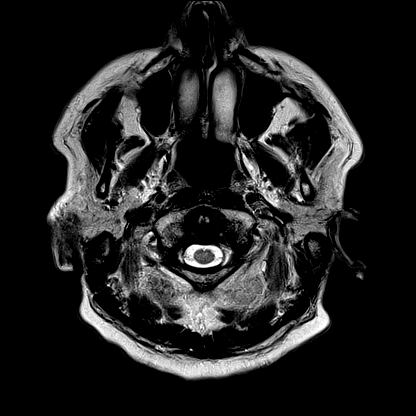
[im 25/25]
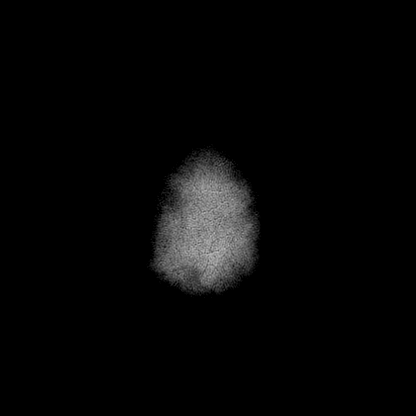

[Series 10: FLAIR · axial · 3.0mm · 0.53mm/px · z∈[-103,+57]mm · 5 of 55 slices shown]
[im 1/55]
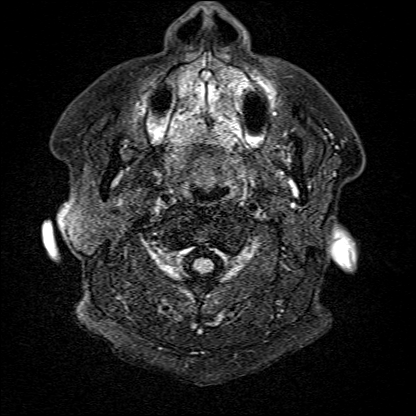
[im 14/55]
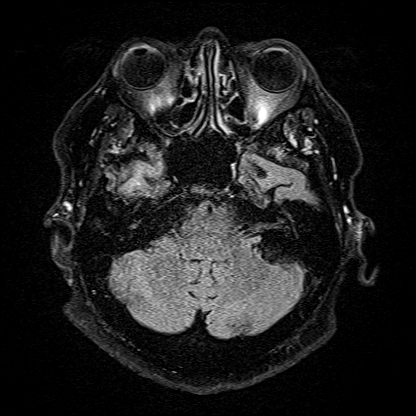
[im 28/55]
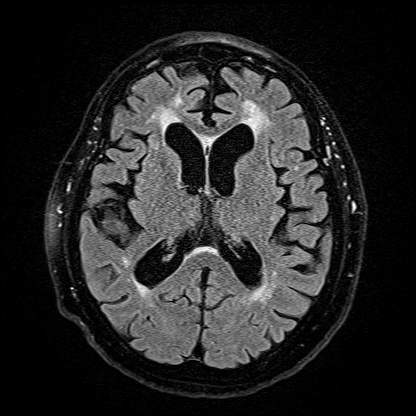
[im 41/55]
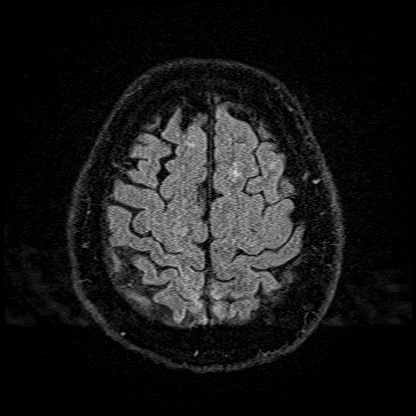
[im 55/55]
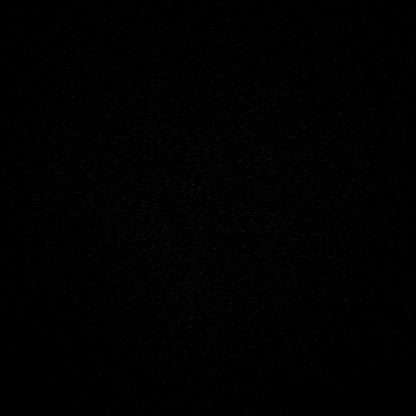

[Series 14: T1 post-contrast · coronal · 3.0mm · 0.28mm/px · 1 of 11 slices shown (1 of 9)]
[im 1/11]
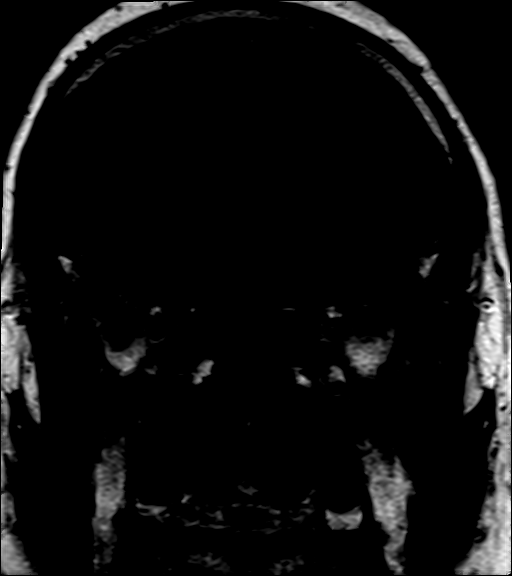

[Series 15: T1 post-contrast · coronal · 3.0mm · 0.28mm/px · 1 of 11 slices shown (2 of 9)]
[im 1/11]
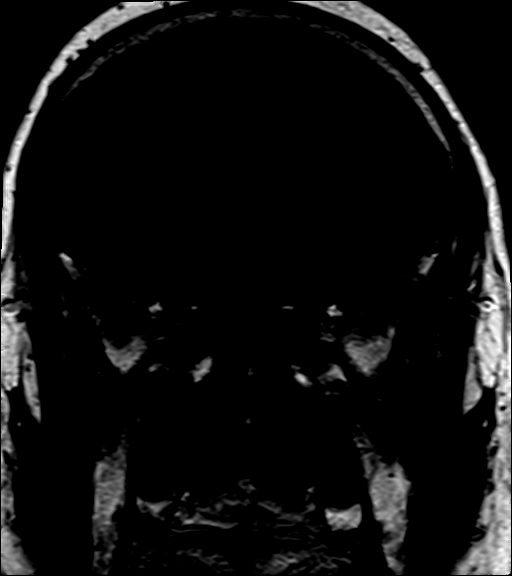

[Series 16: T1 post-contrast · coronal · 3.0mm · 0.28mm/px · 1 of 11 slices shown (3 of 9)]
[im 1/11]
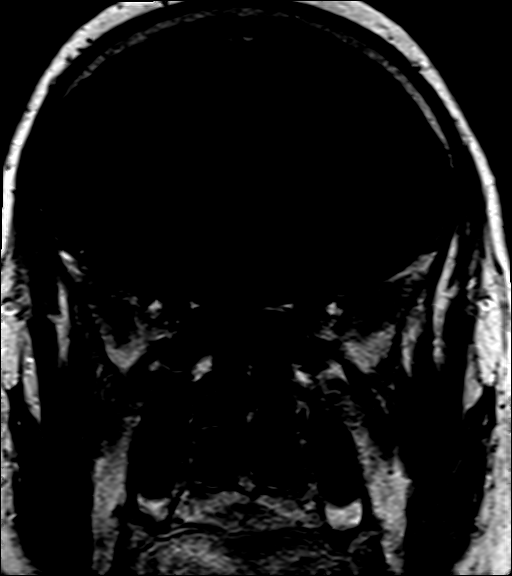

[Series 17: T1 post-contrast · coronal · 3.0mm · 0.28mm/px · 1 of 11 slices shown (4 of 9)]
[im 1/11]
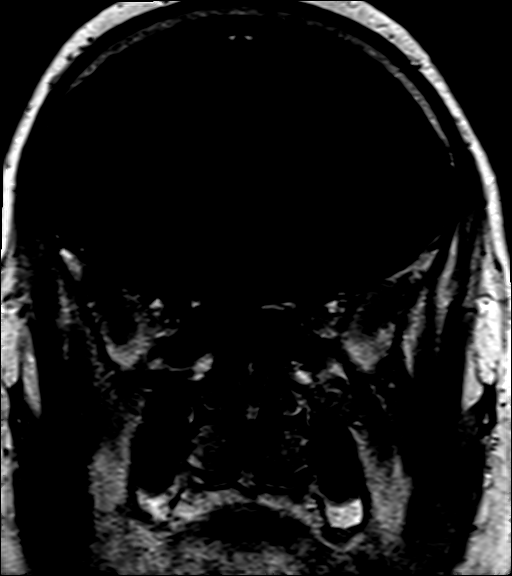

[Series 18: T1 post-contrast · coronal · 3.0mm · 0.28mm/px · 1 of 11 slices shown (5 of 9)]
[im 1/11]
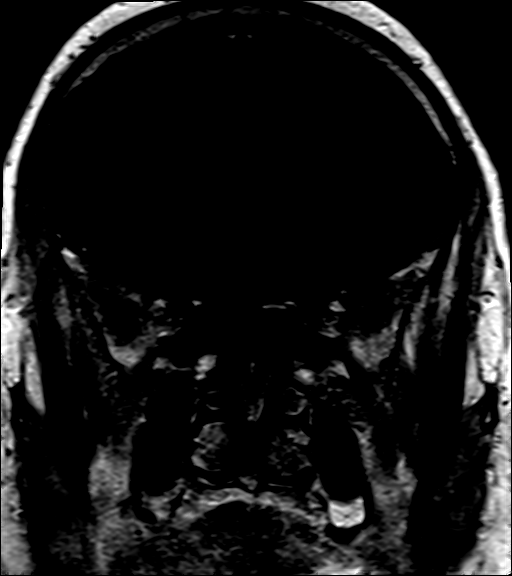

[Series 19: T1 post-contrast · coronal · 3.0mm · 0.21mm/px · 1 of 13 slices shown (6 of 9)]
[im 1/13]
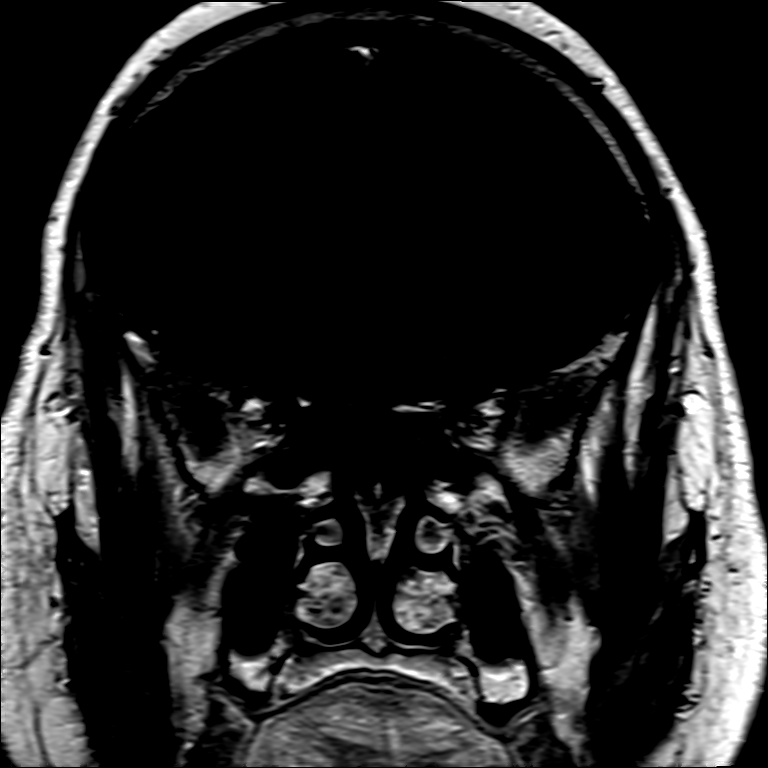

[Series 20: T1 post-contrast · sagittal · 3.0mm · 0.21mm/px · 1 of 13 slices shown (7 of 9)]
[im 1/13]
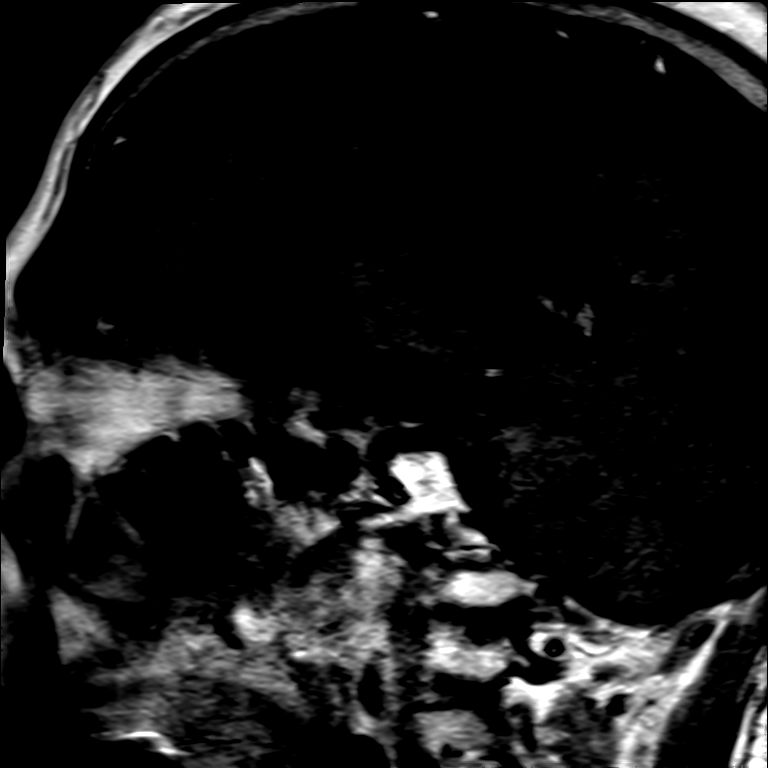

[Series 21: T1 post-contrast · axial · 1.0mm · 0.98mm/px · z∈[-117,+41]mm · 8 of 160 slices shown (8 of 9)]
[im 1/160]
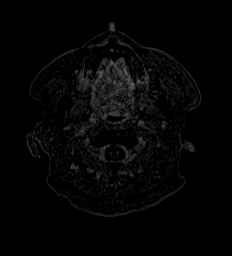
[im 25/160]
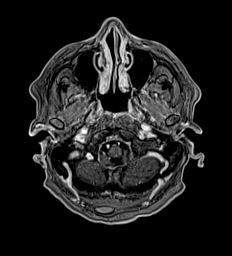
[im 49/160]
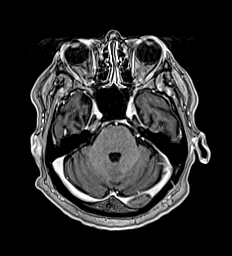
[im 74/160]
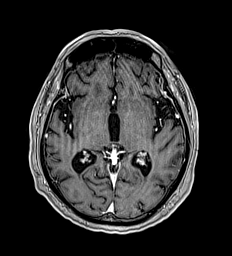
[im 86/160]
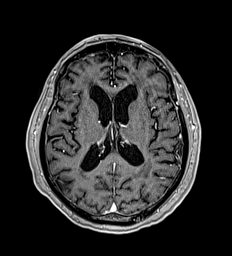
[im 111/160]
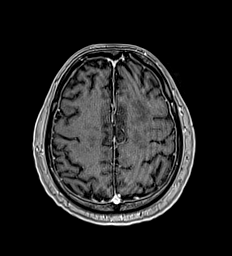
[im 135/160]
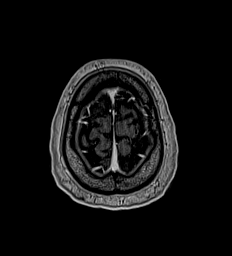
[im 160/160]
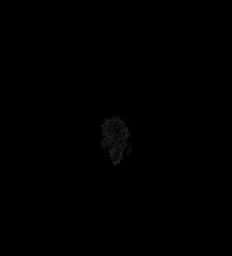

[Series 22: T1 post-contrast · coronal · 5.0mm · 0.57mm/px · 3 of 29 slices shown (9 of 9)]
[im 1/29]
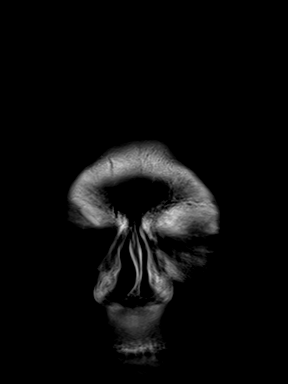
[im 15/29]
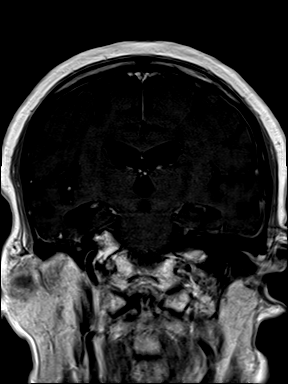
[im 29/29]
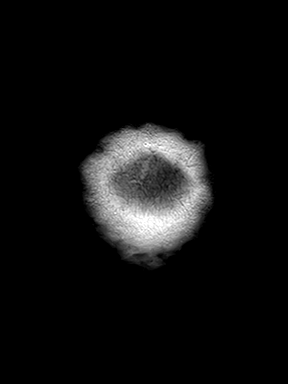

[26 of 48 positions shown; findings below may reference images not displayed]

FINDINGS: Brain: Diffusion imaging remains negative. Generalized age related
atrophy with temporal lobe predominance. No focal abnormality
affecting the brainstem or cerebellum. Cerebral hemispheres show
moderate chronic small-vessel ischemic changes of the deep and
subcortical white matter. No cortical or large vessel territory
infarction. No intra-axial mass lesion, hemorrhage, hydrocephalus or
extra-axial collection. After contrast administration, no abnormal
brain enhancement occurs.

The pituitary gland is enlarged measuring 2 cm right to left, 1 cm
cephalo caudal and 12 mm front to back. There is a pituitary mass
centrally and towards the left measuring approximately 7 x 16 mm,
with extension into the medial cavernous sinuses, more on the left
than the right. Tumor and pituitary tissue contact the under surface
of the optic chiasm but do not appear to cause compression or
displacement.

Vascular: Major vessels at the base of the brain show flow.

Skull and upper cervical spine: Negative

Sinuses/Orbits: Clear/normal

Other: Lipoma of the posterior upper neck as seen previously.
IMPRESSION: 1. Generalized age related atrophy with temporal lobe predominance.
Moderate chronic small-vessel ischemic changes of the cerebral
hemispheric white matter. No change in appearance of the brain since
the study of [DATE].
2. Pituitary mass centrally and towards the left measuring
approximately 7 x 16 mm, with extension into the medial cavernous
sinuses, left more than right. This is consistent with a pituitary
macro adenoma. Pituitary tissue contacts the under surface of the
optic chiasm but does not appear to cause compression or
displacement.

## 2020-01-23 MED ORDER — GADOBUTROL 1 MMOL/ML IV SOLN
9.0000 mL | Freq: Once | INTRAVENOUS | Status: AC | PRN
  Administered 2020-01-23: 9 mL via INTRAVENOUS

## 2021-08-29 ENCOUNTER — Emergency Department: Payer: Medicare HMO

## 2021-08-29 ENCOUNTER — Encounter: Payer: Self-pay | Admitting: Emergency Medicine

## 2021-08-29 ENCOUNTER — Emergency Department
Admission: EM | Admit: 2021-08-29 | Discharge: 2021-08-30 | Disposition: A | Discharge status: Home / Self Care | Payer: Medicare HMO | Attending: Emergency Medicine | Admitting: Emergency Medicine

## 2021-08-29 ENCOUNTER — Other Ambulatory Visit: Payer: Self-pay

## 2021-08-29 DIAGNOSIS — R109 Unspecified abdominal pain: Secondary | ICD-10-CM | POA: Diagnosis present

## 2021-08-29 DIAGNOSIS — I10 Essential (primary) hypertension: Secondary | ICD-10-CM | POA: Diagnosis not present

## 2021-08-29 DIAGNOSIS — D72829 Elevated white blood cell count, unspecified: Secondary | ICD-10-CM | POA: Diagnosis not present

## 2021-08-29 DIAGNOSIS — I2510 Atherosclerotic heart disease of native coronary artery without angina pectoris: Secondary | ICD-10-CM | POA: Diagnosis not present

## 2021-08-29 DIAGNOSIS — K5641 Fecal impaction: Secondary | ICD-10-CM | POA: Diagnosis not present

## 2021-08-29 DIAGNOSIS — F0390 Unspecified dementia without behavioral disturbance: Secondary | ICD-10-CM | POA: Insufficient documentation

## 2021-08-29 DIAGNOSIS — N289 Disorder of kidney and ureter, unspecified: Secondary | ICD-10-CM | POA: Insufficient documentation

## 2021-08-29 LAB — COMPREHENSIVE METABOLIC PANEL
ALT: 14 U/L (ref 0–44)
AST: 35 U/L (ref 15–41)
Albumin: 4.3 g/dL (ref 3.5–5.0)
Alkaline Phosphatase: 85 U/L (ref 38–126)
Anion gap: 9 (ref 5–15)
BUN: 17 mg/dL (ref 8–23)
CO2: 30 mmol/L (ref 22–32)
Calcium: 9.1 mg/dL (ref 8.9–10.3)
Chloride: 102 mmol/L (ref 98–111)
Creatinine, Ser: 1.71 mg/dL — ABNORMAL HIGH (ref 0.61–1.24)
GFR, Estimated: 40 mL/min — ABNORMAL LOW (ref >60)
Glucose, Bld: 116 mg/dL — ABNORMAL HIGH (ref 70–99)
Potassium: 3.1 mmol/L — ABNORMAL LOW (ref 3.5–5.1)
Sodium: 141 mmol/L (ref 135–145)
Total Bilirubin: 1.1 mg/dL (ref 0.3–1.2)
Total Protein: 8.5 g/dL — ABNORMAL HIGH (ref 6.5–8.1)

## 2021-08-29 LAB — CBC
HCT: 44.8 % (ref 39.0–52.0)
Hemoglobin: 14.6 g/dL (ref 13.0–17.0)
MCH: 30.4 pg (ref 26.0–34.0)
MCHC: 32.6 g/dL (ref 30.0–36.0)
MCV: 93.1 fL (ref 80.0–100.0)
Platelets: 155 10*3/uL (ref 150–400)
RBC: 4.81 MIL/uL (ref 4.22–5.81)
RDW: 12.7 % (ref 11.5–15.5)
WBC: 11.2 10*3/uL — ABNORMAL HIGH (ref 4.0–10.5)
nRBC: 0 % (ref 0.0–0.2)

## 2021-08-29 LAB — LIPASE, BLOOD: Lipase: 45 U/L (ref 11–51)

## 2021-08-29 IMAGING — CT CT ABD-PELV W/O CM
2 of 7 series · 15 of 46 positions shown, 17 images · non-contrast
Comparison: [DATE]

CLINICAL DATA: Abdominal pain and nausea.



[Series 2: ap without · axial · non-contrast · 0.85mm/px · z∈[-1207,-692]mm · 12 of 121 slices shown, 14 images]
[im 9/121  soft-tissue]
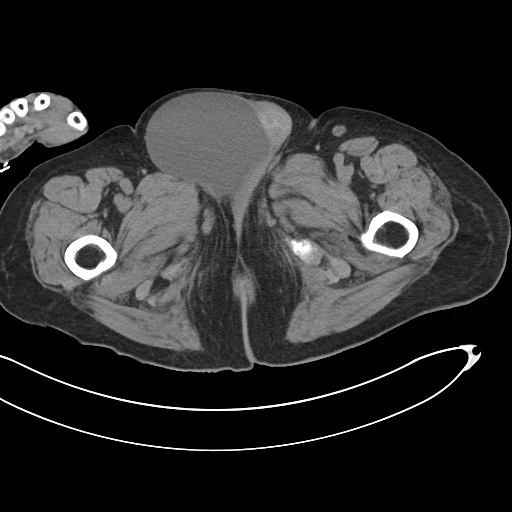
[im 9/121  bone]
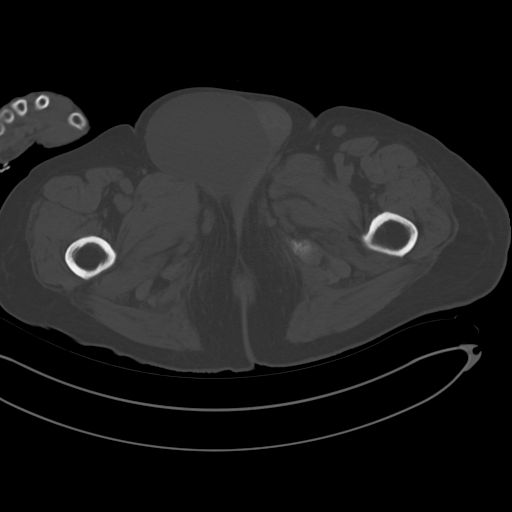
[im 18/121  soft-tissue]
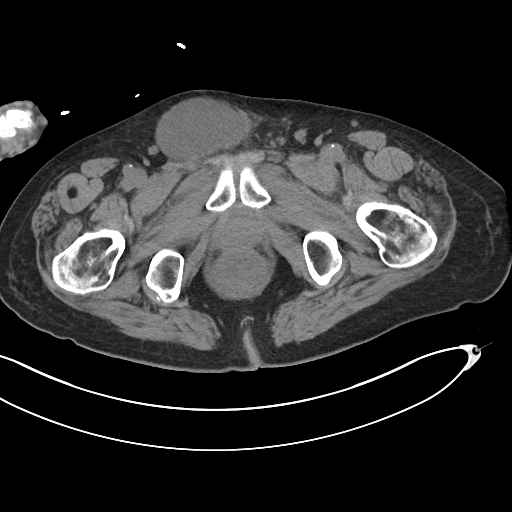
[im 26/121  soft-tissue]
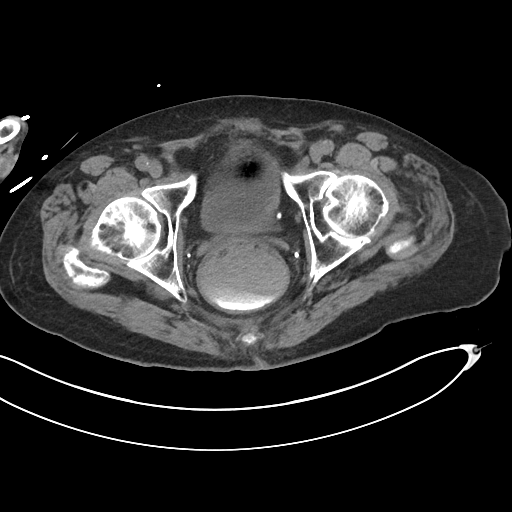
[im 35/121  soft-tissue]
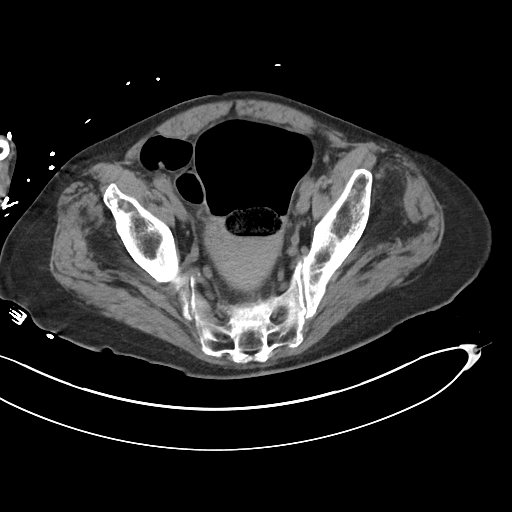
[im 43/121  soft-tissue]
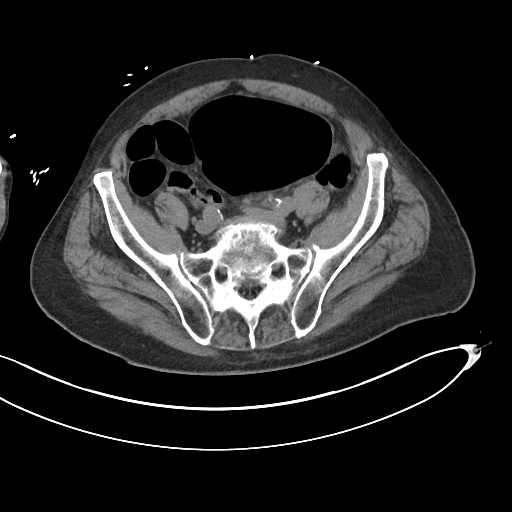
[im 52/121  soft-tissue]
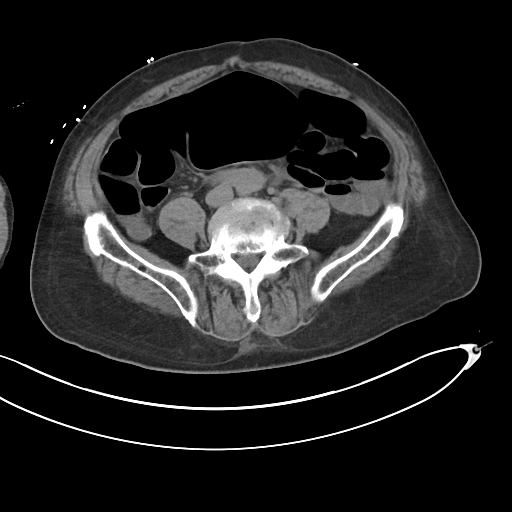
[im 69/121  soft-tissue]
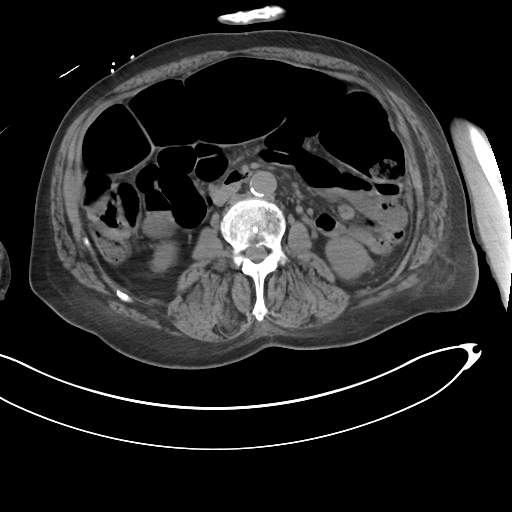
[im 78/121  soft-tissue]
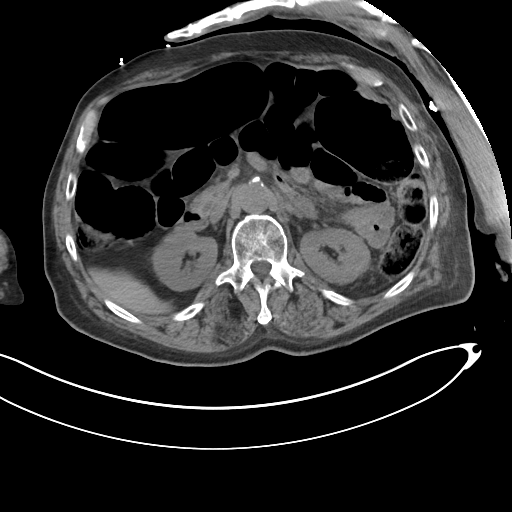
[im 86/121  soft-tissue]
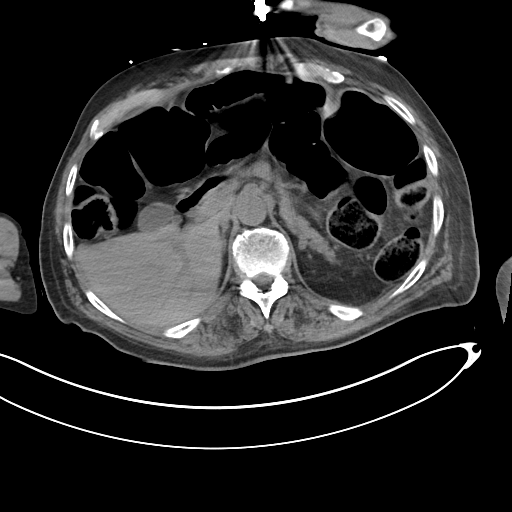
[im 86/121  bone]
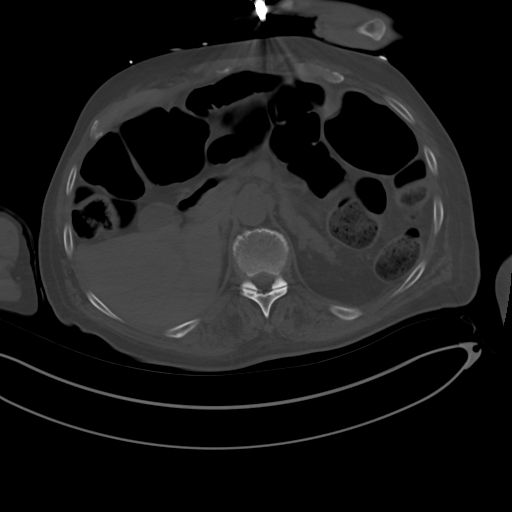
[im 95/121  soft-tissue]
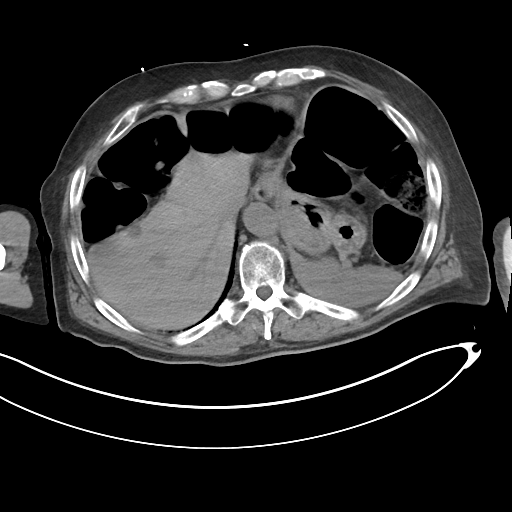
[im 103/121  soft-tissue]
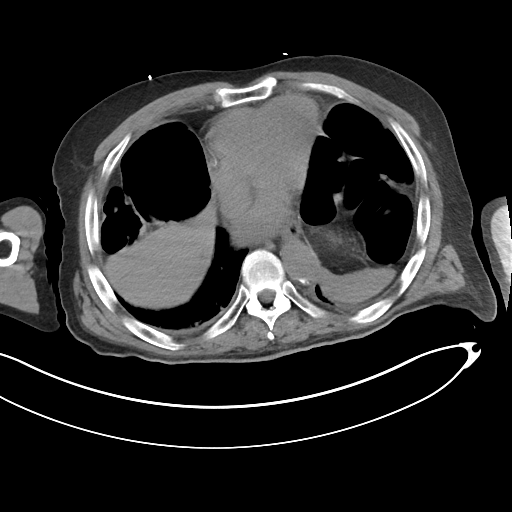
[im 112/121  soft-tissue]
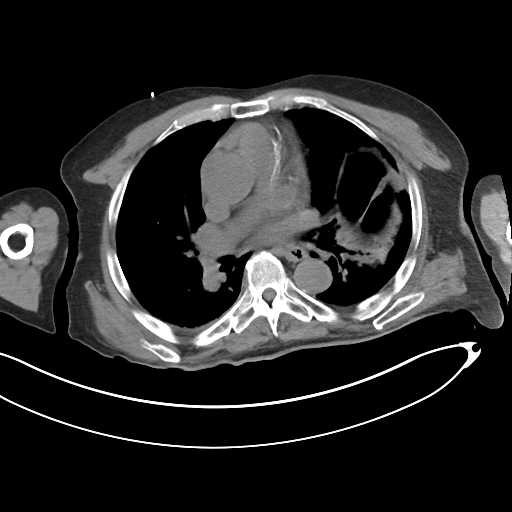

[Series 5: cor · coronal · 0.85mm/px · 3 of 111 slices shown]
[im 23/111  soft-tissue]
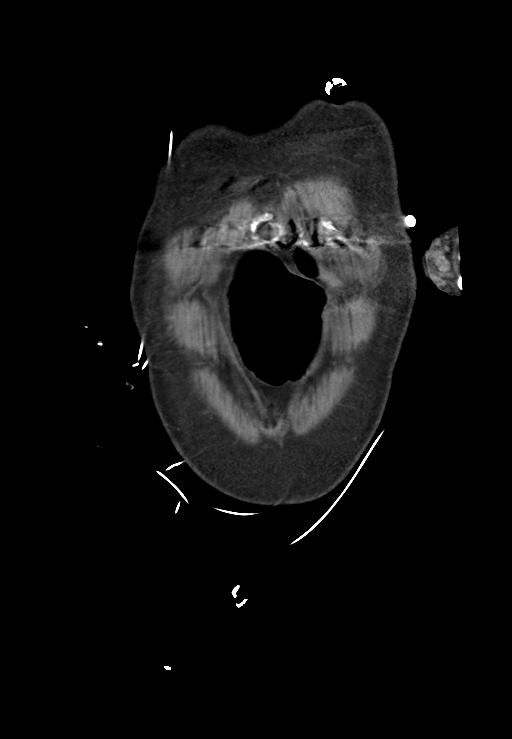
[im 45/111  soft-tissue]
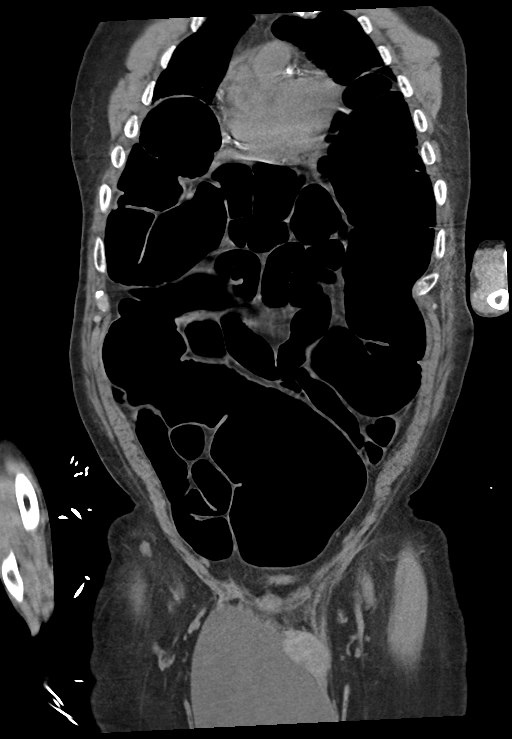
[im 67/111  soft-tissue]
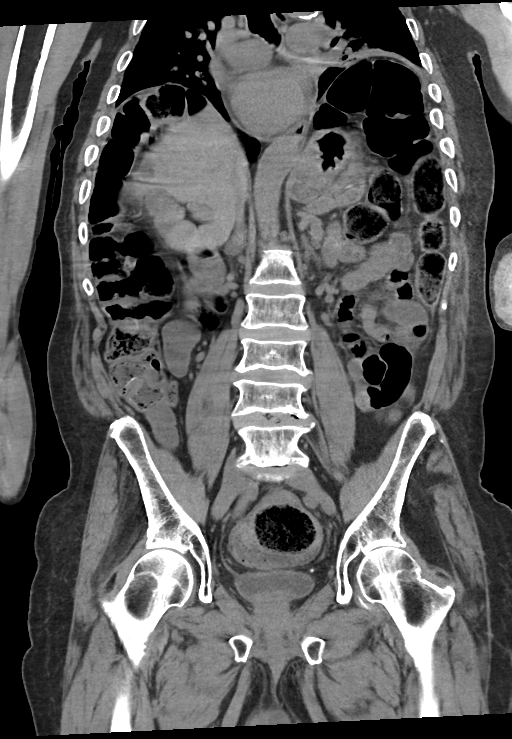

[15 of 46 positions shown; findings below may reference images not displayed]

FINDINGS: Lower chest: Mild to moderate severity atelectasis is seen within
the left lung base.

Hepatobiliary: A 1.4 cm diameter cystic appearing area is seen
within the anterior aspect of the right lobe of the liver. No
gallstones, gallbladder wall thickening, or biliary dilatation.

Pancreas: Unremarkable. No pancreatic ductal dilatation or
surrounding inflammatory changes.

Spleen: Normal in size without focal abnormality.

Adrenals/Urinary Tract: Adrenal glands are unremarkable. Kidneys are
normal in size, without renal calculi or hydronephrosis. A 7 mm cyst
is seen within the lateral aspect of the mid left kidney. No
additional follow-up or imaging is recommended. Bladder is
unremarkable.

Stomach/Bowel: Stomach is within normal limits. The appendix is not
identified the small bowel loops are normal in caliber. There is
diffuse dilatation of predominantly air-filled loops of large bowel.
A moderate amount of stool seen within the distal sigmoid colon and
rectum. A mild amount of stool is also seen within the cecum and
descending colon

Vascular/Lymphatic: Aortic atherosclerosis. No enlarged abdominal or
pelvic lymph nodes.

Reproductive: Prostate is unremarkable.

A large right-sided hydrocele is seen which extends superiorly into
the scrotal canal. This is present on the prior study.

Other: No abdominal wall hernia or abnormality. No abdominopelvic
ascites.

Musculoskeletal: Degenerative changes are seen within the lumbar
spine.
IMPRESSION: 1. Predominantly air-filled distended large bowel which may be
chronic in nature. Sequelae associated with early fecal impaction
cannot be excluded.
2. Large right-sided hydrocele.
3. Aortic atherosclerosis.

Aortic Atherosclerosis ([1X]-[1X]).

## 2021-08-29 NOTE — ED Triage Notes (Signed)
Lab called for blood draw assistance

## 2021-08-29 NOTE — ED Triage Notes (Addendum)
Pt in via EMS from home with c/o SOB. No distress noted per EMS. EMS reports pt is weak and cannot stand. Pt was given 500cc's of NS. 115/78, HR 83. EMS reports was outside upon their arrival. Pt with #18 g to left [facility]

## 2021-08-29 NOTE — ED Provider Notes (Signed)
[facility] Provider Note    Event Date/Time   First MD Initiated Contact with Patient 08/29/21 2049     (approximate)  History   Chief Complaint: Abdominal Pain  HPI  Jiaire Rosebrook is a 81 y.o. male with a past medical history of dementia, CAD, hypertension, presents to the emergency department with complaints of abdominal pain.  According to the wife she has noted of the past few weeks that the patient's dementia has been worsening.  Over the past week or so this is the third time that he has been to a hospital.  States recently he went to the [place] couple days ago to be evaluated.  They state everything from a medical perspective was normal and they believe the patient was suffering from just worsening dementia.  Wife states today patient was in his chair and she could not convince him to go to bed or get him in bed so she called EMS to help get him from the chair into bed.  However while EMS was there the patient was stating some pain in both of his sides.  They recommended that they bring him to the ER for evaluation so the wife agreed.  Wife states patient has advanced dementia and is largely at his baseline here currently.  Having a lot of trouble convincing the patient to get out of the wheelchair into the bed requiring [place] to physically lift the patient into the bed.  She states this is fairly typical as well often times he will require a lot of convincing before he will do what he is asked.  Patient denies any acute pain he is in no distress.  Vital signs are reassuring.  Physical Exam   Triage Vital Signs: ED Triage Vitals [08/29/21 1740]  Enc Vitals Group     BP 112/85     Pulse Rate 89     Resp 18     Temp 98.4 F (36.9 C)     Temp Source Oral     SpO2 98 %     Weight 188 lb (85.3 kg)     Height '5\' 11"'$  (1.803 m)     Head Circumference      Peak Flow      Pain Score      Pain Loc      Pain Edu?      Excl. in [place]?     Most recent vital  signs: Vitals:   08/29/21 1740  BP: 112/85  Pulse: 89  Resp: 18  Temp: 98.4 F (36.9 C)  SpO2: 98%    General: Awake, no distress.  Advanced dementia.  Does not follow commands easily. CV:  Good peripheral perfusion.  Regular rate and rhythm  Resp:  Normal effort.  Equal breath sounds bilaterally.  Abd:  No distention.  Soft, nontender.  No rebound or guarding.    ED Results / Procedures / Treatments   EKG  EKG viewed and interpreted by myself shows a normal sinus rhythm 85 bpm with a slightly widened QRS, normal axis, normal intervals, no concerning ST changes.  RADIOLOGY  CT pending   MEDICATIONS ORDERED IN ED: Medications - No data to display   IMPRESSION / MDM / [place] / ED COURSE  I reviewed the triage vital signs and the nursing notes.  Patient's presentation is most consistent with acute presentation with potential threat to life or bodily function.  Patient presents emergency department for evaluation of possible abdominal pain  as well as worsening dementia.  Wife states the patient is at his baseline currently.  Requires a lot of convincing to try to get the patient to move from the wheelchair to the stretcher.  Wife states earlier he was complaining of abdominal pain although no active complaints currently.  Wife states they have help at home Monday through Friday to help with the patient but limited help on the weekends.  Here the patient appears overall well, appears to have dementia but no acute finding on my examination, vitals are reassuring.  Patient's lab work shows reassuring CBC, mild renal insufficiency this is largely unchanged from historical values.  Given the wife's concern for possible abdominal pain earlier today and given dementia physical exam is not overly reliable.  We will obtain CT imaging of the abdomen/pelvis to further evaluate.  If the CT scan is negative I believe patient could be discharged home from a medical perspective  however this would depend on whether or not the wife was able to adequately care for the patient over the weekend.  Urinalysis pending.  Patient's work-up is reassuring.  Chemistry shows no significant abnormality.  Lipase normal, CBC shows white blood cell count of 11 but otherwise normal.  We will obtain a CT scan to rule out intra abdominal pathology.  Otherwise patient appears well.  I believe much of his symptoms could be due to worsening dementia.  Patient care signed out to oncoming provider.  FINAL CLINICAL IMPRESSION(S) / ED DIAGNOSES   Dementia Abdominal pain   Note:  This document was prepared using Dragon voice recognition software and may include unintentional dictation errors.   Harvest Dark, MD 08/29/21 [phone]

## 2021-08-29 NOTE — ED Notes (Signed)
Pt had to be physically lifted from wheelchair and placed in bed. Pt not understanding the need to stand up and transfer to stretcher. Pt would not let this nurse take temp and IV had to be cobanded.

## 2021-08-29 NOTE — ED Triage Notes (Signed)
Pt via EMS from home. Pt is here with wife, wife states that he has been having abd pain and nausea, wife reports that this is his "third episode" Denies any urinary symptoms. Denies fever. Denies SOB. Pt states that he has some minor chest pain. Pt has a hx of Alzheimer and per wife pt is at his baseline.

## 2021-08-29 NOTE — ED Notes (Signed)
Pt's wife says pt uses his brief to use the restroom. A urinal was provided and asked to try to use. Pt is not cooperative in anyway on monitoring or attempting labs.

## 2021-08-29 NOTE — ED Triage Notes (Signed)
Lab not able to collect specimens, several attempts made

## 2021-08-30 LAB — URINALYSIS, ROUTINE W REFLEX MICROSCOPIC
Bilirubin Urine: NEGATIVE
Glucose, UA: NEGATIVE mg/dL
Hgb urine dipstick: NEGATIVE
Ketones, ur: NEGATIVE mg/dL
Leukocytes,Ua: NEGATIVE
Nitrite: NEGATIVE
Protein, ur: NEGATIVE mg/dL
Specific Gravity, Urine: 1.005 (ref 1.005–1.030)
pH: 7 (ref 5.0–8.0)

## 2021-08-30 MED ORDER — IRBESARTAN 75 MG PO TABS
75.0000 mg | ORAL_TABLET | Freq: Every day | ORAL | Status: DC
  Administered 2021-08-30: 75 mg via ORAL
  Filled 2021-08-30: qty 1

## 2021-08-30 MED ORDER — ADULT MULTIVITAMIN W/MINERALS CH
1.0000 | ORAL_TABLET | Freq: Every day | ORAL | Status: DC
  Administered 2021-08-30: 1 via ORAL
  Filled 2021-08-30: qty 1

## 2021-08-30 MED ORDER — LISINOPRIL 10 MG PO TABS
40.0000 mg | ORAL_TABLET | Freq: Every day | ORAL | Status: DC
  Administered 2021-08-30: 40 mg via ORAL
  Filled 2021-08-30: qty 4

## 2021-08-30 MED ORDER — PRAVASTATIN SODIUM 20 MG PO TABS
80.0000 mg | ORAL_TABLET | Freq: Every day | ORAL | Status: DC
  Administered 2021-08-30: 80 mg via ORAL
  Filled 2021-08-30: qty 4

## 2021-08-30 MED ORDER — ASPIRIN 81 MG PO TBEC
81.0000 mg | DELAYED_RELEASE_TABLET | Freq: Every day | ORAL | Status: DC
  Administered 2021-08-30: 81 mg via ORAL
  Filled 2021-08-30: qty 1

## 2021-08-30 MED ORDER — FINASTERIDE 5 MG PO TABS
5.0000 mg | ORAL_TABLET | Freq: Every day | ORAL | Status: DC
  Administered 2021-08-30: 5 mg via ORAL
  Filled 2021-08-30: qty 1

## 2021-08-30 MED ORDER — DULOXETINE HCL 30 MG PO CPEP
30.0000 mg | ORAL_CAPSULE | Freq: Every day | ORAL | Status: DC
  Administered 2021-08-30: 30 mg via ORAL
  Filled 2021-08-30: qty 1

## 2021-08-30 MED ORDER — METOPROLOL TARTRATE 50 MG PO TABS
50.0000 mg | ORAL_TABLET | Freq: Two times a day (BID) | ORAL | Status: DC
  Administered 2021-08-30: 50 mg via ORAL
  Filled 2021-08-30 (×2): qty 1

## 2021-08-30 MED ORDER — PANTOPRAZOLE SODIUM 20 MG PO TBEC
20.0000 mg | DELAYED_RELEASE_TABLET | Freq: Every day | ORAL | Status: DC
  Administered 2021-08-30: 20 mg via ORAL
  Filled 2021-08-30: qty 1

## 2021-08-30 MED ORDER — CLOPIDOGREL BISULFATE 75 MG PO TABS
75.0000 mg | ORAL_TABLET | Freq: Every day | ORAL | Status: DC
  Administered 2021-08-30: 75 mg via ORAL
  Filled 2021-08-30: qty 1

## 2021-08-30 MED ORDER — RIVASTIGMINE TARTRATE 3 MG PO CAPS
6.0000 mg | ORAL_CAPSULE | Freq: Two times a day (BID) | ORAL | Status: DC
  Administered 2021-08-30: 6 mg via ORAL
  Filled 2021-08-30: qty 2

## 2021-08-30 MED ORDER — AMLODIPINE BESYLATE 5 MG PO TABS
10.0000 mg | ORAL_TABLET | Freq: Every day | ORAL | Status: DC
  Administered 2021-08-30: 10 mg via ORAL
  Filled 2021-08-30: qty 2

## 2021-08-30 MED ORDER — ASCORBIC ACID 500 MG PO TABS
500.0000 mg | ORAL_TABLET | Freq: Every day | ORAL | Status: DC
  Administered 2021-08-30: 500 mg via ORAL
  Filled 2021-08-30: qty 1

## 2021-08-30 NOTE — TOC Progression Note (Addendum)
Transition of Care [facility]) - Progression Note    Patient Details  Name: Syd Newsome MRN: [id number] Date of Birth: 1940-10-31  Transition of Care [facility]) CM/SW [place], [place] Phone Number: 08/30/2021, 1:13 PM  Clinical Narrative:     CSW spoke to patient's family at bedside. The patient was asleep. The family reported not wanting SNF placement at this time but were agreeable to HHPT. Family did not have preference on [facility] agency. CSW informed provider and reached out to [facility] home health. Cheryl from [place] accepted patient for HHPT and nursing.  No further TOC needs at this time.        Expected Discharge Plan and Services                                                 Social Determinants of Health (SDOH) Interventions    Readmission Risk Interventions     View : No data to display.

## 2021-08-30 NOTE — ED Notes (Signed)
Dentures placed at bedside at this time in pink container with pt label

## 2021-08-30 NOTE — ED Provider Notes (Signed)
No UTI, no other abnormalities requiring admission to the hospital.  CT abdomen pelvis with no acute findings.  There was some concern for early fecal impaction however during an attempt to disimpact patient became incontinent of a pretty large volume of diarrhea.  Patient's wife is unable to care for him at home.  Patient has not been able to ambulate steadily at home.  Therefore will place patient in border status, home meds have been ordered and transition care team has been consulted for SNF placement   Alfred Levins, [place], MD 08/30/21 0201

## 2021-08-30 NOTE — ED Notes (Signed)
Pt soiled with urine and stool. Pt is uncooperative when it comes to changing, son at bedside helping to redirect but pt is not easily redirectable. Pt had a full linen change by this RN, Mayra, NT and Terrence Dupont, NT. Bed linen, bed pad, gown, and brief changed. Unable to place external catheter on pt due to enlarged groin scrotum. Pt also repositioned in the bed at this time.

## 2021-08-30 NOTE — TOC Initial Note (Signed)
Transition of Care [facility]) - Initial/Assessment Note    Patient Details  Name: Alex [name] MRN: [id number] Date of Birth: [birth year]  [facility]) CM/SW Contact:    Janyth Contes, [place] Phone Number: 08/30/2021, 9:00 AM  Clinical Narrative:                  [facility] consult for SNF acknowledged. CSW awaiting PT evaluation.        Patient Goals and CMS Choice        Expected Discharge Plan and Services                                                Prior Living Arrangements/Services                       Activities of Daily Living      Permission Sought/Granted                  Emotional Assessment              Admission diagnosis:  Abd pain - EMS Patient Active Problem List   Diagnosis Date Noted   Acute mesenteric ischemia [facility])    Ambulatory dysfunction 11/29/2017   Pneumatosis intestinalis    Perforated gastric ulcer ([place]) 11/21/2017   Chest pain 06/27/2015   Uncontrolled hypertension 06/27/2015   Bradycardia 06/27/2015   Pleural thickening 06/27/2015   PCP:  [place]:   [id number] [place]Phone: [phone]: [phone] [facility] [place]: [phone]: [phone] [id number] [address]: [phone]: [phone]   Social Determinants of Health (SDOH) Interventions    Readmission Risk Interventions     View : No data to display.

## 2021-08-30 NOTE — ED Notes (Signed)
Pt's linens and clothing changed. Pt provided with blankets and urine was able to be collected at that time.

## 2021-08-30 NOTE — ED Notes (Signed)
Pt took night time medication. Pt is resting quietly with lights turned down. Water at pt's side, door open to keep in view

## 2021-08-30 NOTE — ED Provider Notes (Signed)
Patient's family has decided they want to take the patient home would like referral for home health PT.  Orders placed.   Merlyn Lot, MD 08/30/21 1315

## 2021-08-30 NOTE — ED Notes (Signed)
Pt repositioned in the bed at this time for breakfast, this RN sat pt up and assisted him with the set up of his breakfast tray. Denies any needs at this time. Pt is alert but disoriented.

## 2021-10-22 ENCOUNTER — Emergency Department: Payer: Medicare HMO

## 2021-10-22 ENCOUNTER — Other Ambulatory Visit: Payer: Self-pay

## 2021-10-22 ENCOUNTER — Emergency Department
Admission: EM | Admit: 2021-10-22 | Discharge: 2021-10-22 | Disposition: A | Discharge status: Home / Self Care | Payer: Medicare HMO | Attending: Emergency Medicine | Admitting: Emergency Medicine

## 2021-10-22 DIAGNOSIS — F0390 Unspecified dementia without behavioral disturbance: Secondary | ICD-10-CM | POA: Diagnosis not present

## 2021-10-22 DIAGNOSIS — W050XXA Fall from non-moving wheelchair, initial encounter: Secondary | ICD-10-CM | POA: Insufficient documentation

## 2021-10-22 DIAGNOSIS — M545 Low back pain, unspecified: Secondary | ICD-10-CM | POA: Diagnosis not present

## 2021-10-22 DIAGNOSIS — W19XXXA Unspecified fall, initial encounter: Secondary | ICD-10-CM

## 2021-10-22 NOTE — Discharge Instructions (Addendum)
Please use ibuprofen (Motrin) up to 800 mg every 8 hours, naproxen (Naprosyn) up to 500 mg every 12 hours, and/or acetaminophen (Tylenol) up to 4 g/day for any continued pain 

## 2021-10-22 NOTE — ED Notes (Signed)
Family at bedside saying they would like to take patient home. This RN and Darlyne Russian, RN at bedside. Pt extremely confused and hard to redirect patient. Attempted to get patient out of bed pt is refusing and becoming agitated. Explained to family that if he is unable to get up and follow directions to get into the wheelchair that they may be unable to get him into the car and into the house. Told patient that they should continue with EMS transport.  Pt is A&Ox4 and NAD.

## 2021-10-22 NOTE — ED Provider Notes (Signed)
[facility] Provider Note   Event Date/Time   First MD Initiated Contact with Patient 10/22/21 1157     (approximate) History  Fall  HPI Alex [name] is a 81 y.o. male with a past medical history of Alzheimer's dementia who presents after a mechanical fall after sliding out of her wheelchair onto his backside and complaining of lumbar back pain since that time.  Patient is unable to provide full history and review of systems given mental status however EMS states that patient did not have any head trauma or loss of consciousness   Physical Exam  Triage Vital Signs: ED Triage Vitals [10/22/21 1202]  Enc Vitals Group     BP 123/76     Pulse Rate 60     Resp 17     Temp 98.4 F (36.9 C)     Temp Source Oral     SpO2 97 %     Weight 187 lb 6.3 oz (85 kg)     Height '5\' 11"'$  (1.803 m)     Head Circumference      Peak Flow      Pain Score 0     Pain Loc      Pain Edu?      Excl. in [place]?    Most recent vital signs: Vitals:   10/22/21 1202  BP: 123/76  Pulse: 60  Resp: 17  Temp: 98.4 F (36.9 C)  SpO2: 97%   General: Awake, cooperative CV:  Good peripheral perfusion.  Resp:  Normal effort.  Abd:  No distention.  Other:  Elderly the African-American male laying in bed in no acute distress ED Results / Procedures / Treatments  RADIOLOGY ED MD interpretation: X-ray of the lumbar spine does not show any evidence of acute abnormalities.  Interpreted by me -Agree with radiology assessment Official radiology report(s): DG Lumbar Spine Complete  Result Date: 10/22/2021 CLINICAL DATA:  Fall.  Low back pain. EXAM: LUMBAR SPINE - COMPLETE 5 VIEW COMPARISON:  Lumbar spine radiographs 11/30/2017 FINDINGS: Five non rib-bearing lumbar type vertebral bodies are present. Vertebral body heights are maintained. Alignment is stable with grade 1 anterolisthesis at L3-4 and L4-5. No acute fractures are present. Dilated sigmoid colon noted. Vascular calcifications are  noted in the aorta without aneurysm. IMPRESSION: 1. No acute abnormality or significant interval change. 2. Stable grade 1 anterolisthesis at L3-4 and L4-5. 3. Aortic atherosclerosis. 4. Dilated sigmoid colon. Electronically Signed   By: San Morelle M.D.   On: 10/22/2021 13:06   PROCEDURES: Critical Care performed: No Procedures MEDICATIONS ORDERED IN ED: Medications - No data to display IMPRESSION / MDM / [place] / ED COURSE  I reviewed the triage vital signs and the nursing notes.                             The patient is on the cardiac monitor to evaluate for evidence of arrhythmia and/or significant heart rate changes. Patient's presentation is most consistent with acute presentation with potential threat to life or bodily function. Presenting after a fall that occurred just prior to arrival, resulting in injury to the low back. The mechanism of injury was a mechanical ground level fall without syncope or near-syncope. The current level of pain is moderate. There was no loss of consciousness, confusion, seizure, or memory impairment. There is not a laceration associated with the injury. Denies neck pain. The patient does not take  blood thinner medications. Denies vomiting, numbness/weakness, fever  Dispo: Discharge with PCP follow-up   FINAL CLINICAL IMPRESSION(S) / ED DIAGNOSES   Final diagnoses:  Fall, initial encounter  Acute bilateral low back pain without sciatica   Rx / DC Orders   ED Discharge Orders     None      Note:  This document was prepared using Dragon voice recognition software and may include unintentional dictation errors.   Naaman Plummer, MD 10/22/21 1345

## 2021-10-22 NOTE — ED Triage Notes (Signed)
Slid out of wheelchair today. No LOC. Did not hit head. Had complaints of back pain at [facility], but denies pain at this time.

## 2021-12-20 ENCOUNTER — Other Ambulatory Visit: Payer: Self-pay

## 2021-12-20 ENCOUNTER — Inpatient Hospital Stay
Admission: EM | Admit: 2021-12-20 | Discharge: 2021-12-26 | DRG: 392 | Disposition: A | Discharge status: Home Health | Payer: Medicare HMO | Attending: Internal Medicine | Admitting: Internal Medicine

## 2021-12-20 ENCOUNTER — Encounter: Payer: Self-pay | Admitting: Family Medicine

## 2021-12-20 ENCOUNTER — Emergency Department: Payer: Medicare HMO

## 2021-12-20 DIAGNOSIS — F02C18 Dementia in other diseases classified elsewhere, severe, with other behavioral disturbance: Secondary | ICD-10-CM | POA: Diagnosis present

## 2021-12-20 DIAGNOSIS — K5641 Fecal impaction: Secondary | ICD-10-CM | POA: Diagnosis present

## 2021-12-20 DIAGNOSIS — K310 Acute dilatation of stomach: Secondary | ICD-10-CM | POA: Diagnosis not present

## 2021-12-20 DIAGNOSIS — F03918 Unspecified dementia, unspecified severity, with other behavioral disturbance: Secondary | ICD-10-CM | POA: Diagnosis present

## 2021-12-20 DIAGNOSIS — E162 Hypoglycemia, unspecified: Secondary | ICD-10-CM | POA: Diagnosis present

## 2021-12-20 DIAGNOSIS — E440 Moderate protein-calorie malnutrition: Secondary | ICD-10-CM | POA: Diagnosis not present

## 2021-12-20 DIAGNOSIS — F02C3 Dementia in other diseases classified elsewhere, severe, with mood disturbance: Secondary | ICD-10-CM | POA: Diagnosis present

## 2021-12-20 DIAGNOSIS — I110 Hypertensive heart disease with heart failure: Secondary | ICD-10-CM | POA: Diagnosis present

## 2021-12-20 DIAGNOSIS — Z7902 Long term (current) use of antithrombotics/antiplatelets: Secondary | ICD-10-CM | POA: Diagnosis not present

## 2021-12-20 DIAGNOSIS — N400 Enlarged prostate without lower urinary tract symptoms: Secondary | ICD-10-CM | POA: Diagnosis present

## 2021-12-20 DIAGNOSIS — T68XXXA Hypothermia, initial encounter: Secondary | ICD-10-CM | POA: Diagnosis not present

## 2021-12-20 DIAGNOSIS — R109 Unspecified abdominal pain: Secondary | ICD-10-CM | POA: Diagnosis present

## 2021-12-20 DIAGNOSIS — K5981 Ogilvie syndrome: Secondary | ICD-10-CM | POA: Diagnosis present

## 2021-12-20 DIAGNOSIS — E876 Hypokalemia: Secondary | ICD-10-CM | POA: Diagnosis present

## 2021-12-20 DIAGNOSIS — Z515 Encounter for palliative care: Secondary | ICD-10-CM

## 2021-12-20 DIAGNOSIS — I5032 Chronic diastolic (congestive) heart failure: Secondary | ICD-10-CM | POA: Diagnosis present

## 2021-12-20 DIAGNOSIS — F03C11 Unspecified dementia, severe, with agitation: Secondary | ICD-10-CM | POA: Diagnosis not present

## 2021-12-20 DIAGNOSIS — I2510 Atherosclerotic heart disease of native coronary artery without angina pectoris: Secondary | ICD-10-CM | POA: Diagnosis present

## 2021-12-20 DIAGNOSIS — Z8546 Personal history of malignant neoplasm of prostate: Secondary | ICD-10-CM

## 2021-12-20 DIAGNOSIS — R680 Hypothermia, not associated with low environmental temperature: Secondary | ICD-10-CM | POA: Diagnosis present

## 2021-12-20 DIAGNOSIS — R1084 Generalized abdominal pain: Secondary | ICD-10-CM

## 2021-12-20 DIAGNOSIS — E785 Hyperlipidemia, unspecified: Secondary | ICD-10-CM | POA: Diagnosis present

## 2021-12-20 DIAGNOSIS — F0390 Unspecified dementia without behavioral disturbance: Secondary | ICD-10-CM | POA: Diagnosis present

## 2021-12-20 DIAGNOSIS — F02C11 Dementia in other diseases classified elsewhere, severe, with agitation: Secondary | ICD-10-CM | POA: Diagnosis present

## 2021-12-20 DIAGNOSIS — Z955 Presence of coronary angioplasty implant and graft: Secondary | ICD-10-CM | POA: Diagnosis not present

## 2021-12-20 DIAGNOSIS — K6289 Other specified diseases of anus and rectum: Secondary | ICD-10-CM | POA: Diagnosis present

## 2021-12-20 DIAGNOSIS — F0280 Dementia in other diseases classified elsewhere without behavioral disturbance: Secondary | ICD-10-CM | POA: Diagnosis not present

## 2021-12-20 DIAGNOSIS — Z888 Allergy status to other drugs, medicaments and biological substances status: Secondary | ICD-10-CM | POA: Diagnosis not present

## 2021-12-20 DIAGNOSIS — F32A Depression, unspecified: Secondary | ICD-10-CM | POA: Diagnosis present

## 2021-12-20 DIAGNOSIS — I10 Essential (primary) hypertension: Secondary | ICD-10-CM | POA: Diagnosis not present

## 2021-12-20 DIAGNOSIS — G309 Alzheimer's disease, unspecified: Secondary | ICD-10-CM | POA: Diagnosis present

## 2021-12-20 DIAGNOSIS — K5900 Constipation, unspecified: Secondary | ICD-10-CM | POA: Diagnosis not present

## 2021-12-20 DIAGNOSIS — D696 Thrombocytopenia, unspecified: Secondary | ICD-10-CM | POA: Diagnosis present

## 2021-12-20 DIAGNOSIS — Z8249 Family history of ischemic heart disease and other diseases of the circulatory system: Secondary | ICD-10-CM

## 2021-12-20 DIAGNOSIS — Z79899 Other long term (current) drug therapy: Secondary | ICD-10-CM

## 2021-12-20 DIAGNOSIS — Z6824 Body mass index (BMI) 24.0-24.9, adult: Secondary | ICD-10-CM

## 2021-12-20 DIAGNOSIS — Z66 Do not resuscitate: Secondary | ICD-10-CM | POA: Diagnosis present

## 2021-12-20 DIAGNOSIS — Z7982 Long term (current) use of aspirin: Secondary | ICD-10-CM

## 2021-12-20 DIAGNOSIS — Z880 Allergy status to penicillin: Secondary | ICD-10-CM

## 2021-12-20 LAB — CBC WITH DIFFERENTIAL/PLATELET
Abs Immature Granulocytes: 0.01 10*3/uL (ref 0.00–0.07)
Basophils Absolute: 0 10*3/uL (ref 0.0–0.1)
Basophils Relative: 1 %
Eosinophils Absolute: 0.5 10*3/uL (ref 0.0–0.5)
Eosinophils Relative: 7 %
HCT: 42.4 % (ref 39.0–52.0)
Hemoglobin: 14 g/dL (ref 13.0–17.0)
Immature Granulocytes: 0 %
Lymphocytes Relative: 23 %
Lymphs Abs: 1.8 10*3/uL (ref 0.7–4.0)
MCH: 30 pg (ref 26.0–34.0)
MCHC: 33 g/dL (ref 30.0–36.0)
MCV: 90.8 fL (ref 80.0–100.0)
Monocytes Absolute: 0.8 10*3/uL (ref 0.1–1.0)
Monocytes Relative: 10 %
Neutro Abs: 4.7 10*3/uL (ref 1.7–7.7)
Neutrophils Relative %: 59 %
Platelets: 173 10*3/uL (ref 150–400)
RBC: 4.67 MIL/uL (ref 4.22–5.81)
RDW: 13.9 % (ref 11.5–15.5)
WBC: 7.8 10*3/uL (ref 4.0–10.5)
nRBC: 0 % (ref 0.0–0.2)

## 2021-12-20 LAB — MAGNESIUM: Magnesium: 2 mg/dL (ref 1.7–2.4)

## 2021-12-20 LAB — COMPREHENSIVE METABOLIC PANEL
ALT: 8 U/L (ref 0–44)
AST: 18 U/L (ref 15–41)
Albumin: 4 g/dL (ref 3.5–5.0)
Alkaline Phosphatase: 78 U/L (ref 38–126)
Anion gap: 9 (ref 5–15)
BUN: 18 mg/dL (ref 8–23)
CO2: 29 mmol/L (ref 22–32)
Calcium: 9.2 mg/dL (ref 8.9–10.3)
Chloride: 105 mmol/L (ref 98–111)
Creatinine, Ser: 1.14 mg/dL (ref 0.61–1.24)
GFR, Estimated: >60 mL/min (ref >60)
Glucose, Bld: 117 mg/dL — ABNORMAL HIGH (ref 70–99)
Potassium: 2.8 mmol/L — ABNORMAL LOW (ref 3.5–5.1)
Sodium: 143 mmol/L (ref 135–145)
Total Bilirubin: 0.7 mg/dL (ref 0.3–1.2)
Total Protein: 7.4 g/dL (ref 6.5–8.1)

## 2021-12-20 LAB — PROTIME-INR
INR: 1.1 (ref 0.8–1.2)
Prothrombin Time: 14.1 seconds (ref 11.4–15.2)

## 2021-12-20 LAB — BRAIN NATRIURETIC PEPTIDE: B Natriuretic Peptide: 55.8 pg/mL (ref 0.0–100.0)

## 2021-12-20 LAB — LACTIC ACID, PLASMA: Lactic Acid, Venous: 1.6 mmol/L (ref 0.5–1.9)

## 2021-12-20 LAB — APTT: aPTT: 27 seconds (ref 24–36)

## 2021-12-20 LAB — LIPASE, BLOOD: Lipase: 41 U/L (ref 11–51)

## 2021-12-20 LAB — PROCALCITONIN: Procalcitonin: <0.1 ng/mL

## 2021-12-20 MED ORDER — ASPIRIN 81 MG PO TBEC
81.0000 mg | DELAYED_RELEASE_TABLET | Freq: Every day | ORAL | Status: DC
  Administered 2021-12-20 – 2021-12-26 (×7): 81 mg via ORAL
  Filled 2021-12-20 (×7): qty 1

## 2021-12-20 MED ORDER — FLEET ENEMA 7-19 GM/118ML RE ENEM
1.0000 | ENEMA | Freq: Once | RECTAL | Status: AC
  Administered 2021-12-20: 1 via RECTAL

## 2021-12-20 MED ORDER — FINASTERIDE 5 MG PO TABS
5.0000 mg | ORAL_TABLET | Freq: Every day | ORAL | Status: DC
  Administered 2021-12-20 – 2021-12-26 (×7): 5 mg via ORAL
  Filled 2021-12-20 (×7): qty 1

## 2021-12-20 MED ORDER — POTASSIUM CHLORIDE CRYS ER 20 MEQ PO TBCR
40.0000 meq | EXTENDED_RELEASE_TABLET | Freq: Once | ORAL | Status: AC
  Administered 2021-12-20: 40 meq via ORAL
  Filled 2021-12-20: qty 2

## 2021-12-20 MED ORDER — ADULT MULTIVITAMIN W/MINERALS CH
1.0000 | ORAL_TABLET | Freq: Every day | ORAL | Status: DC
  Administered 2021-12-20 – 2021-12-26 (×7): 1 via ORAL
  Filled 2021-12-20 (×7): qty 1

## 2021-12-20 MED ORDER — MELATONIN 5 MG PO TABS
5.0000 mg | ORAL_TABLET | Freq: Every day | ORAL | Status: DC
  Administered 2021-12-20 – 2021-12-25 (×6): 5 mg via ORAL
  Filled 2021-12-20 (×6): qty 1

## 2021-12-20 MED ORDER — PANTOPRAZOLE SODIUM 20 MG PO TBEC
20.0000 mg | DELAYED_RELEASE_TABLET | Freq: Every day | ORAL | Status: DC
  Administered 2021-12-20 – 2021-12-26 (×7): 20 mg via ORAL
  Filled 2021-12-20 (×7): qty 1

## 2021-12-20 MED ORDER — SODIUM CHLORIDE 0.9 % IV SOLN
1.0000 g | INTRAVENOUS | Status: AC
  Administered 2021-12-20: 1 g via INTRAVENOUS
  Filled 2021-12-20: qty 10

## 2021-12-20 MED ORDER — HEPARIN SODIUM (PORCINE) 5000 UNIT/ML IJ SOLN
5000.0000 [IU] | Freq: Three times a day (TID) | INTRAMUSCULAR | Status: DC
  Administered 2021-12-20 – 2021-12-26 (×18): 5000 [IU] via SUBCUTANEOUS
  Filled 2021-12-20 (×14): qty 1

## 2021-12-20 MED ORDER — SODIUM CHLORIDE 0.9 % IV SOLN: INTRAVENOUS | Status: DC

## 2021-12-20 MED ORDER — PRAVASTATIN SODIUM 20 MG PO TABS
80.0000 mg | ORAL_TABLET | Freq: Every day | ORAL | Status: DC
  Administered 2021-12-21 – 2021-12-26 (×6): 80 mg via ORAL
  Filled 2021-12-20 (×6): qty 4

## 2021-12-20 MED ORDER — ONDANSETRON HCL 4 MG/2ML IJ SOLN
4.0000 mg | INTRAMUSCULAR | Status: AC
  Administered 2021-12-20: 4 mg via INTRAVENOUS
  Filled 2021-12-20: qty 2

## 2021-12-20 MED ORDER — HYDRALAZINE HCL 20 MG/ML IJ SOLN: 5.0000 mg | INTRAMUSCULAR | Status: DC | PRN

## 2021-12-20 MED ORDER — VITAMIN B-12 1000 MCG PO TABS
1000.0000 ug | ORAL_TABLET | Freq: Every day | ORAL | Status: DC
  Administered 2021-12-20 – 2021-12-26 (×7): 1000 ug via ORAL
  Filled 2021-12-20 (×7): qty 1

## 2021-12-20 MED ORDER — AMLODIPINE BESYLATE 5 MG PO TABS
5.0000 mg | ORAL_TABLET | Freq: Every day | ORAL | Status: DC
  Administered 2021-12-20 – 2021-12-26 (×7): 5 mg via ORAL
  Filled 2021-12-20 (×8): qty 1

## 2021-12-20 MED ORDER — ACETAMINOPHEN 325 MG PO TABS
650.0000 mg | ORAL_TABLET | Freq: Four times a day (QID) | ORAL | Status: DC | PRN
  Administered 2021-12-24 – 2021-12-25 (×2): 650 mg via ORAL
  Filled 2021-12-20 (×2): qty 2

## 2021-12-20 MED ORDER — METRONIDAZOLE 500 MG/100ML IV SOLN
500.0000 mg | Freq: Once | INTRAVENOUS | Status: AC
  Administered 2021-12-20: 500 mg via INTRAVENOUS
  Filled 2021-12-20: qty 100

## 2021-12-20 MED ORDER — DULOXETINE HCL 30 MG PO CPEP
30.0000 mg | ORAL_CAPSULE | Freq: Every day | ORAL | Status: DC
  Administered 2021-12-20 – 2021-12-26 (×7): 30 mg via ORAL
  Filled 2021-12-20 (×7): qty 1

## 2021-12-20 MED ORDER — VITAMIN C 500 MG PO TABS
500.0000 mg | ORAL_TABLET | Freq: Every day | ORAL | Status: DC
  Administered 2021-12-20 – 2021-12-26 (×7): 500 mg via ORAL
  Filled 2021-12-20 (×7): qty 1

## 2021-12-20 MED ORDER — DIPHENHYDRAMINE HCL 50 MG/ML IJ SOLN: 12.5000 mg | Freq: Three times a day (TID) | INTRAMUSCULAR | Status: DC | PRN

## 2021-12-20 MED ORDER — SODIUM CHLORIDE 0.9 % IV SOLN
2.0000 g | INTRAVENOUS | Status: DC
  Filled 2021-12-20: qty 20

## 2021-12-20 MED ORDER — METRONIDAZOLE 500 MG/100ML IV SOLN
500.0000 mg | Freq: Two times a day (BID) | INTRAVENOUS | Status: DC
  Administered 2021-12-20 – 2021-12-26 (×12): 500 mg via INTRAVENOUS
  Filled 2021-12-20 (×13): qty 100

## 2021-12-20 MED ORDER — SODIUM CHLORIDE 0.9 % IV SOLN
1.0000 g | Freq: Once | INTRAVENOUS | Status: AC
  Administered 2021-12-20: 1 g via INTRAVENOUS
  Filled 2021-12-20: qty 10

## 2021-12-20 MED ORDER — SODIUM CHLORIDE 0.9 % IV SOLN
2.0000 g | INTRAVENOUS | Status: DC
  Administered 2021-12-21 – 2021-12-25 (×5): 2 g via INTRAVENOUS
  Filled 2021-12-20: qty 2
  Filled 2021-12-20: qty 20
  Filled 2021-12-20 (×2): qty 2
  Filled 2021-12-20: qty 20
  Filled 2021-12-20: qty 2

## 2021-12-20 MED ORDER — IOHEXOL 300 MG/ML  SOLN
100.0000 mL | Freq: Once | INTRAMUSCULAR | Status: AC | PRN
  Administered 2021-12-20: 100 mL via INTRAVENOUS

## 2021-12-20 NOTE — Assessment & Plan Note (Addendum)
Pravastatin  

## 2021-12-20 NOTE — Assessment & Plan Note (Signed)
Potassium 2.8.  Magnesium 2.0 -Repleted potassium

## 2021-12-20 NOTE — Consult Note (Signed)
Patient ID: Alex [name], male   DOB: 01/10/1941, 81 y.o.   MRN: [id number]  HPI Alex [name] is a 81 y.o. male seen in consultation at the request of Dr. Blaine Hamper for colonic distention.  He presented today with diarrhea abdominal pain.  EMS was called.  Patient has a significant history of dementia that is advanced.  When I examined him and went to the room the patient had no complaints.  He answer very simple questions and was very unreliable due to his severe dementia.  Currently he liveswith the wife but requires a lot of care.  I did talk to the caregiver today she tells me that he is pretty much total care.  He is unable to feed himself and he has to be cleaned and fed every day.  He has limited mobility. He did have a history of inguinal hernia repair in 2016.  He does have significant coronary artery disease and is on Plavix CT scan personally reviewed showing distention of the  sigmoid colon some stool rectal vault. His lab work does show evidence of hypokalemia 2.8, rest of the CMP is normal.  CBC is normal HPI  Past Medical History:  Diagnosis Date   Alzheimer's dementia ([place])    BPH (benign prostatic hyperplasia)    CAD (coronary artery disease)    Cancer ([place])    Prostate Cancer per wife   Dementia ([place])    Hypertension     Past Surgical History:  Procedure Laterality Date   CORONARY ANGIOPLASTY WITH STENT PLACEMENT     HERNIA REPAIR      Family History  Problem Relation Age of Onset   Hypertension Mother     Social History Social History   Tobacco Use   Smoking status: Never   Smokeless tobacco: Never  Vaping Use   Vaping Use: Never used  Substance Use Topics   Alcohol use: Yes    Comment: rarely   Drug use: No    Allergies  Allergen Reactions   Donepezil Diarrhea   Penicillins Other (See Comments) and Itching    Unknown reaction, childhood  Has patient had a PCN reaction causing immediate rash, facial/tongue/throat swelling, SOB or lightheadedness with  hypotension: Yes Has patient had a PCN reaction causing severe rash involving mucus membranes or skin necrosis: No Has patient had a PCN reaction that required hospitalization: No Has patient had a PCN reaction occurring within the last 10 years: No If all of the above answers are "NO", then may proceed with Cephalosporin use.     Current Facility-Administered Medications  Medication Dose Route Frequency Provider Last Rate Last Admin   0.9 %  sodium chloride infusion   Intravenous Continuous Ivor Costa, MD 75 mL/hr at 12/20/21 0912 New Bag at 12/20/21 0912   acetaminophen (TYLENOL) tablet 650 mg  650 mg Oral Q6H PRN Ivor Costa, MD       cefTRIAXone (ROCEPHIN) 1 g in sodium chloride 0.9 % 100 mL IVPB  1 g Intravenous Once Ivor Costa, MD       [START ON 12/21/2021] cefTRIAXone (ROCEPHIN) 2 g in sodium chloride 0.9 % 100 mL IVPB  2 g Intravenous Q24H Vira Blanco, RPH       diphenhydrAMINE (BENADRYL) injection 12.5 mg  12.5 mg Intravenous Q8H PRN Ivor Costa, MD       heparin injection 5,000 Units  5,000 Units Subcutaneous Q8H Ivor Costa, MD       hydrALAZINE (APRESOLINE) injection 5 mg  5 mg Intravenous  Q2H PRN Ivor Costa, MD       metroNIDAZOLE (FLAGYL) IVPB 500 mg  500 mg Intravenous Q12H Ivor Costa, MD       sodium phosphate (FLEET) 7-19 GM/118ML enema 1 enema  1 enema Rectal Once Audrionna Lampton F, MD         Review of Systems ROS  unable to be obtained due to alzheimer's  Physical Exam Blood pressure (!) 155/96, pulse 71, temperature (!) 97.4 F (36.3 C), temperature source Oral, resp. rate 16, height 6' (1.829 m), weight 80.6 kg, SpO2 99 %. CONSTITUTIONAL: NAD, answers very simple questions. EYES: Pupils are equal, round,  Sclera are non-icteric. EARS, NOSE, MOUTH AND THROAT: The oral mucosa is pink and moist. Hearing is intact to voice. LYMPH NODES:  Lymph nodes in the neck are normal. RESPIRATORY:  Lungs are clear. There is normal respiratory effort, with equal breath sounds  bilaterally, and without pathologic use of accessory muscles. CARDIOVASCULAR: Heart is regular without murmurs, gallops, or rubs. GI: The abdomen is  soft, nontender, mildly distended There are no palpable masses. There is no hepatosplenomegaly. There are decrease bowel sounds MUSCULOSKELETAL: Normal muscle strength and tone. No cyanosis or edema.   SKIN: Turgor is good and there are no pathologic skin lesions or ulcers. NEUROLOGIC: Motor and sensation is grossly normal. Cranial nerves are grossly intact. PSYCH:  Oriented to person, place and time. Affect is normal.  Data Reviewed  I have personally reviewed the patient's imaging, laboratory findings and medical records.    Assessment/Plan 81 year old male with colonic distention questionable Ogilvie's.  I am not sure if this is related to potential fecal impaction versus electrolyte abnormalities..  Discussed with nursing about fecal disimpaction.  Also recommend potassium repletion and hydration. I do not think that he will be a good surgical candidate.  Given his advanced dementia and clinical worsening over the last few months recommend discussion of goals of care with family and palliative consult. He is currently not toxic and does not need any surgical intervention at this time. We will follow   Caroleen Hamman, MD FACS General Surgeon 12/20/2021, 12:47 PM

## 2021-12-20 NOTE — H&P (Signed)
I Actos 45 mg [place]  Family friends daughter getting married that is the major reason for V6 continue weekly very very healthy  History and Physical    Alex [name] ONG:[id number] DOB: [birth year] DOA: 12/20/2021  Referring MD/NP/PA:   PCP: [facility] [place]   Patient coming from:  The patient is coming from home per his wife   Chief Complaint: abdominal pain  HPI: Alex [name] is a 81 y.o. male with medical history significant of perforated gastric ulcer, mesenteric ischemia, hypertension, hyperlipidemia, GERD, depression, dementia, prostate cancer, BPH, CAD with stent placement, dCHF, who presents with abdominal pain.    Pt has dementia, and cannot provide accurate medical history.  I called her wife by phone.  Per his wife, patient developed abdominal pain since yesterday, which seem to be diffuse abdominal pain.  Also has 1 time of loose stool bowel movement.  No nausea and vomiting.  Patient denies chest pain, cough, shortness breath.  Denies symptoms of UTI.  Per his wife, at her normal baseline, patient sometimes recognizes family members, but most of the time he is not orientated to the place and time.  Patient seems to be more confused in the past several days per his wife.  Patient is less reactive than baseline.  Data reviewed independently and ED Course: pt was found to have WBC 7.8, lactic acid 1.6, BNP 55.8, potassium 2.8, magnesium 2.0, GFR> 60, temperature 98.1, 96.0, blood pressure 169/85, heart rate 73, RR 16.  Patient is admitted to [facility] [place] bed as inpatient.  Dr. Dahlia Byes of surgery is consulted.  EKG: I have personally reviewed.  Sinus rhythm, QTc 547, Q wave in lead III, early R wave progression, nonspecific T wave change  CT-abd/pelvis showed: Increased marked distention of the large bowel greatest about the sigmoid colon when compared with 08/29/2021. There is hyperdense layering stool within the rectum. Fecal impaction can not be excluded.   Wall  thickening about the rectum suggestive of proctitis.   Large right-sided hydrocele.   Aortic Atherosclerosis (ICD10-I70.0).   Review of Systems: Could not be reviewed due to dementia.   Allergy:  Allergies  Allergen Reactions   Donepezil Diarrhea   Penicillins Other (See Comments) and Itching    Unknown reaction, childhood  Has patient had a PCN reaction causing immediate rash, facial/tongue/throat swelling, SOB or lightheadedness with hypotension: Yes Has patient had a PCN reaction causing severe rash involving mucus membranes or skin necrosis: No Has patient had a PCN reaction that required hospitalization: No Has patient had a PCN reaction occurring within the last 10 years: No If all of the above answers are "NO", then may proceed with Cephalosporin use.    Penicillin G Rash    Past Medical History:  Diagnosis Date   Alzheimer's dementia ([place])    BPH (benign prostatic hyperplasia)    CAD (coronary artery disease)    Cancer ([place])    Prostate Cancer per wife   Dementia ([place])    Hypertension     Past Surgical History:  Procedure Laterality Date   CORONARY ANGIOPLASTY WITH STENT PLACEMENT     HERNIA REPAIR      Social History:  reports that he has never smoked. He has never used smokeless tobacco. He reports current alcohol use. He reports that he does not use drugs.  Family History:  Family History  Problem Relation Age of Onset   Hypertension Mother      Prior to Admission medications   Medication Sig Start Date End  Date Taking? Authorizing Provider  amLODipine (NORVASC) 5 MG tablet Take 10 mg by mouth daily.    [provider]  ascorbic acid (VITAMIN C) 500 MG tablet Take 500 mg by mouth daily.    [provider]  aspirin EC 81 MG tablet Take 81 mg by mouth daily.    [provider]  clopidogrel (PLAVIX) 75 MG tablet Take 75 mg by mouth daily.    [provider]  DULoxetine (CYMBALTA) 30 MG capsule Take 1 capsule (30 mg  total) by mouth daily. 12/01/17 08/29/21  Salary, Avel Peace, MD  finasteride (PROSCAR) 5 MG tablet Take 1 tablet (5 mg total) by mouth daily. 06/29/15   Loletha Grayer, MD  lisinopril (PRINIVIL,ZESTRIL) 40 MG tablet Take 40 mg by mouth daily.    [provider]  metoprolol tartrate (LOPRESSOR) 50 MG tablet Take 50 mg by mouth 2 (two) times daily.    [provider]  Multiple Vitamin (MULTIVITAMIN WITH MINERALS) TABS tablet Take 1 tablet by mouth daily.    [provider]  pantoprazole (PROTONIX) 20 MG tablet Take 20 mg by mouth daily.    [provider]  pravastatin (PRAVACHOL) 80 MG tablet Take 80 mg by mouth daily.    [provider]  rivastigmine (EXELON) 6 MG capsule Take 6 mg by mouth 2 (two) times daily.    [provider]  sildenafil (VIAGRA) 100 MG tablet Take 50 mg by mouth daily as needed for erectile dysfunction.    [provider]  Skin Protectants, Misc. (EUCERIN) cream Apply 1 application. topically as needed for dry skin.    [provider]  sodium chloride (OCEAN) 0.65 % SOLN nasal spray Place 1 spray into both nostrils as needed for congestion. 12/02/17   Salary, Avel Peace, MD  valsartan (DIOVAN) 80 MG tablet Take 80 mg by mouth daily.    [provider]    Physical Exam: Vitals:   12/20/21 0545 12/20/21 0640 12/20/21 0921 12/20/21 1642  BP:  (!) 169/85 (!) 155/96 (!) 144/98  Pulse: 64 64 71 78  Resp: '17 16  18  '$ Temp:  (!) 96 F (35.6 C) (!) 97.4 F (36.3 C) 97.7 F (36.5 C)  TempSrc:  Axillary Oral   SpO2: 98% 100% 99% 97%  Weight:  80.6 kg    Height:  6' (1.829 m)     General: Not in acute distress HEENT:       Eyes: PERRL, EOMI, no scleral icterus.       ENT: No discharge from the ears and nose       Neck: No JVD, no bruit, no mass felt. Heme: No neck lymph node enlargement. Cardiac: S1/S2, RRR, No murmurs, No gallops or rubs. Respiratory: No rales, wheezing, rhonchi or rubs. GI: mildly  distended, diffuse abdominal tenderness,  no organomegaly, BS present. GU: has a large right-sided hydrocele Ext: No pitting leg edema bilaterally. 1+DP/PT pulse bilaterally. Musculoskeletal: No joint deformities, No joint redness or warmth, no limitation of ROM in spin. Skin: No rashes.  Neuro: Alert, not oriented X3, partially following command, cranial nerves II-XII grossly intact, moves all extremities  Psych: Patient is not psychotic, no suicidal or hemocidal ideation.  Labs on Admission: I have personally reviewed following labs and imaging studies  CBC: Recent Labs  Lab 12/20/21 0015  WBC 7.8  NEUTROABS 4.7  HGB 14.0  HCT 42.4  MCV 90.8  PLT 431   Basic Metabolic Panel: Recent Labs  Lab 12/20/21  0015  NA 143  K 2.8*  CL 105  CO2 29  GLUCOSE 117*  BUN 18  CREATININE 1.14  CALCIUM 9.2  MG 2.0   GFR: Estimated Creatinine Clearance: 55.8 mL/min (by C-G formula based on SCr of 1.14 mg/dL). Liver Function Tests: Recent Labs  Lab 12/20/21 0015  AST 18  ALT 8  ALKPHOS 78  BILITOT 0.7  PROT 7.4  ALBUMIN 4.0   Recent Labs  Lab 12/20/21 0015  LIPASE 41   No results for input(s): "AMMONIA" in the last 168 hours. Coagulation Profile: Recent Labs  Lab 12/20/21 0859  INR 1.1   Cardiac Enzymes: No results for input(s): "CKTOTAL", "CKMB", "CKMBINDEX", "TROPONINI" in the last 168 hours. BNP (last 3 results) No results for input(s): "PROBNP" in the last 8760 hours. HbA1C: No results for input(s): "HGBA1C" in the last 72 hours. CBG: No results for input(s): "GLUCAP" in the last 168 hours. Lipid Profile: No results for input(s): "CHOL", "HDL", "LDLCALC", "TRIG", "CHOLHDL", "LDLDIRECT" in the last 72 hours. Thyroid Function Tests: No results for input(s): "TSH", "T4TOTAL", "FREET4", "T3FREE", "THYROIDAB" in the last 72 hours. Anemia Panel: No results for input(s): "VITAMINB12", "FOLATE", "FERRITIN", "TIBC", "IRON", "RETICCTPCT" in the last 72 hours. Urine  analysis:    Component Value Date/Time   COLORURINE STRAW (A) 08/30/2021 0120   APPEARANCEUR HAZY (A) 08/30/2021 0120   LABSPEC 1.005 08/30/2021 0120   PHURINE 7.0 08/30/2021 0120   GLUCOSEU NEGATIVE 08/30/2021 0120   HGBUR NEGATIVE 08/30/2021 0120   BILIRUBINUR NEGATIVE 08/30/2021 0120   KETONESUR NEGATIVE 08/30/2021 0120   PROTEINUR NEGATIVE 08/30/2021 0120   NITRITE NEGATIVE 08/30/2021 0120   LEUKOCYTESUR NEGATIVE 08/30/2021 0120   Sepsis Labs: '@LABRCNTIP'$ (procalcitonin:4,lacticidven:4) )No results found for this or any previous visit (from the past 240 hour(s)).   Radiological Exams on Admission: CT ABDOMEN PELVIS W CONTRAST  Result Date: 12/20/2021 CLINICAL DATA:  Diarrhea, abdominal pain, and altered mental status EXAM: CT ABDOMEN AND PELVIS WITH CONTRAST TECHNIQUE: Multidetector CT imaging of the abdomen and pelvis was performed using the standard protocol following bolus administration of intravenous contrast. RADIATION DOSE REDUCTION: This exam was performed according to the departmental dose-optimization program which includes automated exposure control, adjustment of the mA and/or kV according to patient size and/or use of iterative reconstruction technique. CONTRAST:  138m OMNIPAQUE IOHEXOL 300 MG/ML  SOLN COMPARISON:  CT 08/29/2021 FINDINGS: Lower chest: Coronary atherosclerosis. No pericardial effusion. Elevated left hemidiaphragm with mild mass effect upon the left ventricle. Bibasilar atelectasis. Hepatobiliary: Benign-appearing cyst in the right hepatic lobe. No follow-up required. No gallstones, gallbladder wall thickening, or biliary ductal dilation. Pancreas: Unremarkable. No pancreatic ductal dilatation or surrounding inflammatory changes. Spleen: Normal in size without focal abnormality. Adrenals/Urinary Tract: Unremarkable adrenal glands. Low-attenuation lesions in the kidneys are statistically likely to represent cysts. No follow-up is required. No hydronephrosis or  urinary calculi. Unremarkable bladder. Stomach/Bowel: Stomach is within normal limits given respiratory motion. Normal caliber small bowel. Diffuse dilation of predominantly air-filled loops of large bowel greatest in the sigmoid colon. This has slightly increased since 08/29/2021 and measures proximally 9.0 cm in maximum diameter. Large amount of dense liquid stool within the rectum with air-fluid level. Circumferential wall thickening of rectum. Vascular/Lymphatic: Aortic atherosclerosis. No enlarged abdominal or pelvic lymph nodes. Reproductive: Unremarkable prostate. Large right-sided hydrocele is unchanged. Other: No free intraperitoneal air. Musculoskeletal: No acute osseous abnormality. IMPRESSION: Increased marked distention of the large bowel greatest about the sigmoid colon when compared with 08/29/2021. There is hyperdense layering stool  within the rectum. Fecal impaction can not be excluded. Wall thickening about the rectum suggestive of proctitis. Large right-sided hydrocele. Aortic Atherosclerosis (ICD10-I70.0). Electronically Signed   By: Placido Sou M.D.   On: 12/20/2021 02:31      Assessment/Plan Principal Problem:   Abdominal pain Active Problems:   Proctitis   Hypertension   CAD (coronary artery disease)   HLD (hyperlipidemia)   Chronic diastolic CHF (congestive heart failure) (HCC)   Hypokalemia   Hypothermia   BPH (benign prostatic hyperplasia)   Dementia (HCC)   Depression   Assessment and Plan: * Abdominal pain Etiology is not clear.  CT scan showed possible proctitis and increased marked distention of the large bowel greatest about the sigmoid colon. Consulted Dr. Dahlia Byes who evaluated pt and thinks that pt may have fecal impaction. Dr. Dahlia Byes recommended palliative consult. Pt had one episode of diarrhea which is likely due to leakage from possible fecal impaction. Will d/c C. difficile test and GI pathogen panel test.  Patient has more diarrhea, then will check C.  difficile.  Patient does not have acute nausea and vomiting.  -will admit to med-surg bed as inpt -palliative consult -IVF: 75 cc/h -Fleet enema is ordered by Dr. 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 No CP -ASA -Pravastatin  HLD (hyperlipidemia) - Pravastatin  Chronic diastolic CHF (congestive heart failure) (HCC) 2D echo on 11/21/2017 showed EF 75% with grade 1 diastolic dysfunction.  Patient does not have leg edema JVD.  CHF is compensated.  BNP 55.8. -Watch volume status status closely  Hypokalemia Potassium 2.8.  Magnesium 2.0 -Repleted potassium  Hypothermia Body temperature 98.1, 96.0, 97.4. -Keep patient warm  BPH (benign prostatic hyperplasia) - Proscar  Dementia ([place]) Per patient's wife, patient is more confused, and less reactive in the past several days.  No obvious injury. -pt is not taking Excellent -Frequent neurochecks -Palliative consult  Depression - Cymbalta          DVT ppx: SQ Heparin      Code Status: DNR per his wife  Family Communication: Yes, patient's wife   by phone  Disposition Plan:  Anticipate discharge back to previous environment,  home.  Consults called:  Dr. Dahlia Byes of surgery  Admission status and Level of care: Med-Surg:  as inpt        Dispo: The patient is from: Home              Anticipated d/c is to: Home              Anticipated d/c date is: 2 days              Patient currently is not medically stable to d/c.    Severity of Illness:  The appropriate patient status for this patient is INPATIENT. Inpatient status is judged to be reasonable and necessary in order to provide the required intensity of service to ensure the patient's safety. The patient's presenting symptoms, physical exam findings, and initial radiographic and laboratory data in the context of their chronic  comorbidities is felt to place them at high risk for further clinical deterioration. Furthermore, it is not anticipated that the patient will be medically stable for discharge from the hospital within 2 midnights of admission.   * I certify that at the point of admission it is my clinical judgment that the patient will require inpatient  hospital care spanning beyond 2 midnights from the point of admission due to high intensity of service, high risk for further deterioration and high frequency of surveillance required.*       Date of Service 12/20/2021    Ivor Costa [facility]   If 7PM-7AM, please contact night-coverage [email or web address] 12/20/2021, 6:41 PM

## 2021-12-20 NOTE — Plan of Care (Signed)

## 2021-12-20 NOTE — ED Notes (Signed)
Pt's wife at bedside. States pt has not been himself for the past week. Pt has been "more quiet than normal" but confused per normal. Saturday throughout the day pt c/o intermittent belly pain with onset of diarrhea approx 1 hour prior to arrival today.

## 2021-12-20 NOTE — Assessment & Plan Note (Addendum)
Proscar 

## 2021-12-20 NOTE — ED Provider Notes (Signed)
[facility] Provider Note    Event Date/Time   First MD Initiated Contact with Patient 12/20/21 0023     (approximate)   History   Abdominal Pain   HPI  Level 5 caveat:  history/ROS limited by chronic dementia  Alex [name] is a 81 y.o. male with history of Alzheimer's dementia who presents by EMS for evaluation of abdominal pain and diarrhea.  History is very vague from the paramedics and even from his wife after she arrived.  The patient is not able to provide any additional history.  He is not complaining of abdominal pain at this time and reportedly has not been vomiting.  It is unclear how long the symptoms have been going on.  Of note, the report from EMS was that the patient was coming from a group home, but once the patient's wife arrived, she confirmed that he lives at home.     Physical Exam   Triage Vital Signs: ED Triage Vitals  Enc Vitals Group     BP 12/20/21 0005 119/89     Pulse Rate 12/20/21 0006 70     Resp 12/20/21 0030 15     Temp 12/20/21 0027 98.1 F (36.7 C)     Temp Source 12/20/21 0027 Oral     SpO2 12/20/21 0006 98 %     Weight --      Height --      Head Circumference --      Peak Flow --      Pain Score --      Pain Loc --      Pain Edu? --      Excl. in [place]? --     Most recent vital signs: Vitals:   12/20/21 0545 12/20/21 0640  BP:  (!) 169/85  Pulse: 64 64  Resp: 17 16  Temp:  (!) 96 F (35.6 C)  SpO2: 98% 100%     General: Awake, alert, but oriented only to self.  No obvious distress. CV:  Good peripheral perfusion.  Normal heart sounds. Resp:  Normal effort.  Lungs are clear to auscultation. Abd:  Some mild distention of the abdomen.  Generalized tenderness to palpation throughout but without rebound or guarding and no peritonitis. Rectal:  Performed rectal exam with the patient's wife and 2 nurses present in the room as chaperones.  There was loose stool in the rectum but no palpable fecal ball or  fecal mass.  During the rectal exam, the patient passed a large amount of gas but no stool. Other:  Confusion but the patient's wife reports that he is at his baseline.   ED Results / Procedures / Treatments   Labs (all labs ordered are listed, but only abnormal results are displayed) Labs Reviewed  COMPREHENSIVE METABOLIC PANEL - Abnormal; Notable for the following components:      Result Value   Potassium 2.8 (*)    Glucose, Bld 117 (*)    All other components within normal limits  C DIFFICILE QUICK SCREEN W PCR REFLEX    GASTROINTESTINAL PANEL BY PCR, STOOL (REPLACES STOOL CULTURE)  CBC WITH DIFFERENTIAL/PLATELET  LIPASE, BLOOD  LACTIC ACID, PLASMA  MAGNESIUM  PROCALCITONIN  URINALYSIS, ROUTINE W REFLEX MICROSCOPIC     EKG  ED ECG REPORT I, Hinda Kehr, the attending physician, personally viewed and interpreted this ECG.  Date: 12/20/2021 EKG Time: 00: 41 Rate: 71 Rhythm: Sinus rhythm with first-degree AV block QRS Axis: normal Intervals: LVH, prolonged QTc  at 547 ms ST/T Wave abnormalities: Non-specific ST segment / T-wave changes, but no clear evidence of acute ischemia. Narrative Interpretation: no definitive evidence of acute ischemia; does not meet STEMI criteria.    RADIOLOGY I viewed and interpreted the patient's CT scan of the abdomen and pelvis.  It is concerning for large colonic distention with some air-fluid levels.  There also may be some inflammation around his rectum.  Radiologist confirmed extensive distention worse than on prior scan and concern for acute proctitis as well as possible fecal impaction of the rectum.    PROCEDURES:  Critical Care performed: No  Fecal disimpaction  Date/Time: 12/20/2021 7:27 AM  Performed by: Hinda Kehr, MD Authorized by: Hinda Kehr, MD  Consent: The procedure was performed in an emergent situation. Verbal consent obtained. Consent given by: spouse Patient identity confirmed: verbally with patient and  arm band Local anesthesia used: no  Anesthesia: Local anesthesia used: no  Sedation: Patient sedated: no  Comments: Patient did not tolerate the procedure well because he was not sure what was going on, but chaperones were present in assisted, and his wife tried to explain to them what was happening and try to reassure him.  Loose stool was present in his rectum without any palpable stool ball.      MEDICATIONS ORDERED IN ED: Medications  ondansetron (ZOFRAN) injection 4 mg (4 mg Intravenous Given 12/20/21 0045)  potassium chloride SA (KLOR-CON M) CR tablet 40 mEq (40 mEq Oral Given 12/20/21 0108)  iohexol (OMNIPAQUE) 300 MG/ML solution 100 mL (100 mLs Intravenous Contrast Given 12/20/21 0155)  cefTRIAXone (ROCEPHIN) 1 g in sodium chloride 0.9 % 100 mL IVPB (0 g Intravenous Stopped 12/20/21 0555)  metroNIDAZOLE (FLAGYL) IVPB 500 mg (0 mg Intravenous Stopped 12/20/21 0611)     IMPRESSION / MDM / ASSESSMENT AND PLAN / ED COURSE  I reviewed the triage vital signs and the nursing notes.                              Differential diagnosis includes, but is not limited to, SBO/ileus, acute intra-abdominal infection, infectious diarrhea, fecal impaction.  Patient's presentation is most consistent with acute presentation with potential threat to life or bodily function.  Labs/studies ordered: EKG, CMP, CBC with differential, lipase, lactic acid, procalcitonin, C. difficile PCR, GI pathogen panel, magnesium level, CT abdomen pelvis with IV contrast.  The patient is on the cardiac monitor to evaluate for evidence of arrhythmia and/or significant heart rate changes.  History is very difficult to obtain both from the patient and his spouse.  I reviewed the medical record and see that he has had multiple visits for various reasons and during 1 visit he had abdominal pain and some possible fecal impaction but with no palpable impaction on digital rectal exam.  This visit was approximately 3-1/2  months ago.  I will order a CT of the abdomen and pelvis once verifying his renal function is normal.   Clinical Course as of 12/20/21 0733  Sun Dec 20, 2021  0104 Potassium(!): 2.8 CMP normal other than hypokalemia.  I ordered 40 mill equivalents of potassium by mouth.  Given the patient's history of abdominal pain, I am ordering CT scan with abdomen and pelvis.   [CF]  0104 CBC with Differential CBC is normal including no leukocytosis [CF]  0142 Lactic Acid, Venous: 1.6 [CF]  0500 As documented above, I reviewed the CT results.  It is concerning for  substantial colonic distention, worse than prior, with possible fecal impaction and proctitis.  This is concerning for proctitis and the patient is also having some diarrhea, possibly around the impaction.  I we will perform a digital rectal exam and attempted disimpaction, but the patient will need admission for the concerning findings on CT scan and the report of abdominal pain.  I updated the patient's wife who agrees.  I ordered ceftriaxone 1 g IV and metronidazole 500 mg IV given the concern for infection/proctitis. [CF]  5456 Consulted with Dr. Sidney Ace with the hospitalist service.  He will admit the patient [CF]    Clinical Course User Index [CF] Hinda Kehr, MD     FINAL CLINICAL IMPRESSION(S) / ED DIAGNOSES   Final diagnoses:  Obstipation  Proctitis  Fecal impaction ([place])  Alzheimer's dementia, unspecified dementia severity, unspecified timing of dementia onset, unspecified whether behavioral, psychotic, or mood disturbance or anxiety ([place])  Generalized abdominal pain  Hypokalemia     Rx / DC Orders   ED Discharge Orders     None        Note:  This document was prepared using Dragon voice recognition software and may include unintentional dictation errors.   Hinda Kehr, MD 12/20/21 [phone]

## 2021-12-20 NOTE — Assessment & Plan Note (Addendum)
Cymbalta 

## 2021-12-20 NOTE — ED Notes (Signed)
ED TO INPATIENT HANDOFF REPORT  ED Nurse Name and Phone #: Alex [name] Name/Age/Gender [name] 81 y.o. male Room/Bed: ED15A/ED15A  Code Status   Code Status: DNR  Home/SNF/Other Home Patient oriented to: self Is this baseline? Yes   Triage Complete: Triage complete  Chief Complaint Abdominal pain [R10.9]  Triage Note Pt BIB [facility] from a group home with caregiver, EMS unable to provide home facility name. EMS called by caregiver d/t diarrhea, abdominal pain, and AMS. Unknown LKN. Unknown baseline mentation. Reports diarrhea x1 hr. DNR at bedside.    Allergies Allergies  Allergen Reactions   Donepezil Diarrhea   Penicillins Other (See Comments) and Itching    Unknown reaction, childhood  Has patient had a PCN reaction causing immediate rash, facial/tongue/throat swelling, SOB or lightheadedness with hypotension: Yes Has patient had a PCN reaction causing severe rash involving mucus membranes or skin necrosis: No Has patient had a PCN reaction that required hospitalization: No Has patient had a PCN reaction occurring within the last 10 years: No If all of the above answers are "NO", then may proceed with Cephalosporin use.     Level of Care/Admitting Diagnosis ED Disposition     ED Disposition  Admit   Condition  --   [facility]: [place] [[id number]]  Level of Care: Med-Surg [16]  Covid Evaluation: Asymptomatic - no recent exposure (last 10 days) testing not required  Diagnosis: Abdominal pain [[id number]]  Admitting Physician: Christel Mormon [[id number]]  Attending Physician: Christel Mormon [[id number]]          B Medical/Surgery History Past Medical History:  Diagnosis Date   Alzheimer's dementia ([place])    BPH (benign prostatic hyperplasia)    CAD (coronary artery disease)    Cancer ([place])    Prostate Cancer per wife   Dementia ([place])    Hypertension    Past Surgical History:  Procedure Laterality Date   CORONARY  ANGIOPLASTY WITH STENT PLACEMENT     HERNIA REPAIR       A IV Location/Drains/Wounds Patient Lines/Drains/Airways Status     Active Line/Drains/Airways     Name Placement date Placement time Site Days   Peripheral IV 12/20/21 20 G 1" Left Forearm 12/20/21  0030  Forearm  less than 1            Intake/Output Last 24 hours No intake or output data in the 24 hours ending 12/20/21 0543  Labs/Imaging Results for orders placed or performed during the hospital encounter of 12/20/21 (from the past 48 hour(s))  CBC with Differential     Status: None   Collection Time: 12/20/21 12:15 AM  Result Value Ref Range   WBC 7.8 4.0 - 10.5 K/uL   RBC 4.67 4.22 - 5.81 MIL/uL   Hemoglobin 14.0 13.0 - 17.0 g/dL   HCT 42.4 39.0 - 52.0 %   MCV 90.8 80.0 - 100.0 fL   MCH 30.0 26.0 - 34.0 pg   MCHC 33.0 30.0 - 36.0 g/dL   RDW 13.9 11.5 - 15.5 %   Platelets 173 150 - 400 K/uL   nRBC 0.0 0.0 - 0.2 %   Neutrophils Relative % 59 %   Neutro Abs 4.7 1.7 - 7.7 K/uL   Lymphocytes Relative 23 %   Lymphs Abs 1.8 0.7 - 4.0 K/uL   Monocytes Relative 10 %   Monocytes Absolute 0.8 0.1 - 1.0 K/uL   Eosinophils Relative 7 %   Eosinophils Absolute 0.5  0.0 - 0.5 K/uL   Basophils Relative 1 %   Basophils Absolute 0.0 0.0 - 0.1 K/uL   Immature Granulocytes 0 %   Abs Immature Granulocytes 0.01 0.00 - 0.07 K/uL    Comment: Performed at [facility], [address] [place], [place] [address]  Comprehensive metabolic panel     Status: Abnormal   Collection Time: 12/20/21 12:15 AM  Result Value Ref Range   Sodium 143 135 - 145 mmol/L   Potassium 2.8 (L) 3.5 - 5.1 mmol/L   Chloride 105 98 - 111 mmol/L   CO2 29 22 - 32 mmol/L   Glucose, Bld 117 (H) 70 - 99 mg/dL    Comment: Glucose reference range applies only to samples taken after fasting for at least 8 hours.   BUN 18 8 - 23 mg/dL   Creatinine, Ser 1.14 0.61 - 1.24 mg/dL   Calcium 9.2 8.9 - 10.3 mg/dL   Total Protein 7.4 6.5 - 8.1 g/dL    Albumin 4.0 3.5 - 5.0 g/dL   AST 18 15 - 41 U/L   ALT 8 0 - 44 U/L   Alkaline Phosphatase 78 38 - 126 U/L   Total Bilirubin 0.7 0.3 - 1.2 mg/dL   GFR, Estimated >60 >60 mL/min    Comment: (NOTE) Calculated using the CKD-EPI Creatinine Equation (2021)    Anion gap 9 5 - 15    Comment: Performed at [facility] [address] [place], [place] [address]  Lipase, blood     Status: None   Collection Time: 12/20/21 12:15 AM  Result Value Ref Range   Lipase 41 11 - 51 U/L    Comment: Performed at [facility], [address] [place], [place] [address]  Lactic acid, plasma     Status: None   Collection Time: 12/20/21 12:15 AM  Result Value Ref Range   Lactic Acid, Venous 1.6 0.5 - 1.9 mmol/L    Comment: Performed at [facility], [address] [place], [place] [address]  Magnesium     Status: None   Collection Time: 12/20/21 12:15 AM  Result Value Ref Range   Magnesium 2.0 1.7 - 2.4 mg/dL    Comment: Performed at [facility], [address] [place],  [address]  Procalcitonin - Baseline     Status: None   Collection Time: 12/20/21 12:15 AM  Result Value Ref Range   Procalcitonin <0.10 ng/mL    Comment:        Interpretation: PCT (Procalcitonin) <= 0.5 ng/mL: Systemic infection (sepsis) is not likely. Local bacterial infection is possible. (NOTE)       Sepsis PCT Algorithm           Lower Respiratory Tract                                      Infection PCT Algorithm    ----------------------------     ----------------------------         PCT < 0.25 ng/mL                PCT < 0.10 ng/mL          Strongly encourage             Strongly discourage   discontinuation of antibiotics    initiation of antibiotics    ----------------------------     -----------------------------  PCT 0.25 - 0.50 ng/mL            PCT 0.10 - 0.25 ng/mL               OR       >80% decrease in PCT            Discourage initiation of                                             antibiotics      Encourage discontinuation           of antibiotics    ----------------------------     -----------------------------         PCT >= 0.50 ng/mL              PCT 0.26 - 0.50 ng/mL               AND        <80% decrease in PCT             Encourage initiation of                                             antibiotics       Encourage continuation           of antibiotics    ----------------------------     -----------------------------        PCT >= 0.50 ng/mL                  PCT > 0.50 ng/mL               AND         increase in PCT                  Strongly encourage                                      initiation of antibiotics    Strongly encourage escalation           of antibiotics                                     -----------------------------                                           PCT <= 0.25 ng/mL                                                 OR                                        > 80% decrease in PCT  Discontinue / Do not initiate                                             antibiotics  Performed at [facility], [address] [place], [place] [address]    CT ABDOMEN PELVIS W CONTRAST  Result Date: 12/20/2021 CLINICAL DATA:  Diarrhea, abdominal pain, and altered mental status EXAM: CT ABDOMEN AND PELVIS WITH CONTRAST TECHNIQUE: Multidetector CT imaging of the abdomen and pelvis was performed using the standard protocol following bolus administration of intravenous contrast. RADIATION DOSE REDUCTION: This exam was performed according to the departmental dose-optimization program which includes automated exposure control, adjustment of the mA and/or kV according to patient size and/or use of iterative reconstruction technique. CONTRAST:  165m OMNIPAQUE IOHEXOL 300 MG/ML  SOLN COMPARISON:  CT 08/29/2021 FINDINGS: Lower chest: Coronary atherosclerosis. No pericardial effusion.  Elevated left hemidiaphragm with mild mass effect upon the left ventricle. Bibasilar atelectasis. Hepatobiliary: Benign-appearing cyst in the right hepatic lobe. No follow-up required. No gallstones, gallbladder wall thickening, or biliary ductal dilation. Pancreas: Unremarkable. No pancreatic ductal dilatation or surrounding inflammatory changes. Spleen: Normal in size without focal abnormality. Adrenals/Urinary Tract: Unremarkable adrenal glands. Low-attenuation lesions in the kidneys are statistically likely to represent cysts. No follow-up is required. No hydronephrosis or urinary calculi. Unremarkable bladder. Stomach/Bowel: Stomach is within normal limits given respiratory motion. Normal caliber small bowel. Diffuse dilation of predominantly air-filled loops of large bowel greatest in the sigmoid colon. This has slightly increased since 08/29/2021 and measures proximally 9.0 cm in maximum diameter. Large amount of dense liquid stool within the rectum with air-fluid level. Circumferential wall thickening of rectum. Vascular/Lymphatic: Aortic atherosclerosis. No enlarged abdominal or pelvic lymph nodes. Reproductive: Unremarkable prostate. Large right-sided hydrocele is unchanged. Other: No free intraperitoneal air. Musculoskeletal: No acute osseous abnormality. IMPRESSION: Increased marked distention of the large bowel greatest about the sigmoid colon when compared with 08/29/2021. There is hyperdense layering stool within the rectum. Fecal impaction can not be excluded. Wall thickening about the rectum suggestive of proctitis. Large right-sided hydrocele. Aortic Atherosclerosis (ICD10-I70.0). Electronically Signed   By: TPlacido SouM.D.   On: 12/20/2021 02:31    Pending Labs Unresulted Labs (From admission, onward)     Start     Ordered   12/20/21 0027  C Difficile Quick Screen w PCR reflex  (C Difficile quick screen w PCR reflex panel )  Once, for 24 hours,   URGENT       References:    CDiff  Information Tool   12/20/21 0026   12/20/21 0027  Gastrointestinal Panel by PCR , Stool  (Gastrointestinal Panel by PCR, Stool                                                                                                                                                     **  Does Not include CLOSTRIDIUM DIFFICILE testing. **If CDIFF testing is needed, place order from the "C Difficile Testing" order set.**)  Once,   URGENT        12/20/21 0026   12/20/21 0024  Urinalysis, Routine w reflex microscopic  (Abdominal Pain)  Once,   URGENT        12/20/21 0026            Vitals/Pain Today's Vitals   12/20/21 0445 12/20/21 0500 12/20/21 0515 12/20/21 0530  BP:  128/80  126/77  Pulse: 60 66 67 64  Resp: '18 16 19 13  '$ Temp:      TempSrc:      SpO2: 94% 95% 99% 99%  PainSc:        Isolation Precautions Enteric precautions (UV disinfection)  Medications Medications  cefTRIAXone (ROCEPHIN) 1 g in sodium chloride 0.9 % 100 mL IVPB (1 g Intravenous New Bag/Given 12/20/21 0519)  metroNIDAZOLE (FLAGYL) IVPB 500 mg (500 mg Intravenous New Bag/Given 12/20/21 0520)  ondansetron (ZOFRAN) injection 4 mg (4 mg Intravenous Given 12/20/21 0045)  potassium chloride SA (KLOR-CON M) CR tablet 40 mEq (40 mEq Oral Given 12/20/21 0108)  iohexol (OMNIPAQUE) 300 MG/ML solution 100 mL (100 mLs Intravenous Contrast Given 12/20/21 0155)    Mobility non-ambulatory High fall risk   Focused Assessments Neuro Assessment Handoff:   Cardiac Rhythm: Normal sinus rhythm       Neuro Assessment: Exceptions to WDL Neuro Checks: WDL      If patient is a Neuro Trauma and patient is going to OR before floor call report to [place] nurse: [phone] or [phone]  , -   R Recommendations: See Admitting Provider Note  Report given to:   Additional Notes: -

## 2021-12-20 NOTE — Assessment & Plan Note (Addendum)
Initially on admission, slightly more confused than baseline.  Likely secondary to underlying medical issues as above.  Since then, has been more calm.

## 2021-12-20 NOTE — ED Notes (Signed)
No recurrent Diarrhea. Family at bedside

## 2021-12-20 NOTE — Assessment & Plan Note (Signed)
2D echo on 11/21/2017 showed EF 75% with grade 1 diastolic dysfunction.  Patient does not have leg edema JVD.  CHF is compensated.  BNP 55.8. -Watch volume status status closely

## 2021-12-20 NOTE — Assessment & Plan Note (Addendum)
Possible proctitis:  -IV rocephin and flagyl, completes antibiotics on 9/29

## 2021-12-20 NOTE — ED Triage Notes (Signed)
Pt BIB [facility] from a group home with caregiver, EMS unable to provide home facility name. EMS called by caregiver d/t diarrhea, abdominal pain, and AMS. Unknown LKN. Unknown baseline mentation. Reports diarrhea x1 hr. DNR at bedside.

## 2021-12-20 NOTE — Assessment & Plan Note (Signed)
Etiology is not clear.  CT scan showed possible proctitis and increased marked distention of the large bowel greatest about the sigmoid colon. Consulted Dr. Dahlia Byes who evaluated pt and thinks that pt may have fecal impaction. Dr. Dahlia Byes recommended palliative consult. Pt had one episode of diarrhea which is likely due to leakage from possible fecal impaction. Will d/c C. difficile test and GI pathogen panel test.  Patient has more diarrhea, then will check C. difficile.  Patient does not have acute nausea and vomiting.  -will admit to med-surg bed as inpt -palliative consult -IVF: 75 cc/h -Fleet enema is ordered by Dr. Dahlia Byes

## 2021-12-20 NOTE — ED Notes (Signed)
Unable to reach family to collaborate additional information

## 2021-12-20 NOTE — Assessment & Plan Note (Signed)
Secondary to illness.  Resolved.

## 2021-12-20 NOTE — Assessment & Plan Note (Addendum)
S/o of stent. No CP -ASA -Pravastatin

## 2021-12-20 NOTE — Assessment & Plan Note (Addendum)
-   IV hydralazine as needed -Amlodipine 

## 2021-12-20 NOTE — Plan of Care (Signed)
  Problem: Education: Goal: Knowledge of General Education information will improve Description: Including pain rating scale, medication(s)/side effects and non-pharmacologic comfort measures 12/20/2021 2031 by Jule Ser, RN Outcome: Progressing 12/20/2021 0647 by Jule Ser, RN Outcome: Progressing   Problem: Health Behavior/Discharge Planning: Goal: Ability to manage health-related needs will improve 12/20/2021 2031 by Jule Ser, RN Outcome: Progressing 12/20/2021 0647 by Jule Ser, RN Outcome: Progressing   Problem: Clinical Measurements: Goal: Ability to maintain clinical measurements within normal limits will improve 12/20/2021 2031 by Jule Ser, RN Outcome: Progressing 12/20/2021 0647 by Jule Ser, RN Outcome: Progressing Goal: Will remain free from infection 12/20/2021 2031 by Jule Ser, RN Outcome: Progressing 12/20/2021 0647 by Jule Ser, RN Outcome: Progressing Goal: Diagnostic test results will improve 12/20/2021 2031 by Jule Ser, RN Outcome: Progressing 12/20/2021 0647 by Jule Ser, RN Outcome: Progressing Goal: Respiratory complications will improve 12/20/2021 2031 by Jule Ser, RN Outcome: Progressing 12/20/2021 0647 by Jule Ser, RN Outcome: Progressing Goal: Cardiovascular complication will be avoided 12/20/2021 2031 by Jule Ser, RN Outcome: Progressing 12/20/2021 0647 by Jule Ser, RN Outcome: Progressing   Problem: Activity: Goal: Risk for activity intolerance will decrease 12/20/2021 2031 by Jule Ser, RN Outcome: Progressing 12/20/2021 0647 by Jule Ser, RN Outcome: Progressing   Problem: Nutrition: Goal: Adequate nutrition will be maintained 12/20/2021 2031 by Jule Ser, RN Outcome: Progressing 12/20/2021 0647 by Jule Ser, RN Outcome: Progressing   Problem: Coping: Goal: Level of anxiety will decrease 12/20/2021 2031 by Jule Ser, RN Outcome: Progressing 12/20/2021 0647 by Jule Ser, RN Outcome: Progressing   Problem: Elimination: Goal: Will not experience complications related to bowel motility 12/20/2021 2031 by Jule Ser, RN Outcome: Progressing 12/20/2021 0647 by Jule Ser, RN Outcome: Progressing Goal: Will not experience complications related to urinary retention 12/20/2021 2031 by Jule Ser, RN Outcome: Progressing 12/20/2021 0647 by Jule Ser, RN Outcome: Progressing   Problem: Pain Managment: Goal: General experience of comfort will improve 12/20/2021 2031 by Jule Ser, RN Outcome: Progressing 12/20/2021 0647 by Jule Ser, RN Outcome: Progressing   Problem: Safety: Goal: Ability to remain free from injury will improve 12/20/2021 2031 by Jule Ser, RN Outcome: Progressing 12/20/2021 0647 by Jule Ser, RN Outcome: Progressing   Problem: Skin Integrity: Goal: Risk for impaired skin integrity will decrease 12/20/2021 2031 by Jule Ser, RN Outcome: Progressing 12/20/2021 0647 by Jule Ser, RN Outcome: Progressing

## 2021-12-21 ENCOUNTER — Inpatient Hospital Stay: Payer: Medicare HMO

## 2021-12-21 DIAGNOSIS — E876 Hypokalemia: Secondary | ICD-10-CM

## 2021-12-21 DIAGNOSIS — K310 Acute dilatation of stomach: Secondary | ICD-10-CM | POA: Diagnosis not present

## 2021-12-21 DIAGNOSIS — F03C11 Unspecified dementia, severe, with agitation: Secondary | ICD-10-CM | POA: Diagnosis not present

## 2021-12-21 DIAGNOSIS — R1084 Generalized abdominal pain: Secondary | ICD-10-CM | POA: Diagnosis not present

## 2021-12-21 DIAGNOSIS — K5900 Constipation, unspecified: Secondary | ICD-10-CM | POA: Diagnosis not present

## 2021-12-21 LAB — BASIC METABOLIC PANEL
Anion gap: 7 (ref 5–15)
BUN: 12 mg/dL (ref 8–23)
CO2: 29 mmol/L (ref 22–32)
Calcium: 8.9 mg/dL (ref 8.9–10.3)
Chloride: 109 mmol/L (ref 98–111)
Creatinine, Ser: 1.01 mg/dL (ref 0.61–1.24)
GFR, Estimated: >60 mL/min (ref >60)
Glucose, Bld: 93 mg/dL (ref 70–99)
Potassium: 3.1 mmol/L — ABNORMAL LOW (ref 3.5–5.1)
Sodium: 145 mmol/L (ref 135–145)

## 2021-12-21 LAB — CBC
HCT: 38.5 % — ABNORMAL LOW (ref 39.0–52.0)
Hemoglobin: 12.8 g/dL — ABNORMAL LOW (ref 13.0–17.0)
MCH: 29.8 pg (ref 26.0–34.0)
MCHC: 33.2 g/dL (ref 30.0–36.0)
MCV: 89.5 fL (ref 80.0–100.0)
Platelets: 126 10*3/uL — ABNORMAL LOW (ref 150–400)
RBC: 4.3 MIL/uL (ref 4.22–5.81)
RDW: 13.6 % (ref 11.5–15.5)
WBC: 5.7 10*3/uL (ref 4.0–10.5)
nRBC: 0 % (ref 0.0–0.2)

## 2021-12-21 MED ORDER — BOOST / RESOURCE BREEZE PO LIQD CUSTOM
1.0000 | Freq: Three times a day (TID) | ORAL | Status: DC
  Administered 2021-12-22 – 2021-12-25 (×5): 1 via ORAL

## 2021-12-21 MED ORDER — POLYETHYLENE GLYCOL 3350 17 G PO PACK
17.0000 g | PACK | Freq: Every day | ORAL | Status: DC
  Administered 2021-12-21 – 2021-12-25 (×5): 17 g via ORAL
  Filled 2021-12-21 (×5): qty 1

## 2021-12-21 MED ORDER — QUETIAPINE FUMARATE 25 MG PO TABS: 50.0000 mg | ORAL_TABLET | Freq: Two times a day (BID) | ORAL | Status: DC | PRN

## 2021-12-21 MED ORDER — DOCUSATE SODIUM 100 MG PO CAPS
200.0000 mg | ORAL_CAPSULE | Freq: Two times a day (BID) | ORAL | Status: DC
  Administered 2021-12-21 – 2021-12-24 (×8): 200 mg via ORAL
  Filled 2021-12-21 (×8): qty 2

## 2021-12-21 MED ORDER — POTASSIUM CHLORIDE 20 MEQ PO PACK
40.0000 meq | PACK | Freq: Two times a day (BID) | ORAL | Status: AC
  Administered 2021-12-21 (×2): 40 meq via ORAL
  Filled 2021-12-21 (×2): qty 2

## 2021-12-21 NOTE — Progress Notes (Signed)
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 Continue on IVFs. Not a good surgical candidate as per gen surg. Started on clear liquid diet as per gen surg. Started bowel regimen. Gen surg recs apprec    Possible proctitis: continue on IV rocephin, flagyl   Generalized weakness: not walked in approx 1 month as per pt's mother. PT/OT consulted    HTN: continue on amlodipine. Hydralazine prn    CAD: no chest pain. Continue on aspirin, statin    HLD: continue on statin    Chronic diastolic CHF: echo on 6/59/9357 showed EF 75% with grade 1 diastolic dysfunction. CHF is compensated.  BNP 55.8. Monitor I/Os.    Hypokalemia: potassium ordered   Hypothermia: resolved    BPH: continue on home dose of finasteride    Dementia: continue w/ supportive care. Seroquel prn for agitation. Palliative care consulted    Depression: severity unknown. Continue on home dose of duloxetine   Thrombocytopenia: etiology unclear. Will continue to monitor     DVT prophylaxis: heparin SQ Code Status: DNR Family Communication: discussed pt's care w/ pt's wife, [name], and answered her questions Disposition Plan: possibly d/c back home. Palliative care consulted  Level of care: Med-Surg  Status is: Inpatient Remains inpatient appropriate because: severity of illness    Consultants:  Gen surg Palliative care  Procedures:   Antimicrobials: rocephin,  flagyl   Subjective: Pt is agitated and not answering questions   Objective: Vitals:   12/20/21 0921 12/20/21 1642 12/20/21 2012 12/21/21 0608  BP: (!) 155/96 (!) 144/98 (!) 164/107 126/77  Pulse: 71 78 74 72  Resp:  '18 18 16  '$ Temp: (!) 97.4 F (36.3 C) 97.7 F (36.5 C) 98.7 F (37.1 C) 98 F (36.7 C)  TempSrc: Oral   Axillary  SpO2: 99% 97% 97% 100%  Weight:      Height:        Intake/Output Summary (Last 24 hours) at 12/21/2021 0830 Last data filed at 12/21/2021 0600 Gross per 24 hour  Intake 1664.49 ml  Output --  Net 1664.49 ml   Filed Weights   12/20/21 0640  Weight: 80.6 kg    Examination:  General exam: Appears agitated  Respiratory system: Clear to auscultation. Respiratory effort normal. Cardiovascular system: S1 & S2+. No rubs, gallops or clicks.  Gastrointestinal system: Abdomen is mild distention, soft and nontender. Normal bowel sounds heard. Central nervous system: Alert and awake. Moves all extremities  Psychiatry: Judgement and insight appears poor. Agitated mood and affect    Data Reviewed: I have personally reviewed following labs and imaging studies  CBC: Recent Labs  Lab 12/20/21 0015 12/21/21 0607  WBC 7.8 5.7  NEUTROABS 4.7  --   HGB 14.0 12.8*  HCT 42.4 38.5*  MCV 90.8 89.5  PLT 173 017*   Basic Metabolic Panel: Recent Labs  Lab 12/20/21 0015 12/21/21 0607  NA 143 145  K 2.8* 3.1*  CL 105 109  CO2 29 29  GLUCOSE 117* 93  BUN 18 12  CREATININE 1.14 1.01  CALCIUM 9.2 8.9  MG 2.0  --    GFR: Estimated Creatinine Clearance: 63 mL/min (by C-G formula based on SCr of 1.01 mg/dL). Liver Function Tests: Recent Labs  Lab 12/20/21 0015  AST 18  ALT 8  ALKPHOS 78  BILITOT 0.7  PROT 7.4  ALBUMIN 4.0   Recent Labs  Lab 12/20/21 0015  LIPASE 41   No results for input(s): "AMMONIA" in the last 168 hours. Coagulation Profile: Recent Labs  Lab 12/20/21 0859  INR 1.1   Cardiac Enzymes: No results for input(s):  "CKTOTAL", "CKMB", "CKMBINDEX", "TROPONINI" in the last 168 hours. BNP (last 3 results) No results for input(s): "PROBNP" in the last 8760 hours. HbA1C: No results for input(s): "HGBA1C" in the last 72 hours. CBG: No results for input(s): "GLUCAP" in the last 168 hours. Lipid Profile: No results for input(s): "CHOL", "HDL", "LDLCALC", "TRIG", "CHOLHDL", "LDLDIRECT" in the last 72 hours. Thyroid Function Tests: No results for input(s): "TSH", "T4TOTAL", "FREET4", "T3FREE", "THYROIDAB" in the last 72 hours. Anemia Panel: No results for input(s): "VITAMINB12", "FOLATE", "FERRITIN", "TIBC", "IRON", "RETICCTPCT" in the last 72 hours. Sepsis Labs: Recent Labs  Lab 12/20/21 0015  PROCALCITON <0.10  LATICACIDVEN 1.6    No results found for this or any previous visit (from the past 240 hour(s)).       Radiology Studies: DG ABD ACUTE 2+V W 1V CHEST  Result Date: 12/21/2021 CLINICAL DATA:  Ogilvie syndrome. EXAM: DG ABDOMEN ACUTE WITH 1 VIEW CHEST COMPARISON:  CT abdomen and pelvis 12/20/2021. Chest radiographs 07/21/2019. FINDINGS: The cardiomediastinal silhouette is within normal limits. Lung volumes are low with asymmetric elevation of the left hemidiaphragm. Mild opacities are present in the lung bases and right mid lung. No sizable pleural effusion or pneumothorax is identified. No intraperitoneal free air is identified. Gaseous distension of the colon, greatest in the region of the sigmoid colon, is similar to the recent CT. A small to moderate amount of stool is present in the colon and rectum. No significant small bowel dilatation is evident. No acute osseous abnormality is seen. IMPRESSION: 1. Unchanged colonic distension. 2. Low lung volumes with mild bibasilar and right mid lung opacities, likely atelectasis although early infection is not excluded. Electronically Signed   By: Logan Bores M.D.   On: 12/21/2021 08:22   CT ABDOMEN PELVIS W CONTRAST  Result Date: 12/20/2021 CLINICAL  DATA:  Diarrhea, abdominal pain, and altered mental status EXAM: CT ABDOMEN AND PELVIS WITH CONTRAST TECHNIQUE: Multidetector CT imaging of the abdomen and pelvis was performed using the standard protocol following bolus administration of intravenous contrast. RADIATION DOSE REDUCTION: This exam was performed according to the departmental dose-optimization program which includes automated exposure control, adjustment of the mA and/or kV according to patient size and/or use of iterative reconstruction technique. CONTRAST:  134m OMNIPAQUE IOHEXOL 300 MG/ML  SOLN COMPARISON:  CT 08/29/2021 FINDINGS: Lower chest: Coronary atherosclerosis. No pericardial effusion. Elevated left hemidiaphragm with mild mass effect upon the left ventricle. Bibasilar atelectasis. Hepatobiliary: Benign-appearing cyst in the right hepatic lobe. No follow-up required. No gallstones, gallbladder wall thickening, or biliary ductal dilation. Pancreas: Unremarkable. No pancreatic ductal dilatation or surrounding inflammatory changes. Spleen: Normal in size without focal abnormality. Adrenals/Urinary Tract: Unremarkable adrenal glands. Low-attenuation lesions in the kidneys are statistically likely to represent cysts. No follow-up is required. No hydronephrosis or urinary calculi. Unremarkable bladder. Stomach/Bowel: Stomach is within normal limits given respiratory motion. Normal caliber  small bowel. Diffuse dilation of predominantly air-filled loops of large bowel greatest in the sigmoid colon. This has slightly increased since 08/29/2021 and measures proximally 9.0 cm in maximum diameter. Large amount of dense liquid stool within the rectum with air-fluid level. Circumferential wall thickening of rectum. Vascular/Lymphatic: Aortic atherosclerosis. No enlarged abdominal or pelvic lymph nodes. Reproductive: Unremarkable prostate. Large right-sided hydrocele is unchanged. Other: No free intraperitoneal air. Musculoskeletal: No acute osseous  abnormality. IMPRESSION: Increased marked distention of the large bowel greatest about the sigmoid colon when compared with 08/29/2021. There is hyperdense layering stool within the rectum. Fecal impaction can not be excluded. Wall thickening about the rectum suggestive of proctitis. Large right-sided hydrocele. Aortic Atherosclerosis (ICD10-I70.0). Electronically Signed   By: Placido Sou M.D.   On: 12/20/2021 02:31        Scheduled Meds:  amLODipine  5 mg Oral Daily   ascorbic acid  500 mg Oral Daily   aspirin EC  81 mg Oral Daily   cyanocobalamin  1,000 mcg Oral Daily   DULoxetine  30 mg Oral Daily   finasteride  5 mg Oral Daily   heparin  5,000 Units Subcutaneous Q8H   melatonin  5 mg Oral QHS   multivitamin with minerals  1 tablet Oral Daily   pantoprazole  20 mg Oral Daily   potassium chloride  40 mEq Oral BID   pravastatin  80 mg Oral Daily   Continuous Infusions:  sodium chloride Stopped (12/21/21 0521)   cefTRIAXone (ROCEPHIN)  IV     metronidazole 100 mL/hr at 12/21/21 0600     LOS: 1 day    Time spent: 35 mins    Wyvonnia Dusky, MD [facility] Pager [phone]  If 7PM-7AM, please contact night-coverage [email or web address] 12/21/2021, 8:30 AM

## 2021-12-21 NOTE — Evaluation (Signed)
Physical Therapy Evaluation Patient Details Name: Alex [name] MRN: [id number] DOB: Jul 23, 1940 Today's Date: 12/21/2021  History of Present Illness  Pt is an 81 yo M diagnosed with abdominal pain, weakness, possible proctitis, and hypokalemia.  PMH includes dementia, HTN, CAD, and prostate CA.   Clinical Impression  Per spouse via phone call as well as one of patient's personal care aides in room pt is bed bound and dependent level of care with PCA's at home.  Per MD pt's spouse requested PT evaluation while in acute care.  Pt's aide stated that the pt typically resists his caregivers when they attempt to move him in the bed to change his briefs or linens.  During attempt to mobilize patient during this evaluation with +2 assist pt followed no commands and actively resisted all attempts to come to sitting.  Pt became increasingly more agitated as the session progressed. Pt's aide in room stated that this was typical for patient and is why he requires +2 assist at home at baseline.  Due to pt's profound cognitive deficits and resistance to any mobilization he is not appropriate for skilled PT services at this time and will complete PT orders.       Recommendations for follow up therapy are one component of a multi-disciplinary discharge planning process, led by the attending physician.  Recommendations may be updated based on patient status, additional functional criteria and insurance authorization.  Follow Up Recommendations No PT follow up      Assistance Recommended at Discharge Frequent or constant Supervision/Assistance  Patient can return home with the following  Two people to help with walking and/or transfers;Two people to help with bathing/dressing/bathroom;Assistance with cooking/housework;Assistance with feeding;Assist for transportation;Direct supervision/assist for medications management;Direct supervision/assist for financial management;Help with stairs or ramp for entrance     Equipment Recommendations None recommended by PT  Recommendations for Other Services       Functional Status Assessment Patient has not had a recent decline in their functional status     Precautions / Restrictions Precautions Precautions: Fall Restrictions Weight Bearing Restrictions: No      Mobility  Bed Mobility Overal bed mobility: Needs Assistance             General bed mobility comments: Attempted sup to sit but pt actively resisted throughout    Transfers                   General transfer comment: Unable/unsafe to attempt    Ambulation/Gait                  Stairs            Wheelchair Mobility    Modified Rankin (Stroke Patients Only)       Balance                                             Pertinent Vitals/Pain Pain Assessment Pain Assessment: PAINAD Breathing: normal Negative Vocalization: none Facial Expression: smiling or inexpressive Body Language: relaxed Consolability: no need to console PAINAD Score: 0    Home Living Family/patient expects to be discharged to:: Private residence Living Arrangements: Spouse/significant other Available Help at Discharge: Family;Available 24 hours/day;Personal care attendant                    Prior Function  Mobility Comments: Per spouse and one of pt's aides in room pt dependent level of care at baseline; typically requires +2 assist for linen changes and hygiene secondary to pt actively resisting attempts to mobilize.  Pt wears depends and is changed when soiled ADLs Comments: Dependent with all ADLs     Hand Dominance        Extremity/Trunk Assessment   Upper Extremity Assessment Upper Extremity Assessment: Difficult to assess due to impaired cognition    Lower Extremity Assessment Lower Extremity Assessment: Difficult to assess due to impaired cognition       Communication      Cognition Arousal/Alertness:  Awake/alert Behavior During Therapy: Agitated Overall Cognitive Status: History of cognitive impairments - at baseline                                 General Comments: History of profound dementia        General Comments      Exercises     Assessment/Plan    PT Assessment Patient does not need any further PT services  PT Problem List         PT Treatment Interventions      PT Goals (Current goals can be found in the Care Plan section)  Acute Rehab PT Goals PT Goal Formulation: All assessment and education complete, DC therapy    Frequency       Co-evaluation               AM-PAC PT "6 Clicks" Mobility  Outcome Measure Help needed turning from your back to your side while in a flat bed without using bedrails?: Total Help needed moving from lying on your back to sitting on the side of a flat bed without using bedrails?: Total Help needed moving to and from a bed to a chair (including a wheelchair)?: Total Help needed standing up from a chair using your arms (e.g., wheelchair or bedside chair)?: Total Help needed to walk in hospital room?: Total Help needed climbing 3-5 steps with a railing? : Total 6 Click Score: 6    End of Session   Activity Tolerance: Other (comment) (Session limited by cognitive deficits) Patient left: in bed;with call bell/phone within reach;with bed alarm set;with nursing/sitter in room Nurse Communication: Mobility status PT Visit Diagnosis: Muscle weakness (generalized) (M62.81)    Time: 7915-0569 PT Time Calculation (min) (ACUTE ONLY): 16 min   Charges:   PT Evaluation $PT Eval Low Complexity: 1 Low        D. Royetta Asal PT, DPT 12/21/21, 5:10 PM

## 2021-12-21 NOTE — Progress Notes (Signed)
Initial Nutrition Assessment  DOCUMENTATION CODES:   Non-severe (moderate) malnutrition in context of chronic illness  INTERVENTION:   Boost Breeze po TID, each supplement provides 250 kcal and 9 grams of protein  Ensure Enlive po TID with diet advancement, each supplement provides 350 kcal and 20 grams of protein.  Magic cup TID with diet advancement, each supplement provides 290 kcal and 9 grams of protein  MVI po daily   Pt at high refeed risk; recommend monitor potassium, magnesium and phosphorus labs daily until stable  Assistance with meals   NUTRITION DIAGNOSIS:   Moderate Malnutrition related to chronic illness as evidenced by mild fat depletion, moderate muscle depletion.  GOAL:   Patient will meet greater than or equal to 90% of their needs  MONITOR:   PO intake, Supplement acceptance, Labs, Weight trends, Skin, I & O's  REASON FOR ASSESSMENT:   Malnutrition Screening Tool    ASSESSMENT:   81 y/o male with h/o prostate cancer, BPH, HTN, CAD, dementia, depression, CHF and lives in a group home who is admitted with abdominal pain and diarrhea.  Visited pt's room today. Pt is unable to provide any history r/t dementia. Family member at bedside reports pt with fairly good appetite and oral intake at baseline. Pt does drink Boost and Ensure supplements at home. Family member reports that pt did eat some of his clear liquid meal tray for lunch. Pt does require assistance with meals. RD will add supplements and MVI to help pt meet his estimated needs. Pt is at high refeed risk. Per chart, pt is down ~43lbs over the past several years. Pt is down 10lbs(6%) over the past 3 months; this is not significant.   Medications reviewed and include: vitamin C, aspirin, B12, colace, heparin, melatonin, MVI, protonix, miralax, Kcl, NaCl '@75ml'$ /hr, ceftriaxone, metronidazole   Labs reviewed: K 3.1(L) Mg 2.0 wnl- 9/24  NUTRITION - FOCUSED PHYSICAL EXAM:  Flowsheet Row Most Recent  Value  Orbital Region No depletion  Upper Arm Region Mild depletion  Thoracic and Lumbar Region Mild depletion  Buccal Region No depletion  Temple Region Mild depletion  Clavicle Bone Region Moderate depletion  Clavicle and Acromion Bone Region Moderate depletion  Scapular Bone Region Moderate depletion  Dorsal Hand Moderate depletion  Patellar Region Moderate depletion  Anterior Thigh Region Moderate depletion  Posterior Calf Region Moderate depletion  Edema (RD Assessment) None  Hair Reviewed  Eyes Reviewed  Mouth Reviewed  Skin Reviewed  Nails Reviewed   Diet Order:   Diet Order             Diet clear liquid Room service appropriate? Yes; Fluid consistency: Thin  Diet effective now                  EDUCATION NEEDS:   No education needs have been identified at this time  Skin:  Skin Assessment: Reviewed RN Assessment  Last BM:  9/24- type 7  Height:   Ht Readings from Last 1 Encounters:  12/20/21 6' (1.829 m)    Weight:   Wt Readings from Last 1 Encounters:  12/20/21 80.6 kg    Ideal Body Weight:  80.9 kg  BMI:  Body mass index is 24.1 kg/m.  Estimated Nutritional Needs:   Kcal:  2000-2300kcal/day  Protein:  100-115g/day  Fluid:  2.0-2.3L/day  Koleen Distance MS, RD, LDN Please refer to [facility] for RD and/or RD on-call/weekend/after hours pager

## 2021-12-21 NOTE — Plan of Care (Signed)

## 2021-12-21 NOTE — Progress Notes (Signed)
[facility] SURGICAL PROGRESS NOTE (cpt [phone])  Hospital Day(s): 1.   Interval History: Patient seen and examined, no acute events or new complaints overnight. Patient with significant dementia which limits history. RN and caregivers at bedside report he is doing well; no pain. He did have small BM yesterday. He remains without leukocytosis; WBC 5.7K. Hgb sable at 12.8. Renal function is normal; sCr - 1.01; UO - unmeasured. Hypokalemia to 3.1. KUB this morning with stable colonic appearance.   Review of Systems:  Unable to reliably preform secondary to dementia   Vital signs in last 24 hours: [min-max] current  Temp:  [97.4 F (36.3 C)-98.7 F (37.1 C)] 98 F (36.7 C) (09/25 2330) Pulse Rate:  [71-78] 72 (09/25 0608) Resp:  [16-18] 16 (09/25 0762) BP: (126-164)/(77-107) 126/77 (09/25 2633) SpO2:  [97 %-100 %] 100 % (09/25 3545)     Height: 6' (182.9 cm) Weight: 80.6 kg BMI (Calculated): 24.09   Intake/Output last 2 shifts:  09/24 0701 - 09/25 0700 In: 1664.5 [I.V.:1501.3; IV Piggyback:163.2] Out: -    Physical Exam:  Constitutional: alert, cooperative and no distress  HENT: normocephalic without obvious abnormality  Eyes: PERRL, EOM's grossly intact and symmetric  Respiratory: breathing non-labored at rest  Cardiovascular: regular rate and sinus rhythm  Gastrointestinal: Abdomen is soft, tympanic, mild distension. Although dementia limits reliability of exam, he does not appear overtly tender.    Labs:     Latest Ref Rng & Units 12/21/2021    6:07 AM 12/20/2021   12:15 AM 08/29/2021    7:25 PM  CBC  WBC 4.0 - 10.5 K/uL 5.7  7.8  11.2   Hemoglobin 13.0 - 17.0 g/dL 12.8  14.0  14.6   Hematocrit 39.0 - 52.0 % 38.5  42.4  44.8   Platelets 150 - 400 K/uL 126  173  155       Latest Ref Rng & Units 12/21/2021    6:07 AM 12/20/2021   12:15 AM 08/29/2021    7:25 PM  CMP  Glucose 70 - 99 mg/dL 93  117  116   BUN 8 - 23 mg/dL '12  18  17   '$ Creatinine 0.61 - 1.24  mg/dL 1.01  1.14  1.71   Sodium 135 - 145 mmol/L 145  143  141   Potassium 3.5 - 5.1 mmol/L 3.1  2.8  3.1   Chloride 98 - 111 mmol/L 109  105  102   CO2 22 - 32 mmol/L '29  29  30   '$ Calcium 8.9 - 10.3 mg/dL 8.9  9.2  9.1   Total Protein 6.5 - 8.1 g/dL  7.4  8.5   Total Bilirubin 0.3 - 1.2 mg/dL  0.7  1.1   Alkaline Phos 38 - 126 U/L  78  85   AST 15 - 41 U/L  18  35   ALT 0 - 44 U/L  8  14      Imaging studies:   KUB (12/21/2021) personally reviewed which shows stable appearance of colonic distension, and radiologist report reviewed below:  IMPRESSION: 1. Unchanged colonic distension. 2. Low lung volumes with mild bibasilar and right mid lung opacities, likely atelectasis although early infection is not excluded.   Assessment/Plan: (ICD-10's: K59.00) 81 y.o. male with colonic pseudo obstruction, question Ogilvie Syndrome, no evidence of mechanical obstruction, complicated by significant dementia, deconditioning   - Again, etiology is not clear cut but there does not appear to be any significant mechanical obstruction, ?  Possible fecal impaction, ? Hypokalemia. Fortunately, he seems clinically stable without any evidence of peritonitis although examination is of course limited given his dementia. No surgical intervention warranted at this time. Additionally, given his significant dementia and comorbid conditions he is a sub-optimal surgical candidate.   - Agree palliative care consult is reasonable for GOC  - Could consider rectal decompression; however, I doubt he will tolerate this in setting of dementia.   - I think it would be reasonable to trial CLD  - Monitor abdominal examination; on-oing bowel function  - Serial KUBs as needed given lack of reliable phyical examination  - Bowel regimen +/- disimpaction  - Pain control prn; would avoid/limit narcotics  - Mobilization as feasible  - Further management per primary service   All of the above findings and recommendations were  discussed with the patient's caregivers at bedside, and the medical team.   -- Edison Simon, PA-C [facility] 12/21/2021, 7:41 AM M-F: 7am - 4pm

## 2021-12-22 DIAGNOSIS — R1084 Generalized abdominal pain: Secondary | ICD-10-CM | POA: Diagnosis not present

## 2021-12-22 DIAGNOSIS — Z515 Encounter for palliative care: Secondary | ICD-10-CM

## 2021-12-22 DIAGNOSIS — E876 Hypokalemia: Secondary | ICD-10-CM | POA: Diagnosis not present

## 2021-12-22 DIAGNOSIS — K5641 Fecal impaction: Secondary | ICD-10-CM

## 2021-12-22 DIAGNOSIS — Z66 Do not resuscitate: Secondary | ICD-10-CM

## 2021-12-22 DIAGNOSIS — G309 Alzheimer's disease, unspecified: Secondary | ICD-10-CM

## 2021-12-22 DIAGNOSIS — F0280 Dementia in other diseases classified elsewhere without behavioral disturbance: Secondary | ICD-10-CM

## 2021-12-22 DIAGNOSIS — E440 Moderate protein-calorie malnutrition: Secondary | ICD-10-CM | POA: Insufficient documentation

## 2021-12-22 DIAGNOSIS — I5032 Chronic diastolic (congestive) heart failure: Secondary | ICD-10-CM | POA: Diagnosis not present

## 2021-12-22 DIAGNOSIS — F03C11 Unspecified dementia, severe, with agitation: Secondary | ICD-10-CM | POA: Diagnosis not present

## 2021-12-22 LAB — BASIC METABOLIC PANEL
Anion gap: 8 (ref 5–15)
BUN: 9 mg/dL (ref 8–23)
CO2: 27 mmol/L (ref 22–32)
Calcium: 9 mg/dL (ref 8.9–10.3)
Chloride: 104 mmol/L (ref 98–111)
Creatinine, Ser: 0.96 mg/dL (ref 0.61–1.24)
GFR, Estimated: >60 mL/min (ref >60)
Glucose, Bld: 88 mg/dL (ref 70–99)
Potassium: 2.9 mmol/L — ABNORMAL LOW (ref 3.5–5.1)
Sodium: 139 mmol/L (ref 135–145)

## 2021-12-22 LAB — CBC
HCT: 41.9 % (ref 39.0–52.0)
Hemoglobin: 13.9 g/dL (ref 13.0–17.0)
MCH: 29.9 pg (ref 26.0–34.0)
MCHC: 33.2 g/dL (ref 30.0–36.0)
MCV: 90.1 fL (ref 80.0–100.0)
Platelets: 144 10*3/uL — ABNORMAL LOW (ref 150–400)
RBC: 4.65 MIL/uL (ref 4.22–5.81)
RDW: 13.4 % (ref 11.5–15.5)
WBC: 5.6 10*3/uL (ref 4.0–10.5)
nRBC: 0 % (ref 0.0–0.2)

## 2021-12-22 LAB — MAGNESIUM: Magnesium: 1.9 mg/dL (ref 1.7–2.4)

## 2021-12-22 LAB — GLUCOSE, CAPILLARY: Glucose-Capillary: 71 mg/dL (ref 70–99)

## 2021-12-22 LAB — PHOSPHORUS: Phosphorus: 2.7 mg/dL (ref 2.5–4.6)

## 2021-12-22 MED ORDER — MAGNESIUM SULFATE 2 GM/50ML IV SOLN
2.0000 g | Freq: Once | INTRAVENOUS | Status: AC
  Administered 2021-12-22: 2 g via INTRAVENOUS
  Filled 2021-12-22: qty 50

## 2021-12-22 MED ORDER — POTASSIUM CHLORIDE 20 MEQ PO PACK
80.0000 meq | PACK | Freq: Once | ORAL | Status: AC
  Administered 2021-12-22: 80 meq via ORAL
  Filled 2021-12-22: qty 4

## 2021-12-22 NOTE — Progress Notes (Signed)
PROGRESS NOTE   HPI was taken from Dr. Blaine Hamper: Alex [name] is a 81 y.o. male with medical history significant of perforated gastric ulcer, mesenteric ischemia, hypertension, hyperlipidemia, GERD, depression, dementia, prostate cancer, BPH, CAD with stent placement, dCHF, who presents with abdominal pain.     Pt has dementia, and cannot provide accurate medical history.  I called her wife by phone.  Per his wife, patient developed abdominal pain since yesterday, which seem to be diffuse abdominal pain.  Also has 1 time of loose stool bowel movement.  No nausea and vomiting.  Patient denies chest pain, cough, shortness breath.  Denies symptoms of UTI.  Per his wife, at her normal baseline, patient sometimes recognizes family members, but most of the time he is not orientated to the place and time.  Patient seems to be more confused in the past several days per his wife.  Patient is less reactive than baseline.   Data reviewed independently and ED Course: pt was found to have WBC 7.8, lactic acid 1.6, BNP 55.8, potassium 2.8, magnesium 2.0, GFR> 60, temperature 98.1, 96.0, blood pressure 169/85, heart rate 73, RR 16.  Patient is admitted to [place] bed as inpatient.  Dr. Dahlia Byes of surgery is consulted.   EKG: I have personally reviewed.  Sinus rhythm, QTc 547, Q wave in lead III, early R wave progression, nonspecific T wave change   CT-abd/pelvis showed: Increased marked distention of the large bowel greatest about the sigmoid colon when compared with 08/29/2021. There is hyperdense layering stool within the rectum. Fecal impaction can not be excluded.   Wall thickening about the rectum suggestive of proctitis.   Large right-sided hydrocele.   Aortic Atherosclerosis (ICD10-I70.0).     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 Continue on IVFs. Not a good surgical candidate as per gen surg. Small bowel movement today but will continue on bowel regimen. Gen surg recs apprec    Possible proctitis: continue on IV flagyl, rocephin  Generalized weakness: not walked in approx 1 month as per pt's mother. Not a candidate for SNF b/c pt's significant dementia    HTN: continue on CCB. Hydralazine prn    CAD: no chest pain. Continue on statin, aspirin    HLD: continue on statin    Chronic diastolic CHF: echo on 09/29/5007 showed EF 75% with grade 1 diastolic dysfunction. CHF appears compensated. Monitor I/Os    Hypokalemia: potassium & Mg given    Hypothermia: resolved    BPH: continue on home dose of finasteride    Dementia: continue w/ supportive care. Seroquel prn for agitation.    Depression: severity unknown. Continue on home dose of duloxetine   Thrombocytopenia: etiology unclear, labile     DVT prophylaxis: heparin SQ Code Status: DNR Family Communication: discussed pt's care w/ pt's wife, [name], and answered her questions Disposition Plan: possibly d/c back home. Palliative care consulted  Level of care: Med-Surg  Status is: Inpatient Remains inpatient appropriate  because: severity of illness    Consultants:  Gen surg Palliative care  Procedures:   Antimicrobials: rocephin, flagyl   Subjective: Pt is sleeping.   Objective: Vitals:   12/21/21 1952 12/22/21 0500 12/22/21 0532 12/22/21 0748  BP: (!) 140/98  (!) 160/110 (!) 154/96  Pulse: 66  71 70  Resp: '20  18 18  '$ Temp: 97.6 F (36.4  C)  97.6 F (36.4 C) 98 F (36.7 C)  TempSrc:    Oral  SpO2: 96%  99% 99%  Weight:  82.4 kg    Height:        Intake/Output Summary (Last 24 hours) at 12/22/2021 0819 Last data filed at 12/22/2021 0092 Gross per 24 hour  Intake 1299.65 ml  Output --  Net 1299.65 ml   Filed Weights   12/20/21 0640 12/21/21 1638 12/22/21 0500  Weight: 80.6 kg 82.6 kg 82.4 kg    Examination:  General exam: Appears calm & comfortable  Respiratory system: clear breath sounds b/l  Cardiovascular system: S1/S2+. No rubs or clicks  Gastrointestinal system: Abd is soft,ND & hypoactive bowel sounds  Central nervous system: pt is sleeping  Psychiatry:  judgement and insight appears poor    Data Reviewed: I have personally reviewed following labs and imaging studies  CBC: Recent Labs  Lab 12/20/21 0015 12/21/21 0607 12/22/21 0639  WBC 7.8 5.7 5.6  NEUTROABS 4.7  --   --   HGB 14.0 12.8* 13.9  HCT 42.4 38.5* 41.9  MCV 90.8 89.5 90.1  PLT 173 126* 330*   Basic Metabolic Panel: Recent Labs  Lab 12/20/21 0015 12/21/21 0607 12/22/21 0639  NA 143 145 139  K 2.8* 3.1* 2.9*  CL 105 109 104  CO2 '29 29 27  '$ GLUCOSE 117* 93 88  BUN '18 12 9  '$ CREATININE 1.14 1.01 0.96  CALCIUM 9.2 8.9 9.0  MG 2.0  --  1.9  PHOS  --   --  2.7   GFR: Estimated Creatinine Clearance: 66.2 mL/min (by C-G formula based on SCr of 0.96 mg/dL). Liver Function Tests: Recent Labs  Lab 12/20/21 0015  AST 18  ALT 8  ALKPHOS 78  BILITOT 0.7  PROT 7.4  ALBUMIN 4.0   Recent Labs  Lab 12/20/21 0015  LIPASE 41   No results for input(s): "AMMONIA" in the last 168 hours. Coagulation Profile: Recent Labs  Lab 12/20/21 0859  INR 1.1   Cardiac Enzymes: No results for input(s): "CKTOTAL", "CKMB", "CKMBINDEX", "TROPONINI" in the last 168 hours. BNP (last 3 results) No results for input(s): "PROBNP" in the last 8760 hours. HbA1C: No results for input(s): "HGBA1C" in the last 72 hours. CBG: Recent Labs  Lab  12/22/21 0745  GLUCAP 71   Lipid Profile: No results for input(s): "CHOL", "HDL", "LDLCALC", "TRIG", "CHOLHDL", "LDLDIRECT" in the last 72 hours. Thyroid Function Tests: No results for input(s): "TSH", "T4TOTAL", "FREET4", "T3FREE", "THYROIDAB" in the last 72 hours. Anemia Panel: No results for input(s): "VITAMINB12", "FOLATE", "FERRITIN", "TIBC", "IRON", "RETICCTPCT" in the last 72 hours. Sepsis Labs: Recent Labs  Lab 12/20/21 0015  PROCALCITON <0.10  LATICACIDVEN 1.6    No results found for this or any previous visit (from the past 240 hour(s)).       Radiology Studies: DG ABD ACUTE 2+V W 1V CHEST  Result Date: 12/21/2021 CLINICAL DATA:  Ogilvie syndrome. EXAM: DG ABDOMEN ACUTE WITH 1 VIEW CHEST COMPARISON:  CT abdomen and pelvis 12/20/2021. Chest radiographs 07/21/2019. FINDINGS: The cardiomediastinal silhouette is within normal  limits. Lung volumes are low with asymmetric elevation of the left hemidiaphragm. Mild opacities are present in the lung bases and right mid lung. No sizable pleural effusion or pneumothorax is identified. No intraperitoneal free air is identified. Gaseous distension of the colon, greatest in the region of the sigmoid colon, is similar to the recent CT. A small to moderate amount of stool is present in the colon and rectum. No significant small bowel dilatation is evident. No acute osseous abnormality is seen. IMPRESSION: 1. Unchanged colonic distension. 2. Low lung volumes with mild bibasilar and right mid lung opacities, likely atelectasis although early infection is not excluded. Electronically Signed   By: Logan Bores M.D.   On: 12/21/2021 08:22        Scheduled Meds:  amLODipine  5 mg Oral Daily   ascorbic acid  500 mg Oral Daily   aspirin EC  81 mg Oral Daily   cyanocobalamin  1,000 mcg Oral Daily   docusate sodium  200 mg Oral BID   DULoxetine  30 mg Oral Daily   feeding supplement  1 Container Oral TID BM   finasteride  5 mg Oral Daily    heparin  5,000 Units Subcutaneous Q8H   melatonin  5 mg Oral QHS   multivitamin with minerals  1 tablet Oral Daily   pantoprazole  20 mg Oral Daily   polyethylene glycol  17 g Oral Daily   pravastatin  80 mg Oral Daily   Continuous Infusions:  sodium chloride Stopped (12/22/21 0534)   cefTRIAXone (ROCEPHIN)  IV Stopped (12/21/21 1228)   metronidazole 100 mL/hr at 12/22/21 0607     LOS: 2 days    Time spent: 35 mins    Wyvonnia Dusky, MD [facility] Pager [phone]  If 7PM-7AM, please contact night-coverage [email or web address] 12/22/2021, 8:19 AM

## 2021-12-22 NOTE — TOC Initial Note (Signed)
Transition of Care [facility]) - Initial/Assessment Note    Patient Details  Name: Alex [name] MRN: [id number] Date of Birth: 01/17/1941  [facility]) CM/SW Contact:    Beverly Sessions, RN Phone Number: 12/22/2021, 4:17 PM  Clinical Narrative:                  Admitted for: abdominal pain  Admitted from: from home with wife and caregivers [facility]CP: [facility] Current home health/prior home health/DME: hospital Bed, WC, RW.  Wife requesting hoyer lift.  Referral made to [place] with [facility]  Patient has has [facility] previously and wife would like to see if they will accept him back.  PT and OT say no follow up needed.  VM left for Malachy Mood to request review for RN, aide and SW Will need EMS transport at discharge          Patient Goals and CMS Choice        Expected Discharge Plan and Services                                                Prior Living Arrangements/Services                       Activities of Daily Living Home Assistive Devices/Equipment: Environmental consultant (specify type) ADL Screening (condition at time of admission) Patient's cognitive ability adequate to safely complete daily activities?: No Is the patient deaf or have difficulty hearing?: No Does the patient have difficulty seeing, even when wearing glasses/contacts?: No Does the patient have difficulty concentrating, remembering, or making decisions?: Yes Patient able to express need for assistance with ADLs?: No Does the patient have difficulty dressing or bathing?: Yes Independently performs ADLs?: No Communication: Independent Dressing (OT): Dependent Is this a change from baseline?: Pre-admission baseline Grooming: Dependent Is this a change from baseline?: Pre-admission baseline Feeding: Dependent Is this a change from baseline?: Pre-admission baseline Bathing: Dependent Is this a change from baseline?: Pre-admission baseline Toileting: Dependent Is this a change from  baseline?: Pre-admission baseline In/Out Bed: Dependent Is this a change from baseline?: Pre-admission baseline Walks in Home: Dependent Is this a change from baseline?: Pre-admission baseline Does the patient have difficulty walking or climbing stairs?: Yes Weakness of Legs: Both Weakness of Arms/Hands: Both  Permission Sought/Granted                  Emotional Assessment              Admission diagnosis:  Fecal impaction (HCC) [K56.41] Proctitis [K62.89] Generalized abdominal pain [R10.84] Obstipation [K59.00] Abdominal pain [R10.9] Alzheimer's dementia, unspecified dementia severity, unspecified timing of dementia onset, unspecified whether behavioral, psychotic, or mood disturbance or anxiety ([place]) [G30.9, F02.80] Patient Active Problem List   Diagnosis Date Noted   Malnutrition of moderate degree 12/22/2021   Abdominal pain 12/20/2021   Hypertension    CAD (coronary artery disease)    Dementia (HCC)    BPH (benign prostatic hyperplasia)    HLD (hyperlipidemia)    Depression    Chronic diastolic CHF (congestive heart failure) ([place])    Proctitis    Hypokalemia    Hypothermia    Obstipation    Acute mesenteric ischemia ([place])    Ambulatory dysfunction 11/29/2017   Pneumatosis intestinalis    Perforated gastric ulcer ([place]) 11/21/2017   Chest pain  06/27/2015   Uncontrolled hypertension 06/27/2015   Bradycardia 06/27/2015   Pleural thickening 06/27/2015   PCP:  [facility] [place]:   [id number] [place] [place] [address] [place]Phone: [phone]: [phone] [facility] [place]Phone: [phone]: [phone] [id number] [address]one: [phone]: [phone]   Social Determinants of Health  (SDOH) Interventions    Readmission Risk Interventions     No data to display

## 2021-12-22 NOTE — Progress Notes (Signed)
Patient requires maximum assistance for all transfers due to dementia

## 2021-12-22 NOTE — Consult Note (Signed)
Consultation Note Date: 12/22/2021   Patient Name: Alex [name]  DOB: [birth year]  MRN: [id number]  Age / Sex: 81 y.o., male  PCP: [facility] [place] Referring Physician: Wyvonnia Dusky, MD  Reason for Consultation: Establishing goals of care  HPI/Patient Profile: 81 y.o. male  with past medical history of dementia, HTN, CAD, HLD, chronic diastolic CHF, BPH, depression, and 2 ED visits in the last 3 months (6/4 for abdominal pain, 7/22 from fall) admitted on 12/20/2021 with abdominal pain.  Etiology of patient's abdominal pain is unclear.  General surgery has been consulted.  Clinical Assessment and Goals of Care: I have reviewed medical records including EPIC notes, labs and imaging, assessed the patient and then met with patient, his wife [name], and his caregiver Inez Catalina at bedside to discuss diagnosis prognosis, GOC, EOL wishes, disposition and options.  I introduced Palliative Medicine as specialized medical care for people living with serious illness. It focuses on providing relief from the symptoms and stress of a serious illness. The goal is to improve quality of life for both the patient and the family.  We discussed a brief life review of the patient.  Patient and wife are Carlyle Basques had been married for the second time to 1 another for approximately 5 years.  (Divorced for 71 years between marriages). 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  As far as functional and nutritional status patient's wife endorses a significant decline in patient's mobility over the last month.  He has not been able to walk or move out of the bed independently.  She endorses he has a healthy appetite when his food is pured and fed to him. However, she has noticed he has lost a significant amount of weight over the past several months. She shares she "knows he is going  downhill".   We discussed patient's current illness and what it means in the larger context of patient's on-going co-morbidities.  Progressive, chronic, and irreversible nature of dementia discussed.  Also reviewed additional acute problem of abdominal pain with potential diagnoses of prostatitis versus Ogilvie's.   I attempted to elicit values and goals of care important to the patient.  Wife shares she would like to ensure and antibiotics can help clear patient's current stomach issues.  However, she understands that perhaps this would not resolve his issues.  She shares she knows that there are some things that cannot be fixed and that something for anxiety hands.  The difference between aggressive medical intervention and comfort care was considered in light of the patient's goals of care. Education offered regarding concept specific to human mortality and the limitations of medical interventions to prolong life when the body begins to fail to thrive.   [name] shares she would like to treat the treatable during this hospitalization, take the patient home, and not have him return to the hospital again.  In light of this statement, we discussed palliative outpatient services as well as hospice in the home.  Procedure she  has a "wonderful staff at home" that help her with patient's ADLs.  However, I outlined that in addition to every day cares that hospice provides a holistic approach to a dignified, compassionate and life care.  Reviewed that the patient wishes to age in place and not return to hospital for medical treatment that an acting hospice benefits could be beneficial.  [name] shares she appreciates this recommendation.    Time for outcomes needed.  PMT will continue to follow the patient throughout his hospitalization.  Discussed with patient/family the importance of continued conversation with family and the medical providers regarding overall plan of care and treatment options,  ensuring decisions are within the context of the patient's values and GOCs.    Primary Decision Maker NEXT OF KIN  Code Status/Advance Care Planning: DNR  Discharge Planning: To Be Determined  Primary Diagnoses: Present on Admission:  Abdominal pain  Hypertension  CAD (coronary artery disease)  Dementia (HCC)  BPH (benign prostatic hyperplasia)  HLD (hyperlipidemia)  Depression  Chronic diastolic CHF (congestive heart failure) (HCC)  Proctitis  Hypokalemia  Hypothermia   Physical Exam Vitals reviewed.  Constitutional:      General: He is not in acute distress.    Appearance: He is well-developed. He is not ill-appearing.  HENT:     Head: Normocephalic.  Cardiovascular:     Rate and Rhythm: Normal rate.  Pulmonary:     Effort: Pulmonary effort is normal.  Abdominal:     Palpations: Abdomen is soft.  Skin:    General: Skin is warm and dry.  Neurological:     Comments: Oriented to self but not to place, time, situation  Psychiatric:        Mood and Affect: Mood is not anxious.     Palliative Assessment/Data: 30%     Thank you for this consult. Palliative medicine will continue to follow and assist holistically.   Time Total: 75 minutes Greater than 50%  of this time was spent counseling and coordinating care related to the above assessment and plan.  Signed by: Jordan Hawks, DNP, FNP-BC Palliative Medicine    Please contact Palliative Medicine Team phone at [phone] for questions and concerns.  For individual provider: See Shea Evans

## 2021-12-22 NOTE — Progress Notes (Signed)
OT Cancellation Note  Patient Details Name: Alex [name] MRN: [id number] DOB: 1941-01-12   Cancelled Treatment:    Reason Eval/Treat Not Completed: OT screened, no needs identified, will sign off. Consult received, chart reviewed. Upon attempt, spouse present and sitter from pt's home. Pt sleeping soundly. Spouse educated in role of acute OT. Spouse reports pt requires total assist/dependent with most ADL/mobility at home. Sometimes able to wash his face and feed himself but other times requires assist for that as well. Spouse declines OT services at this time. Will sign off.   Ardeth Perfect., MPH, MS, OTR/L ascom [phone] 12/22/21, 11:52 AM

## 2021-12-23 DIAGNOSIS — I5032 Chronic diastolic (congestive) heart failure: Secondary | ICD-10-CM | POA: Diagnosis not present

## 2021-12-23 DIAGNOSIS — R1084 Generalized abdominal pain: Secondary | ICD-10-CM | POA: Diagnosis not present

## 2021-12-23 DIAGNOSIS — G309 Alzheimer's disease, unspecified: Secondary | ICD-10-CM | POA: Diagnosis not present

## 2021-12-23 DIAGNOSIS — F03C11 Unspecified dementia, severe, with agitation: Secondary | ICD-10-CM | POA: Diagnosis not present

## 2021-12-23 LAB — BASIC METABOLIC PANEL
Anion gap: 7 (ref 5–15)
BUN: 11 mg/dL (ref 8–23)
CO2: 27 mmol/L (ref 22–32)
Calcium: 8.6 mg/dL — ABNORMAL LOW (ref 8.9–10.3)
Chloride: 107 mmol/L (ref 98–111)
Creatinine, Ser: 1.11 mg/dL (ref 0.61–1.24)
GFR, Estimated: >60 mL/min (ref >60)
Glucose, Bld: 98 mg/dL (ref 70–99)
Potassium: 3 mmol/L — ABNORMAL LOW (ref 3.5–5.1)
Sodium: 141 mmol/L (ref 135–145)

## 2021-12-23 LAB — GLUCOSE, CAPILLARY: Glucose-Capillary: 91 mg/dL (ref 70–99)

## 2021-12-23 LAB — CBC
HCT: 38.8 % — ABNORMAL LOW (ref 39.0–52.0)
Hemoglobin: 13 g/dL (ref 13.0–17.0)
MCH: 30.3 pg (ref 26.0–34.0)
MCHC: 33.5 g/dL (ref 30.0–36.0)
MCV: 90.4 fL (ref 80.0–100.0)
Platelets: 138 10*3/uL — ABNORMAL LOW (ref 150–400)
RBC: 4.29 MIL/uL (ref 4.22–5.81)
RDW: 13.8 % (ref 11.5–15.5)
WBC: 5.4 10*3/uL (ref 4.0–10.5)
nRBC: 0 % (ref 0.0–0.2)

## 2021-12-23 LAB — MAGNESIUM: Magnesium: 1.8 mg/dL (ref 1.7–2.4)

## 2021-12-23 LAB — PHOSPHORUS: Phosphorus: 3.2 mg/dL (ref 2.5–4.6)

## 2021-12-23 NOTE — Hospital Course (Addendum)
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  Seen by general surgery who recommended fecal disimpaction, but no surgical intervention.  Patient seen by palliative care, with patient's wife wishing to treat immediate problems and take home as soon as she can.

## 2021-12-23 NOTE — Assessment & Plan Note (Signed)
Nutrition Status: Nutrition Problem: Moderate Malnutrition Etiology: chronic illness Signs/Symptoms: mild fat depletion, moderate muscle depletion    Currently on clear liquids until cleared.  Seen by nutrition.

## 2021-12-23 NOTE — Progress Notes (Signed)
                                                     Palliative Care Progress Note, Assessment & Plan   Patient Name: Alex [name]       Date: 12/23/2021 DOB: [birth year]  Age: 81 y.o. MRN#: [id number] Attending Physician: Annita Brod, MD Primary Care Physician: [facility] Admit Date: 12/20/2021  Reason for Consultation/Follow-up: Establishing goals of care  Subjective: Patient is lying in bed resting and does not awaken to my presence.  Brothers and sisters are at bedside.  Respirations are even and unlabored.  Pulses are palpable in all extremities.  Patient has no signs of distress at this time.  HPI: 81 y.o. male  with past medical history of dementia, HTN, CAD, HLD, chronic diastolic CHF, BPH, depression, and 2 ED visits in the last 3 months (6/4 for abdominal pain, 7/22 from fall) admitted on 12/20/2021 with abdominal pain.   Etiology of patient's abdominal pain is unclear.  General surgery has been consulted.  Summary of counseling/coordination of care: After reviewing the patient's chart and assessing the patient at bedside, I spoke with patient's family (brothers and sisters) and caregiver Inez Catalina at bedside in regards to prognosis, diagnosis, plan of care.    Family present asks with appropriate questions regarding obstruction and treatment.  Education provided on dementia as a chronic and irreversible disease that has now been complicated by the acute problem of abdominal pain with unclear diagnosis/etiology.  Education provided on palliative team's role in patient's care - to address goals of care and assist in management of patient's symptoms to minimize suffering.   Family was concerned with patient's nutritional intake.  Reviewed use of IV fluids and clear liquid diet to maintain hydration for patient.  Discussed patient's  clear liquid diet and need for further work-up of abdominal issues before advancing diet.  Therapeutic silence and active listening provided for family to share thoughts and emotions regarding current medical situation.  Emotional support provided.  Questions and concerns were addressed.  PMT will continue to follow the patient throughout his hospitalization.  Code Status: DNR  Prognosis: Unable to determine  Discharge Planning: To Be Determined  Physical Exam Vitals reviewed.  Constitutional:      General: He is not in acute distress.    Appearance: He is not ill-appearing or toxic-appearing.  HENT:     Head: Normocephalic.  Cardiovascular:     Rate and Rhythm: Normal rate.  Pulmonary:     Effort: Pulmonary effort is normal.  Abdominal:     Palpations: Abdomen is soft.  Skin:    General: Skin is warm and dry.             Palliative Assessment/Data: 30%    Total Time 25 minutes  Greater than 50%  of this time was spent counseling and coordinating care related to the above assessment and plan.  Thank you for allowing the Palliative Medicine Team to assist in the care of this patient.  [place] Ilsa Iha, FNP-BC Palliative Medicine Team Team Phone # [phone]

## 2021-12-23 NOTE — Progress Notes (Signed)
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  Seen by general surgery who recommended fecal disimpaction, but no surgical intervention.  Patient seen by palliative care, with patient's wife wishing to treat immediate problems and take home as soon as she can.  Assessment and Plan: Assessment and Plan: Obstipation Recurrent.  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  Trying to manage conservatively.  Will discuss with GI.  No surgical intervention recommended at this time.  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  Patient does not have leg edema JVD.  CHF is compensated.  BNP 55.8. -Watch volume status status closely  Hypertension - IV hydralazine as needed -Amlodipine  Hypokalemia Potassium 2.8.  Magnesium 2.0 -Repleted potassium  CAD (coronary artery disease) S/o of stent. No CP -ASA -Pravastatin  Hypothermia-resolved as of 12/23/2021 Secondary to illness.  Resolved.  HLD (hyperlipidemia) - Pravastatin  BPH (benign prostatic hyperplasia) - Proscar  Senile dementia with behavioral disturbance ([place]) Initially on admission, slightly more confused than baseline.  Likely secondary to underlying medical issues as above.  Since then, has been more calm.  Malnutrition of moderate degree Nutrition Status: Nutrition Problem: Moderate Malnutrition Etiology: chronic illness Signs/Symptoms: mild fat depletion, moderate muscle depletion    Currently on clear liquids until  cleared.  Seen by nutrition.  Depression - Cymbalta       Body mass index is 24.58 kg/m.  Nutrition Problem: Moderate Malnutrition Etiology: chronic illness     Consultants: Palliative care General surgery Gastroenterology  Procedures: None  Antimicrobials: IV Flagyl and Rocephin 9/24-present  Code Status: DNR   Subjective: Patient pleasantly confused  Objective: Vital signs were reviewed and unremarkable. Vitals:   12/23/21 0751 12/23/21 1442  BP: 115/84 (!) 132/90  Pulse: 72 74  Resp: 14 17  Temp: 97.7 F (36.5 C) 97.6 F (36.4 C)  SpO2: 97% 99%    Intake/Output Summary (Last 24 hours) at 12/23/2021 1745 Last data filed at 12/23/2021 1442 Gross per 24 hour  Intake 1089.78 ml  Output --  Net 1089.78 ml   Filed Weights   12/21/21 1638 12/22/21 0500 12/23/21 0453  Weight: 82.6 kg 82.4 kg 82.2 kg   Body mass index is 24.58 kg/m.  Exam:  General: Pleasantly confused, no acute distress HEENT: Normocephalic and atraumatic, mucous membranes are moist Cardiovascular: Regular rate and rhythm, S1-S2 Respiratory: Clear to auscultation bilaterally Abdomen: Soft, distended, nontender, hypoactive bowel sounds Musculoskeletal: No clubbing or cyanosis, trace pitting edema Skin: Skin breaks, tears or lesions Psychiatry: Chronic underlying dementia, no acute psychoses Neurology: No focal deficits  Data Reviewed: Potassium of 3.0  Disposition:  Status is: Inpatient Remains inpatient appropriate because:  -Treatment of underlying issues so patient can be discharged home    Anticipated discharge date: 9/28  Family Communication: Wife at the bedside DVT Prophylaxis: heparin injection 5,000 Units Start: 12/20/21 1200    Author: Annita Brod ,MD 12/23/2021 5:45 PM  To reach On-call, see care teams to locate the attending and reach out via [email or web address]. Between 7PM-7AM, please contact night-coverage If  you still have difficulty reaching the  attending provider, please page the [facility] (Director on Call) for [facility] on amion for assistance.

## 2021-12-23 NOTE — Care Management Important Message (Signed)
Important Message  Patient Details  Name: Alex [name] MRN: [id number] Date of Birth: 12-Aug-1940   Medicare Important Message Given:  Yes     Dannette Barbara 12/23/2021, 11:25 AM

## 2021-12-23 NOTE — Plan of Care (Signed)

## 2021-12-23 NOTE — Assessment & Plan Note (Addendum)
Recurrent.  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  Trying to manage conservatively.  Will discuss with GI.  No surgical intervention recommended at this time.

## 2021-12-23 NOTE — Progress Notes (Signed)
Pt refused blood sugar check this morning.

## 2021-12-24 DIAGNOSIS — R1084 Generalized abdominal pain: Secondary | ICD-10-CM | POA: Diagnosis not present

## 2021-12-24 DIAGNOSIS — E440 Moderate protein-calorie malnutrition: Secondary | ICD-10-CM

## 2021-12-24 DIAGNOSIS — G309 Alzheimer's disease, unspecified: Secondary | ICD-10-CM | POA: Diagnosis not present

## 2021-12-24 DIAGNOSIS — I5032 Chronic diastolic (congestive) heart failure: Secondary | ICD-10-CM | POA: Diagnosis not present

## 2021-12-24 LAB — MAGNESIUM: Magnesium: 1.8 mg/dL (ref 1.7–2.4)

## 2021-12-24 LAB — BASIC METABOLIC PANEL
Anion gap: 6 (ref 5–15)
BUN: 8 mg/dL (ref 8–23)
CO2: 28 mmol/L (ref 22–32)
Calcium: 8.5 mg/dL — ABNORMAL LOW (ref 8.9–10.3)
Chloride: 106 mmol/L (ref 98–111)
Creatinine, Ser: 1.04 mg/dL (ref 0.61–1.24)
GFR, Estimated: >60 mL/min (ref >60)
Glucose, Bld: 97 mg/dL (ref 70–99)
Potassium: 2.7 mmol/L — CL (ref 3.5–5.1)
Sodium: 140 mmol/L (ref 135–145)

## 2021-12-24 LAB — GLUCOSE, CAPILLARY
Glucose-Capillary: 41 mg/dL — CL (ref 70–99)
Glucose-Capillary: 97 mg/dL (ref 70–99)

## 2021-12-24 LAB — PHOSPHORUS: Phosphorus: 3.4 mg/dL (ref 2.5–4.6)

## 2021-12-24 MED ORDER — POTASSIUM CHLORIDE 20 MEQ PO PACK
40.0000 meq | PACK | Freq: Once | ORAL | Status: AC
  Administered 2021-12-24: 40 meq via ORAL
  Filled 2021-12-24: qty 2

## 2021-12-24 MED ORDER — POTASSIUM CHLORIDE CRYS ER 20 MEQ PO TBCR: 40.0000 meq | EXTENDED_RELEASE_TABLET | Freq: Once | ORAL | Status: DC

## 2021-12-24 MED ORDER — POTASSIUM CHLORIDE 10 MEQ/100ML IV SOLN
10.0000 meq | INTRAVENOUS | Status: AC
  Administered 2021-12-24 (×3): 10 meq via INTRAVENOUS
  Filled 2021-12-24 (×3): qty 100

## 2021-12-24 MED ORDER — POTASSIUM CHLORIDE 10 MEQ/100ML IV SOLN
10.0000 meq | INTRAVENOUS | Status: DC
  Administered 2021-12-24: 10 meq via INTRAVENOUS
  Filled 2021-12-24: qty 100

## 2021-12-24 NOTE — Progress Notes (Signed)
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  Seen by general surgery who recommended fecal disimpaction, but no surgical intervention.  Patient seen by palliative care, with patient's wife wishing to treat immediate problems and take home as soon as she can.  Assessment and Plan: Assessment and Plan: Obstipation Recurrent.  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  Trying to manage conservatively.  We will try nursing disimpaction and advancing diet.  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  Patient does not have leg edema JVD.  CHF is compensated.  BNP 55.8. -Watch volume status status closely  Hypertension - IV hydralazine as needed -Amlodipine  Hypokalemia Potassium 2.8.  Magnesium 2.0 -Repleted potassium  CAD (coronary artery disease) S/o of stent. No CP -ASA -Pravastatin  Hypothermia-resolved as of 12/23/2021 Secondary to illness.  Resolved.  HLD (hyperlipidemia) - Pravastatin  BPH (benign prostatic hyperplasia) - Proscar  Senile dementia with behavioral disturbance ([place]) Initially on admission, slightly more confused than baseline.  Likely secondary to underlying medical issues as above.  Since then, has been more calm.  Malnutrition of moderate degree Nutrition Status: Nutrition Problem: Moderate Malnutrition Etiology: chronic illness Signs/Symptoms: mild fat depletion, moderate muscle depletion    Currently on clear liquids  until cleared.  Seen by nutrition.  Depression - Cymbalta       Body mass index is 24.58 kg/m.  Nutrition Problem: Moderate Malnutrition Etiology: chronic illness     Consultants: Palliative care General surgery Gastroenterology  Procedures: None  Antimicrobials: IV Flagyl and Rocephin 9/24-9/29  Code Status: DNR   Subjective: Remains pleasantly confused  Objective: Vital signs were reviewed and unremarkable. Vitals:   12/24/21 0517 12/24/21 0757  BP:  (!) 145/91  Pulse: 86 77  Resp: 18 18  Temp: 98.6 F (37 C) (!) 97.4 F (36.3 C)  SpO2:  97%    Intake/Output Summary (Last 24 hours) at 12/24/2021 1657 Last data filed at 12/24/2021 1538 Gross per 24 hour  Intake 717.75 ml  Output --  Net 717.75 ml    Filed Weights   12/21/21 1638 12/22/21 0500 12/23/21 0453  Weight: 82.6 kg 82.4 kg 82.2 kg   Body mass index is 24.58 kg/m.  Exam: Same, little change from previous day General: Pleasantly confused, no acute distress HEENT: Normocephalic and atraumatic, mucous membranes are moist Cardiovascular: Regular rate and rhythm, S1-S2 Respiratory: Clear to auscultation bilaterally Abdomen: Soft, distended, nontender, hypoactive bowel sounds Musculoskeletal: No clubbing or cyanosis, trace pitting edema Skin: Skin breaks, tears or lesions Psychiatry: Chronic underlying dementia, no acute psychoses Neurology: No focal deficits  Data Reviewed: Potassium of 2.7.  Disposition:  Status is: Inpatient Remains inpatient appropriate because:  -Completes IV antibiotics on 9/29 -Trying disimpaction and advancement of diet    Anticipated discharge date: 9/29, then home with palliative care  Family Communication: Wife at the bedside DVT Prophylaxis: heparin injection 5,000 Units Start: 12/20/21 1200    Author: Annita Brod ,MD 12/24/2021 4:57 PM  To reach On-call, see care teams to locate  the attending and reach out via [email or web address]. Between  7PM-7AM, please contact night-coverage If you still have difficulty reaching the attending provider, please page the [facility] (Director on Call) for [facility] on amion for assistance.

## 2021-12-24 NOTE — TOC Progression Note (Addendum)
Transition of Care [facility]) - Progression Note    Patient Details  Name: Alex [name] MRN: [id number] Date of Birth: 03-15-1941  Transition of Care [facility]) CM/SW [place], LCSW Phone Number: 12/24/2021, 10:18 AM  Clinical Narrative: [facility] has accepted referral for RN, aide, SW. Wife is aware. She confirmed hoyer lift was delivered yesterday.    1:45 pm: Wife no longer at bedside. Called and confirmed interest in outpatient palliative services. Made referral to Venia Carbon, RN with [facility]. Wife confirmed address on facesheet is correct for EMS transport home when discharged.  Expected Discharge Plan and Services                                                 Social Determinants of Health (SDOH) Interventions    Readmission Risk Interventions     No data to display

## 2021-12-24 NOTE — Progress Notes (Signed)
Palliative Care Progress Note, Assessment & Plan   Patient Name: Alex [name]       Date: 12/24/2021 DOB: [birth year]  Age: 81 y.o. MRN#: [id number] Attending Physician: Annita Brod, MD Primary Care Physician: [facility] Admit Date: 12/20/2021  Reason for Consultation/Follow-up: Establishing goals of care  Subjective: Patient is lying in bed in no apparent distress.  He acknowledges my presence.  He is unable to make his wishes known.  Respirations are even and unlabored palpation of his belly did not reveal pain or tenderness.  Patient denies pain or discomfort at this time his wife, caregiver, and other friend are at bedside  HPI: 81 y.o. male  with past medical history of dementia, HTN, CAD, HLD, chronic diastolic CHF, BPH, depression, and 2 ED visits in the last 3 months (6/4 for abdominal pain, 7/22 from fall) admitted on 12/20/2021 with abdominal pain.   Etiology of patient's abdominal pain is unclear.  General surgery has been consulted.  Summary of counseling/coordination of care: After reviewing the patient's chart and assessing the patient at bedside, I discussed goals of care with patient's wife Alex [name].  She shares she is secured hoyer for their home and is prepared to take him home with when medically ready.  We discussed patient's current functional, nutritional, and cognitive status.    Therapeutic silence and active listening provided for Alex [name] to share thoughts and emotions regarding patient's current medical situation.  Emotional support provided.  Wife continues to say she knows that she has seen him decline and that he will continue to deteriorate.  In light of these comments, I discussed outpatient palliative services as well as hospice services again with  patient's wife.  She is accepting of additional services with palliative team.  Encouraged Alex [name] to continue to have discussions and conversations with medical team ensuring that patient's wishes and values are at forefront of medical decision making.  Questions and concerns were addressed.  [name] has PMT contact information and was encouraged to reach out if goals change, if patient's health deteriorates during hospitalization, or if other palliative needs arise during this hospitalization.  Goals are clear. DNR remains. Plan is to d/c home with outpatient palliative services.   Code Status: DNR  Prognosis: Unable to determine  Discharge Planning: Home with Palliative Services  Physical Exam Vitals reviewed.  Constitutional:      General: He is not in acute distress. HENT:     Head: Normocephalic.  Pulmonary:     Effort: Pulmonary effort is normal.  Abdominal:     Palpations: Abdomen is soft.  Skin:    General: Skin is warm and dry.  Neurological:     Mental Status: He is alert.     Comments: Oriented to self  Psychiatric:        Mood and Affect: Mood normal. Mood is not anxious.        Behavior: Behavior normal.             Palliative Assessment/Data: 30%    Total Time 25 minutes  Greater than 50%  of this time was spent counseling and coordinating care related to the above assessment and plan.  Thank you for allowing  the Palliative Medicine Team to assist in the care of this patient.  [place] Ilsa Iha, FNP-BC Palliative Medicine Team Team Phone # [phone]

## 2021-12-25 ENCOUNTER — Inpatient Hospital Stay: Payer: Medicare HMO

## 2021-12-25 DIAGNOSIS — I5032 Chronic diastolic (congestive) heart failure: Secondary | ICD-10-CM | POA: Diagnosis not present

## 2021-12-25 DIAGNOSIS — K6289 Other specified diseases of anus and rectum: Secondary | ICD-10-CM | POA: Diagnosis not present

## 2021-12-25 DIAGNOSIS — K5900 Constipation, unspecified: Secondary | ICD-10-CM | POA: Diagnosis not present

## 2021-12-25 LAB — GLUCOSE, CAPILLARY: Glucose-Capillary: 100 mg/dL — ABNORMAL HIGH (ref 70–99)

## 2021-12-25 LAB — BASIC METABOLIC PANEL
Anion gap: 7 (ref 5–15)
BUN: 7 mg/dL — ABNORMAL LOW (ref 8–23)
CO2: 22 mmol/L (ref 22–32)
Calcium: 8.6 mg/dL — ABNORMAL LOW (ref 8.9–10.3)
Chloride: 110 mmol/L (ref 98–111)
Creatinine, Ser: 1.07 mg/dL (ref 0.61–1.24)
GFR, Estimated: >60 mL/min (ref >60)
Glucose, Bld: 101 mg/dL — ABNORMAL HIGH (ref 70–99)
Potassium: 3.2 mmol/L — ABNORMAL LOW (ref 3.5–5.1)
Sodium: 139 mmol/L (ref 135–145)

## 2021-12-25 MED ORDER — POTASSIUM CHLORIDE 10 MEQ/100ML IV SOLN
10.0000 meq | INTRAVENOUS | Status: AC
  Administered 2021-12-25 (×4): 10 meq via INTRAVENOUS
  Filled 2021-12-25 (×3): qty 100

## 2021-12-25 MED ORDER — ENSURE ENLIVE PO LIQD
237.0000 mL | Freq: Three times a day (TID) | ORAL | Status: DC
  Administered 2021-12-25 – 2021-12-26 (×2): 237 mL via ORAL

## 2021-12-25 MED ORDER — POLYETHYLENE GLYCOL 3350 17 G PO PACK
17.0000 g | PACK | Freq: Two times a day (BID) | ORAL | Status: DC
  Administered 2021-12-25 – 2021-12-26 (×2): 17 g via ORAL
  Filled 2021-12-25 (×2): qty 1

## 2021-12-25 MED ORDER — SENNOSIDES-DOCUSATE SODIUM 8.6-50 MG PO TABS
2.0000 | ORAL_TABLET | Freq: Two times a day (BID) | ORAL | Status: DC
  Administered 2021-12-25 – 2021-12-26 (×3): 2 via ORAL
  Filled 2021-12-25 (×3): qty 2

## 2021-12-25 NOTE — Progress Notes (Signed)
Nutrition Follow Up Note   DOCUMENTATION CODES:   Non-severe (moderate) malnutrition in context of chronic illness  INTERVENTION:   Ensure Enlive po TID, each supplement provides 350 kcal and 20 grams of protein.  Magic cup TID with meals, each supplement provides 290 kcal and 9 grams of protein  MVI po daily   Pt at high refeed risk; recommend monitor potassium, magnesium and phosphorus labs daily until stable  Assistance with meals   NUTRITION DIAGNOSIS:   Moderate Malnutrition related to chronic illness as evidenced by mild fat depletion, moderate muscle depletion.  GOAL:   Patient will meet greater than or equal to 90% of their needs -not met   MONITOR:   PO intake, Supplement acceptance, Labs, Weight trends, Skin, I & O's  ASSESSMENT:   81 y/o male with h/o prostate cancer, BPH, HTN, CAD, dementia, depression, CHF and lives in a group home who is admitted with abdominal pain and diarrhea.  Pt continues to have poor appetite and oral intake in hospital. Pt has remained on NPO/clear liquid diet since admission and is now without adequate nutrition for 5 days. Pt advanced to a full liquid diet today. Pt did eat 50% of his breakfast this morning which included ice cream and grits. RD will ad Ensure and Magi Cup supplements now that pt's diet has been advanced. Pt remains at high refeed risk.  Per chart, pt [place] up ~4lbs since admit but appears to be back at his UBW. Palliative care following; plan is to d/c home with outpatient palliative services.   Medications reviewed and include: vitamin C, aspirin, B12, colace, heparin, melatonin, MVI, protonix, miralax, senokot, Kcl, ceftriaxone, metronidazole   Labs reviewed: K 3.2(L) P 3.4 wnl, Mg 1.8 wnl- 9/28 Cbgs- 100, 97, 41 x 48 hrs   Diet Order:   Diet Order             Diet full liquid Room service appropriate? Yes; Fluid consistency: Thin  Diet effective 0500                  EDUCATION NEEDS:   No education  needs have been identified at this time  Skin:  Skin Assessment: Reviewed RN Assessment  Last BM:  9/26- type 4  Height:   Ht Readings from Last 1 Encounters:  12/20/21 6' (1.829 m)    Weight:   Wt Readings from Last 1 Encounters:  12/23/21 82.2 kg    Ideal Body Weight:  80.9 kg  BMI:  Body mass index is 24.58 kg/m.  Estimated Nutritional Needs:   Kcal:  2000-2300kcal/day  Protein:  100-115g/day  Fluid:  2.0-2.3L/day  Koleen Distance MS, RD, LDN Please refer to [facility] for RD and/or RD on-call/weekend/after hours pager

## 2021-12-25 NOTE — Care Management Important Message (Signed)
Important Message  Patient Details  Name: Alex [name] MRN: [id number] Date of Birth: [birth year]   Medicare Important Message Given:  Yes     Dannette Barbara 12/25/2021, 11:02 AM

## 2021-12-25 NOTE — Progress Notes (Signed)
  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  Seen by general surgery who recommended fecal disimpaction, but no surgical intervention.  Patient seen by palliative care, with patient's wife wishing to treat immediate problems and take home as soon as she can.  Assessment and Plan: Obstipation Ogilvie syndrome. Repeated x-rays today still show significant colonic dilatation, which will be chronic in nature now.  Patient has not had a bowel movement today.  I will schedule senna and MiraLAX twice a day to make sure patient has a bowel movement daily.  Avoid lactulose as it can increase the abdominal distention.   Proctitis Completed antibiotics.   Chronic diastolic CHF (congestive heart failure) ([facility]) 2D echo on 11/21/2017 showed EF 75% with grade 1 diastolic dysfunction.   Patient does not have any volume overload.   Hypertension -Amlodipine   Hypokalemia Appears to be persistent.  We will give additional dose of IV potassium today.  Check potassium, magnesium and phosphorus tomorrow.   CAD (coronary artery disease) Stable.   Hypothermia-resolved as of 12/23/2021 Secondary to illness.  Resolved.   HLD (hyperlipidemia)  Pravastatin   BPH (benign prostatic hyperplasia) Proscar   Senile dementia with behavioral disturbance ([facility]) Condition has been stable, no agitation.   Malnutrition of moderate degree Continue protein supplements.      Subjective:  Patient has baseline confusion, currently does not have any abdominal pain nausea vomiting.  Physical Exam: Vitals:   12/24/21 0757 12/24/21 2017 12/25/21 0222  12/25/21 0756  BP: (!) 145/91 133/88 (!) 135/92 124/89  Pulse: 77 63 77 81  Resp: '18 20 20 20  '$ Temp: (!) 97.4 F (36.3 C) 97.8 F (36.6 C) 97.6 F (36.4 C) 97.6 F (36.4 C)  TempSrc: Oral     SpO2: 97% 99% 95% 97%  Weight:      Height:       General exam: Appears calm and comfortable  Respiratory system: Clear to auscultation. Respiratory effort normal. Cardiovascular system: S1 & S2 heard, RRR. No JVD, murmurs, rubs, gallops or clicks. No pedal edema. Gastrointestinal system: Abdomen is distended, soft and nontender. No organomegaly or masses felt. Normal bowel sounds heard. Central nervous system: Alert and oriented x2. No focal neurological deficits. Extremities: Symmetric 5 x 5 power. Skin: No rashes, lesions or ulcers Psychiatry: Mood & affect appropriate.   Data Reviewed:  Reviewed abdominal films, lab results.  Family Communication: Wife at bedside, updated.  Disposition: Status is: Inpatient Remains inpatient appropriate because: Severity of disease,  Planned Discharge Destination: Home with Home Health    Time spent: 35 minutes  Author: Sharen Hones, MD 12/25/2021 2:22 PM  For on call review [email or web address].

## 2021-12-25 NOTE — Progress Notes (Signed)
[facility])  Outpatient palliative care referral received.  [facility] will f/u with patient and family once discharged.  Venia Carbon DNP, RN [facility] Liaison

## 2021-12-25 NOTE — TOC Progression Note (Signed)
Transition of Care [facility]) - Progression Note    Patient Details  Name: Alex [name] MRN: [id number] Date of Birth: Jul 08, 1940  Transition of Care [facility]) CM/SW [place], [place] Phone Number: 12/25/2021, 2:21 PM  Clinical Narrative:     MD notified CSW that discharge is held until tomorrow as pt is not medically ready. Pt has a signed DNR on the chart and will DC home when ready. Spouse aware. TOC will continue to follow.       Expected Discharge Plan and Services                                                 Social Determinants of Health (SDOH) Interventions    Readmission Risk Interventions     No data to display

## 2021-12-26 DIAGNOSIS — K5900 Constipation, unspecified: Secondary | ICD-10-CM | POA: Diagnosis not present

## 2021-12-26 DIAGNOSIS — F03918 Unspecified dementia, unspecified severity, with other behavioral disturbance: Secondary | ICD-10-CM | POA: Diagnosis not present

## 2021-12-26 DIAGNOSIS — I5032 Chronic diastolic (congestive) heart failure: Secondary | ICD-10-CM | POA: Diagnosis not present

## 2021-12-26 DIAGNOSIS — K6289 Other specified diseases of anus and rectum: Secondary | ICD-10-CM | POA: Diagnosis not present

## 2021-12-26 LAB — BASIC METABOLIC PANEL
Anion gap: 7 (ref 5–15)
BUN: 9 mg/dL (ref 8–23)
CO2: 25 mmol/L (ref 22–32)
Calcium: 8.7 mg/dL — ABNORMAL LOW (ref 8.9–10.3)
Chloride: 109 mmol/L (ref 98–111)
Creatinine, Ser: 1.22 mg/dL (ref 0.61–1.24)
GFR, Estimated: 60 mL/min — ABNORMAL LOW (ref >60)
Glucose, Bld: 106 mg/dL — ABNORMAL HIGH (ref 70–99)
Potassium: 3.8 mmol/L (ref 3.5–5.1)
Sodium: 141 mmol/L (ref 135–145)

## 2021-12-26 LAB — PHOSPHORUS: Phosphorus: 3.5 mg/dL (ref 2.5–4.6)

## 2021-12-26 LAB — GLUCOSE, CAPILLARY: Glucose-Capillary: 99 mg/dL (ref 70–99)

## 2021-12-26 LAB — MAGNESIUM: Magnesium: 2 mg/dL (ref 1.7–2.4)

## 2021-12-26 MED ORDER — POLYETHYLENE GLYCOL 3350 17 G PO PACK: 17.0000 g | PACK | Freq: Two times a day (BID) | ORAL | 0 refills | Status: DC

## 2021-12-26 MED ORDER — CEPHALEXIN 500 MG PO CAPS: 500.0000 mg | ORAL_CAPSULE | Freq: Three times a day (TID) | ORAL | 0 refills | Status: AC

## 2021-12-26 MED ORDER — SENNOSIDES-DOCUSATE SODIUM 8.6-50 MG PO TABS: 2.0000 | ORAL_TABLET | Freq: Two times a day (BID) | ORAL | 0 refills | Status: DC

## 2021-12-26 MED ORDER — METRONIDAZOLE 500 MG PO TABS: 500.0000 mg | ORAL_TABLET | Freq: Three times a day (TID) | ORAL | 0 refills | Status: AC

## 2021-12-26 NOTE — Progress Notes (Signed)
Alex [name] to be D/C'd Home per MD order.  Discussed prescriptions and follow up appointments with the patient. Prescriptions given to patient, medication list explained in detail. Pt verbalized understanding.  Allergies as of 12/26/2021       Reactions   Donepezil Diarrhea   Penicillins Other (See Comments), Itching   Unknown reaction, childhood  Has patient had a PCN reaction causing immediate rash, facial/tongue/throat swelling, SOB or lightheadedness with hypotension: Yes Has patient had a PCN reaction causing severe rash involving mucus membranes or skin necrosis: No Has patient had a PCN reaction that required hospitalization: No Has patient had a PCN reaction occurring within the last 10 years: No If all of the above answers are "NO", then may proceed with Cephalosporin use.   Penicillin G Rash        Medication List     TAKE these medications    acetaminophen 325 MG tablet Commonly known as: TYLENOL Take 650 mg by mouth every 6 (six) hours as needed for mild pain.   amLODipine 5 MG tablet Commonly known as: NORVASC Take 5 mg by mouth daily.   ascorbic acid 500 MG tablet Commonly known as: VITAMIN C Take 500 mg by mouth daily.   aspirin EC 81 MG tablet Take 81 mg by mouth daily.   cephALEXin 500 MG capsule Commonly known as: KEFLEX Take 1 capsule (500 mg total) by mouth 3 (three) times daily for 2 days.   cyanocobalamin 1000 MCG tablet Commonly known as: VITAMIN B12 Take 1,000 mcg by mouth daily.   DULoxetine 30 MG capsule Commonly known as: Cymbalta Take 1 capsule (30 mg total) by mouth daily.   eucerin cream Apply 1 application. topically as needed for dry skin.   finasteride 5 MG tablet Commonly known as: Proscar Take 1 tablet (5 mg total) by mouth daily.   melatonin 3 MG Tabs tablet Take 3 mg by mouth at bedtime.   metroNIDAZOLE 500 MG tablet Commonly known as: Flagyl Take 1 tablet (500 mg total) by mouth 3 (three) times daily for 2 days.    multivitamin with minerals Tabs tablet Take 1 tablet by mouth daily.   pantoprazole 20 MG tablet Commonly known as: PROTONIX Take 20 mg by mouth daily.   polyethylene glycol 17 g packet Commonly known as: MIRALAX / GLYCOLAX Take 17 g by mouth 2 (two) times daily.   pravastatin 80 MG tablet Commonly known as: PRAVACHOL Take 80 mg by mouth daily.   senna-docusate 8.6-50 MG tablet Commonly known as: Senokot-S Take 2 tablets by mouth 2 (two) times daily.   sildenafil 100 MG tablet Commonly known as: VIAGRA Take 50 mg by mouth daily as needed for erectile dysfunction.   sodium chloride 0.65 % Soln nasal spray Commonly known as: OCEAN Place 1 spray into both nostrils as needed for congestion.               Durable Medical Equipment  (From admission, onward)           Start     Ordered   12/22/21 1523  For home use only DME Other see comment  Once       Comments: Harrel Lemon lift  Question:  Length of Need  Answer:  Lifetime   12/22/21 1522            Vitals:   12/26/21 0323 12/26/21 0816  BP: 135/88 118/83  Pulse: 79 79  Resp: 18 14  Temp: 98.6 F (37 C) 98.6 F (37  C)  SpO2: 97% 99%    Skin clean, dry and intact without evidence of skin break down, no evidence of skin tears noted. IV catheter discontinued intact. Site without signs and symptoms of complications. Dressing and pressure applied. Pt denies pain at this time. No complaints noted.  An After Visit Summary was printed and given to the patient. Patient escorted via stretcher, and D/C home via EMS  [place]. Alex [name]

## 2021-12-26 NOTE — Discharge Summary (Signed)
Physician Discharge Summary   Patient: Alex [name] MRN: [id number] DOB: 05/15/1940  Admit date:     12/20/2021  Discharge date: 12/26/21  Discharge Physician: Sharen Hones   PCP: [facility]   Recommendations at discharge:   Follow-up with PCP in 1 week. Continue follow-up with palliative care. Continue stool softeners to make sure has 1 bowel movement every day.  Discharge Diagnoses: Active Problems:   Obstipation   Proctitis   Chronic diastolic CHF (congestive heart failure) (HCC)   Hypertension   Hypokalemia   CAD (coronary artery disease)   HLD (hyperlipidemia)   BPH (benign prostatic hyperplasia)   Senile dementia with behavioral disturbance (HCC)   Depression   Malnutrition of moderate degree Hypoglycemia. 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  Seen by general surgery who recommended fecal disimpaction, but no surgical intervention.  Patient seen by palliative care, with patient's wife wishing to treat immediate problems and take home as soon as she can. Patient has been having daily bowel movements, abdominal distention seem to be better.  We will continue stool softeners, make sure he has 1 bowel movement every day.  Overall long-term prognosis is poor, but medically stable to be discharged.  Assessment and Plan: Obstipation Ogilvie syndrome. Repeated x-rays today still show significant colonic dilatation, which will be chronic in nature now.  Patient has been treated with stool softeners including MiraLAX twice a day and senna twice a day, patient has been having daily bowel movements.  Repeated abdominal film yesterday appears to be stable.  Medically stable to be discharged.  Avoid lactulose as it can  increase the abdominal distention. Patient be followed by GI as outpatient.   Proctitis Will complete 7 days of antibiotics currently with oral Keflex and Flagyl.   Chronic diastolic CHF (congestive heart failure) (HCC) 2D echo on 11/21/2017 showed EF 75% with grade 1 diastolic dysfunction.   Patient does not have any volume overload.   Hypertension Amlodipine   Hypokalemia Potassium has normalized, magnesium and phosphorus also normal.   CAD (coronary artery disease) Stable.   Hypothermia-resolved as of 12/23/2021 Secondary to illness.  Resolved.   HLD (hyperlipidemia)  Pravastatin   BPH (benign prostatic hyperplasia) Proscar   Senile dementia with behavioral disturbance (HCC) Condition has been stable, no agitation.   Malnutrition of moderate degree Encourage protein intake.   Hypoglycemia. Patient had a hypoglycemia due to poor p.o. intake, that has resolved as patient p.o. intakes better.       Consultants: General surgery. Procedures performed: None  Disposition: Home health Diet recommendation:  Discharge Diet Orders (From admission, onward)     Start     Ordered   12/26/21 0000  Diet - low sodium heart healthy        12/26/21 0948           Cardiac diet DISCHARGE MEDICATION: Allergies as of 12/26/2021       Reactions   Donepezil Diarrhea   Penicillins Other (See Comments), Itching   Unknown reaction, childhood  Has patient had a PCN reaction causing immediate rash, facial/tongue/throat swelling, SOB or lightheadedness with hypotension: Yes Has patient had a PCN reaction causing severe rash involving mucus membranes or skin necrosis: No Has patient had a PCN reaction that required hospitalization: No Has patient  had a PCN reaction occurring within the last 10 years: No If all of the above answers are "NO", then may proceed with Cephalosporin use.   Penicillin G Rash        Medication List     TAKE these medications    acetaminophen 325  MG tablet Commonly known as: TYLENOL Take 650 mg by mouth every 6 (six) hours as needed for mild pain.   amLODipine 5 MG tablet Commonly known as: NORVASC Take 5 mg by mouth daily.   ascorbic acid 500 MG tablet Commonly known as: VITAMIN C Take 500 mg by mouth daily.   aspirin EC 81 MG tablet Take 81 mg by mouth daily.   cephALEXin 500 MG capsule Commonly known as: KEFLEX Take 1 capsule (500 mg total) by mouth 3 (three) times daily for 2 days.   cyanocobalamin 1000 MCG tablet Commonly known as: VITAMIN B12 Take 1,000 mcg by mouth daily.   DULoxetine 30 MG capsule Commonly known as: Cymbalta Take 1 capsule (30 mg total) by mouth daily.   eucerin cream Apply 1 application. topically as needed for dry skin.   finasteride 5 MG tablet Commonly known as: Proscar Take 1 tablet (5 mg total) by mouth daily.   melatonin 3 MG Tabs tablet Take 3 mg by mouth at bedtime.   metroNIDAZOLE 500 MG tablet Commonly known as: Flagyl Take 1 tablet (500 mg total) by mouth 3 (three) times daily for 2 days.   multivitamin with minerals Tabs tablet Take 1 tablet by mouth daily.   pantoprazole 20 MG tablet Commonly known as: PROTONIX Take 20 mg by mouth daily.   polyethylene glycol 17 g packet Commonly known as: MIRALAX / GLYCOLAX Take 17 g by mouth 2 (two) times daily.   pravastatin 80 MG tablet Commonly known as: PRAVACHOL Take 80 mg by mouth daily.   senna-docusate 8.6-50 MG tablet Commonly known as: Senokot-S Take 2 tablets by mouth 2 (two) times daily.   sildenafil 100 MG tablet Commonly known as: VIAGRA Take 50 mg by mouth daily as needed for erectile dysfunction.   sodium chloride 0.65 % Soln nasal spray Commonly known as: OCEAN Place 1 spray into both nostrils as needed for congestion.               Durable Medical Equipment  (From admission, onward)           Start     Ordered   12/22/21 1523  For home use only DME Other see comment  Once        Comments: Harrel Lemon lift  Question:  Length of Need  Answer:  Lifetime   12/22/21 1522            Follow-up Information     [facility].   Why: They will follow up with you for your home health needs. Please contact Malachy Mood with any questions/concerns: [phone]. Contact information: [place] [address] [place] Follow up in 1 week(s).   Specialty: General Practice Contact information: [address] [place] [address] [phone]         Jonathon Bellows, MD Follow up in 2 week(s).   Specialty: Gastroenterology Contact information: [place] [address] [phone]                Discharge Exam: Danley Danker Weights   12/22/21 0500 12/23/21 0453 12/26/21  1275  Weight: 82.4 kg 82.2 kg 82.8 kg   General exam: Appears calm and comfortable  Respiratory system: Clear to auscultation. Respiratory effort normal. Cardiovascular system: S1 & S2 heard, RRR. No JVD, murmurs, rubs, gallops or clicks. No pedal edema. Gastrointestinal system: Abdomen is nondistended, soft and nontender. No organomegaly or masses felt. Normal bowel sounds heard. Central nervous system: Alert and oriented x1. No focal neurological deficits. Extremities: Symmetric 5 x 5 power. Skin: No rashes, lesions or ulcers Psychiatry:  Mood & affect appropriate.    Condition at discharge: fair  The results of significant diagnostics from this hospitalization (including imaging, microbiology, ancillary and laboratory) are listed below for reference.   Imaging Studies: DG Abd 1 View  Result Date: 12/25/2021 CLINICAL DATA:  Abdominal distension. EXAM: ABDOMEN - 1 VIEW COMPARISON:  December 21, 2021. FINDINGS: There is increased small bowel dilatation concerning for distal small bowel obstruction or ileus. Continued colonic distention is noted. IMPRESSION: Continued colonic distention. Increased  small bowel dilatation is noted concerning for distal small bowel obstruction or ileus. Electronically Signed   By: Marijo Conception M.D.   On: 12/25/2021 10:36   DG ABD ACUTE 2+V W 1V CHEST  Result Date: 12/21/2021 CLINICAL DATA:  Ogilvie syndrome. EXAM: DG ABDOMEN ACUTE WITH 1 VIEW CHEST COMPARISON:  CT abdomen and pelvis 12/20/2021. Chest radiographs 07/21/2019. FINDINGS: The cardiomediastinal silhouette is within normal limits. Lung volumes are low with asymmetric elevation of the left hemidiaphragm. Mild opacities are present in the lung bases and right mid lung. No sizable pleural effusion or pneumothorax is identified. No intraperitoneal free air is identified. Gaseous distension of the colon, greatest in the region of the sigmoid colon, is similar to the recent CT. A small to moderate amount of stool is present in the colon and rectum. No significant small bowel dilatation is evident. No acute osseous abnormality is seen. IMPRESSION: 1. Unchanged colonic distension. 2. Low lung volumes with mild bibasilar and right mid lung opacities, likely atelectasis although early infection is not excluded. Electronically Signed   By: Logan Bores M.D.   On: 12/21/2021 08:22   CT ABDOMEN PELVIS W CONTRAST  Result Date: 12/20/2021 CLINICAL DATA:  Diarrhea, abdominal pain, and altered mental status EXAM: CT ABDOMEN AND PELVIS WITH CONTRAST TECHNIQUE: Multidetector CT imaging of the abdomen and pelvis was performed using the standard protocol following bolus administration of intravenous contrast. RADIATION DOSE REDUCTION: This exam was performed according to the departmental dose-optimization program which includes automated exposure control, adjustment of the mA and/or kV according to patient size and/or use of iterative reconstruction technique. CONTRAST:  187m OMNIPAQUE IOHEXOL 300 MG/ML  SOLN COMPARISON:  CT 08/29/2021 FINDINGS: Lower chest: Coronary atherosclerosis. No pericardial effusion. Elevated left  hemidiaphragm with mild mass effect upon the left ventricle. Bibasilar atelectasis. Hepatobiliary: Benign-appearing cyst in the right hepatic lobe. No follow-up required. No gallstones, gallbladder wall thickening, or biliary ductal dilation. Pancreas: Unremarkable. No pancreatic ductal dilatation or surrounding inflammatory changes. Spleen: Normal in size without focal abnormality. Adrenals/Urinary Tract: Unremarkable adrenal glands. Low-attenuation lesions in the kidneys are statistically likely to represent cysts. No follow-up is required. No hydronephrosis or urinary calculi. Unremarkable bladder. Stomach/Bowel: Stomach is within normal limits given respiratory motion. Normal caliber small bowel. Diffuse dilation of predominantly air-filled loops of large bowel greatest in the sigmoid colon. This has slightly increased since 08/29/2021 and measures proximally 9.0 cm in maximum diameter. Large amount of dense liquid stool within the rectum with air-fluid level. Circumferential wall  thickening of rectum. Vascular/Lymphatic: Aortic atherosclerosis. No enlarged abdominal or pelvic lymph nodes. Reproductive: Unremarkable prostate. Large right-sided hydrocele is unchanged. Other: No free intraperitoneal air. Musculoskeletal: No acute osseous abnormality. IMPRESSION: Increased marked distention of the large bowel greatest about the sigmoid colon when compared with 08/29/2021. There is hyperdense layering stool within the rectum. Fecal impaction can not be excluded. Wall thickening about the rectum suggestive of proctitis. Large right-sided hydrocele. Aortic Atherosclerosis (ICD10-I70.0). Electronically Signed   By: Placido Sou M.D.   On: 12/20/2021 02:31    Microbiology: Results for orders placed or performed during the hospital encounter of 08/15/16  Urine culture     Status: None   Collection Time: 08/15/16  7:19 PM   Specimen: Urine, Random  Result Value Ref Range Status   Specimen Description URINE,  RANDOM  Final   Special Requests NONE  Final   Culture   Final    NO GROWTH Performed at [facility], [place] [address] [place], [place] [address]    Report Status 08/17/2016 FINAL  Final    Labs: CBC: Recent Labs  Lab 12/20/21 0015 12/21/21 0607 12/22/21 0639 12/23/21 0702  WBC 7.8 5.7 5.6 5.4  NEUTROABS 4.7  --   --   --   HGB 14.0 12.8* 13.9 13.0  HCT 42.4 38.5* 41.9 38.8*  MCV 90.8 89.5 90.1 90.4  PLT 173 126* 144* 948*   Basic Metabolic Panel: Recent Labs  Lab 12/20/21 0015 12/21/21 0607 12/22/21 0639 12/23/21 0702 12/24/21 0430 12/25/21 0721 12/26/21 0746  NA 143   < > 139 141 140 139 141  K 2.8*   < > 2.9* 3.0* 2.7* 3.2* 3.8  CL 105   < > 104 107 106 110 109  CO2 29   < > '27 27 28 22 25  '$ GLUCOSE 117*   < > 88 98 97 101* 106*  BUN 18   < > '9 11 8 '$ 7* 9  CREATININE 1.14   < > 0.96 1.11 1.04 1.07 1.22  CALCIUM 9.2   < > 9.0 8.6* 8.5* 8.6* 8.7*  MG 2.0  --  1.9 1.8 1.8  --  2.0  PHOS  --   --  2.7 3.2 3.4  --  3.5   < > = values in this interval not displayed.   Liver Function Tests: Recent Labs  Lab 12/20/21 0015  AST 18  ALT 8  ALKPHOS 78  BILITOT 0.7  PROT 7.4  ALBUMIN 4.0   CBG: Recent Labs  Lab 12/23/21 1626 12/24/21 0753 12/24/21 0755 12/25/21 1124 12/26/21 0819  GLUCAP 91 41* 97 100* 99    Discharge time spent: greater than 30 minutes.  Signed: Sharen Hones, MD [facility] 12/26/2021

## 2021-12-26 NOTE — TOC Transition Note (Signed)
Transition of Care [facility]) - CM/SW Discharge Note   Patient Details  Name: Carvell Hoeffner MRN: [id number] Date of Birth: 22-Apr-1940  [facility] CM/SW Contact:  Elliot Gurney [place], [place] Phone Number: 12/26/2021, 10:56 AM   Clinical Narrative:     Phone call to patient's spouse to confirm need for EMS transport home. EMS contact at 10:57am. Patient second in line for transport home. RN notified.  [address], LCSW Transition of Care [phone]         Patient Goals and CMS Choice        Discharge Placement                       Discharge Plan and Services                                     Social Determinants of Health (SDOH) Interventions     Readmission Risk Interventions     No data to display

## 2021-12-26 NOTE — Progress Notes (Addendum)
Patient is oriented only to self. Scrotum is enlarged. Wife at bedside wanted patient to wear diapers brought from home. Vitals for 1900/2000 are not in. Consulted with tech whom stated she obtained the vitals but that they did not go over from dynamap into system. No pain indicators present. Asleep for midnight neuro check.Took meds crushed in applesauce. Patient becomes agitated when turning him. Resists and pushes against staff-better with a 2-3 assist when turning or doing peri care. Changed diaper this morning and placed a barrier ointment on buttocks. Family denied additional needs. Will continue care plan.

## 2022-03-12 ENCOUNTER — Emergency Department: Payer: Medicare Other

## 2022-03-12 ENCOUNTER — Other Ambulatory Visit: Payer: Self-pay

## 2022-03-12 ENCOUNTER — Emergency Department
Admission: EM | Admit: 2022-03-12 | Discharge: 2022-03-12 | Disposition: A | Discharge status: Home / Self Care | Payer: Medicare Other | Attending: Student in an Organized Health Care Education/Training Program | Admitting: Student in an Organized Health Care Education/Training Program

## 2022-03-12 DIAGNOSIS — F0390 Unspecified dementia without behavioral disturbance: Secondary | ICD-10-CM | POA: Diagnosis not present

## 2022-03-12 DIAGNOSIS — M25551 Pain in right hip: Secondary | ICD-10-CM | POA: Diagnosis not present

## 2022-03-12 MED ORDER — LIDOCAINE 5 % EX PTCH
1.0000 | MEDICATED_PATCH | CUTANEOUS | Status: DC
  Administered 2022-03-12: 1 via TRANSDERMAL
  Filled 2022-03-12: qty 1

## 2022-03-12 NOTE — ED Notes (Signed)
Patient's family at bedside. Made aware of upcoming discharge home. EMS contacted at this time for transport.

## 2022-03-12 NOTE — ED Triage Notes (Signed)
Pt to ED via [facility] from home for right leg injury. Pt has right leg external rotation, no falls or known injuries. Pts leg started hurting today. Pt is on hospice. Pt has hx/o dementia and is at his baseline per family. Pt VSS with EMS.

## 2022-03-12 NOTE — ED Notes (Signed)
EMS arrived to transport patient back to residence at this time

## 2022-03-12 NOTE — ED Provider Notes (Signed)
[facility] Provider Note    Event Date/Time   First MD Initiated Contact with Patient 03/12/22 1804     (approximate)   History   Leg Injury   HPI  Alex [name] is a 81 y.o. male on hospice presents to the ER for evaluation of reported right hip and leg pain.  No reported trauma.  No fevers or chills.  No nausea or vomiting.  Last physical therapy session was last week.  Patient with advanced dementia with most history provided by wife.     Physical Exam   Triage Vital Signs: ED Triage Vitals [03/12/22 1749]  Enc Vitals Group     BP (!) 132/102     Pulse Rate 67     Resp 16     Temp 97.6 F (36.4 C)     Temp Source Axillary     SpO2 97 %     Weight      Height      Head Circumference      Peak Flow      Pain Score      Pain Loc      Pain Edu?      Excl. in [place]?     Most recent vital signs: Vitals:   03/12/22 1749  BP: (!) 132/102  Pulse: 67  Resp: 16  Temp: 97.6 F (36.4 C)  SpO2: 97%     Constitutional: Alert,in nad Eyes: Conjunctivae are normal.  Head: Atraumatic. Nose: No congestion/rhinnorhea. Mouth/Throat: Mucous membranes are moist.   Neck: Painless ROM.  Cardiovascular:   Good peripheral circulation. Respiratory: Normal respiratory effort.  No retractions.  Gastrointestinal: Soft and nontender in all four quadrants Musculoskeletal:  no deformity.  Compartments are soft.  Good distal perfusion.  Neurovascular intact.  No pain with logroll.  Able to passively and actively range right hip and knee.  No pelvic instability. Neurologic:  MAE spontaneously. No gross focal neurologic deficits are appreciated.  Skin:  Skin is warm, dry and intact. No rash noted. Psychiatric: Mood and affect are normal. Speech and behavior are normal.    ED Results / Procedures / Treatments   Labs (all labs ordered are listed, but only abnormal results are displayed) Labs Reviewed - No data to display   EKG     RADIOLOGY Please  see ED Course for my review and interpretation.  I personally reviewed all radiographic images ordered to evaluate for the above acute complaints and reviewed radiology reports and findings.  These findings were personally discussed with the patient.  Please see medical record for radiology report.    PROCEDURES:  Critical Care performed: No  Procedures   MEDICATIONS ORDERED IN ED: Medications  lidocaine (LIDODERM) 5 % 1 patch (has no administration in time range)     IMPRESSION / MDM / ASSESSMENT AND PLAN / ED COURSE  I reviewed the triage vital signs and the nursing notes.                              Differential diagnosis includes, but is not limited to, fracture, contusion, arthritis, bursitis, compartment syndrome, vascular injury or abnormality,  Patient here for evaluation of right hip and leg pain.  No obvious deformity.  His exam is reassuring but given his age risk factors imaging will be ordered for the blood differential.  Does not appear to be any acute distress.  Not consistent with limb ischemia compartment  syndrome.  No sign of overlying infectious process.  Clinical Course as of 03/12/22 1956  Fri Mar 12, 2022  1828 X-ray my review and interpretation does not show any evidence of fracture or contusion. [PR]  1902 CT imaging on my review and interpretation does not show any evidence of hip fracture. [PR]  1953 Patient reassessed imaging results discussed with family.  His abdominal exam is benign at this time.  Review of records does appear the patient has been diagnosed with Ogilvie syndrome.  Not having any symptoms obstructive pattern.  He is still passing gas and moving his bowels.  On repeat examination he is able to fully range his right hip.  Is not consistent with a septic arthritis or bursitis.  Possible musculoskeletal strain.  No report of any falls with no evidence of fracture.  Discussed additional options for pain management and diagnostic test including  blood work urinalysis patient's wife agreeable to receiving Lidoderm patch and deferring any further workup at this time.  Feels comfortable with taking patient home as he is on hospice. [PR]    Clinical Course User Index [PR] Merlyn Lot, MD      FINAL CLINICAL IMPRESSION(S) / ED DIAGNOSES   Final diagnoses:  Right hip pain     Rx / DC Orders   ED Discharge Orders     None        Note:  This document was prepared using Dragon voice recognition software and may include unintentional dictation errors.    Merlyn Lot, MD 03/12/22 [phone]

## 2022-04-29 DEATH — deceased
